# Patient Record
Sex: Female | Born: 1990 | Race: Black or African American | Hispanic: No | Marital: Single | Smoking: Never smoker
Health system: Southern US, Community
[De-identification: ages and names within clinical notes are randomized; demographics above are authoritative.]

## PROBLEM LIST (undated history)

## (undated) ENCOUNTER — Inpatient Hospital Stay (HOSPITAL_COMMUNITY): Payer: Self-pay

## (undated) DIAGNOSIS — B009 Herpesviral infection, unspecified: Secondary | ICD-10-CM

## (undated) DIAGNOSIS — N390 Urinary tract infection, site not specified: Secondary | ICD-10-CM

## (undated) DIAGNOSIS — M3500 Sicca syndrome, unspecified: Secondary | ICD-10-CM

## (undated) DIAGNOSIS — IMO0002 Reserved for concepts with insufficient information to code with codable children: Secondary | ICD-10-CM

## (undated) DIAGNOSIS — R87619 Unspecified abnormal cytological findings in specimens from cervix uteri: Secondary | ICD-10-CM

## (undated) DIAGNOSIS — D649 Anemia, unspecified: Secondary | ICD-10-CM

## (undated) DIAGNOSIS — N760 Acute vaginitis: Secondary | ICD-10-CM

## (undated) DIAGNOSIS — A599 Trichomoniasis, unspecified: Secondary | ICD-10-CM

## (undated) DIAGNOSIS — R7989 Other specified abnormal findings of blood chemistry: Secondary | ICD-10-CM

## (undated) DIAGNOSIS — S83519A Sprain of anterior cruciate ligament of unspecified knee, initial encounter: Secondary | ICD-10-CM

## (undated) DIAGNOSIS — B999 Unspecified infectious disease: Secondary | ICD-10-CM

## (undated) DIAGNOSIS — R87629 Unspecified abnormal cytological findings in specimens from vagina: Secondary | ICD-10-CM

## (undated) DIAGNOSIS — B9689 Other specified bacterial agents as the cause of diseases classified elsewhere: Secondary | ICD-10-CM

## (undated) HISTORY — DX: Other specified abnormal findings of blood chemistry: R79.89

## (undated) HISTORY — PX: COLPOSCOPY: SHX161

## (undated) HISTORY — DX: Reserved for concepts with insufficient information to code with codable children: IMO0002

## (undated) HISTORY — DX: Unspecified infectious disease: B99.9

## (undated) HISTORY — DX: Acute vaginitis: N76.0

## (undated) HISTORY — DX: Other specified bacterial agents as the cause of diseases classified elsewhere: B96.89

## (undated) HISTORY — DX: Urinary tract infection, site not specified: N39.0

## (undated) HISTORY — DX: Trichomoniasis, unspecified: A59.9

## (undated) HISTORY — DX: Unspecified abnormal cytological findings in specimens from cervix uteri: R87.619

## (undated) HISTORY — DX: Sprain of anterior cruciate ligament of unspecified knee, initial encounter: S83.519A

---
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
## 2010-03-03 ENCOUNTER — Emergency Department (HOSPITAL_COMMUNITY): Admission: EM | Admit: 2010-03-03 | Discharge: 2010-03-03 | Payer: Self-pay | Admitting: Family Medicine

## 2010-09-21 LAB — URINE CULTURE

## 2010-09-21 LAB — WET PREP, GENITAL: Yeast Wet Prep HPF POC: NONE SEEN

## 2010-09-21 LAB — POCT URINALYSIS DIPSTICK
Ketones, ur: NEGATIVE mg/dL
Nitrite: POSITIVE — AB
Protein, ur: NEGATIVE mg/dL
Specific Gravity, Urine: 1.015 (ref 1.005–1.030)
pH: 8.5 — ABNORMAL HIGH (ref 5.0–8.0)

## 2010-09-21 LAB — GC/CHLAMYDIA PROBE AMP, GENITAL: GC Probe Amp, Genital: NEGATIVE

## 2010-09-23 LAB — WET PREP, GENITAL
Clue Cells Wet Prep HPF POC: NONE SEEN
Yeast Wet Prep HPF POC: NONE SEEN

## 2010-09-23 LAB — POCT URINALYSIS DIP (DEVICE)
Glucose, UA: NEGATIVE mg/dL
Nitrite: NEGATIVE
Specific Gravity, Urine: 1.015 (ref 1.005–1.030)

## 2010-09-23 LAB — URINE CULTURE: Colony Count: 60000

## 2010-09-23 LAB — GC/CHLAMYDIA PROBE AMP, GENITAL
Chlamydia, DNA Probe: NEGATIVE
GC Probe Amp, Genital: NEGATIVE

## 2010-11-23 NOTE — Op Note (Signed)
Pamela Erickson, Pamela Erickson               ACCOUNT NO.:  [id number]   MEDICAL RECORD NO.:  [id number]          PATIENT TYPE:  AMB   LOCATION:  DSC                          FACILITY:  [facility]   PHYSICIAN:  Robert A. Thurston Hole, M.D. DATE OF BIRTH:  [birth year]   DATE OF PROCEDURE:  10/13/2006  DATE OF DISCHARGE:                               OPERATIVE REPORT   PREOPERATIVE DIAGNOSES:  1. Left knee anterior cruciate ligament tear.  2. Left knee partial lateral meniscal tear.   POSTOPERATIVE DIAGNOSES:  1. Left knee anterior cruciate ligament tear.  2. Left knee partial lateral meniscal tear.   PROCEDURES:  1. Left knee examination under anesthesia, followed by      arthroscopically assisted endoscopic bone-patellar tendon-bone      autograft anterior cruciate ligament reconstruction using 8 x 25-mm      femoral interference BioScrew and 8 x 25-mm tibial interference      BioScrew.  2. Left knee partial lateral meniscectomy.   SURGEON:  Elana Alm. Thurston Hole, M.D.   ASSISTANT:  Julien Girt, P.A.   ANESTHESIA:  General.   OPERATIVE TIME:  1 hour 15 minutes.   COMPLICATIONS:  None.   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  Significant pain with exam and MRI documenting ACL tear.  She is now to  undergo arthroscopy with ACL reconstruction.   DESCRIPTION:  Pamela Erickson was brought to the operating room on 10/13/06, after a  femoral nerve block was placed in the holding area by Anesthesia.  She  was placed on the operating table in supine position.  After being  placed under general anesthesia, her left knee was examined.  She had a  full range of motion, 2 to 3+ Lachman, positive pivot shift, knee stable  to varus, valgus and posterior stress, with normal patellar tracking.  The left knee was sterilely injected with 0.25% Marcaine with  epinephrine.  She received Ancef 1 g IV preoperatively  for prophylaxis.  The left leg was then prepped using sterile DuraPrep and draped using  sterile technique.  Originally, through an anterolateral portal, the  arthroscope with a pump attached was placed, and through an anteromedial  portal, an arthroscopic probe was placed.  On initial inspection of the  medial compartment, the articular cartilage was normal.  The medial  meniscus was normal.  The intercondylar notch was inspected.  The  anterior cruciate ligament was completely torn in its midsubstance with  significant anterior laxity, and this was thoroughly debrided and a  notchplasty was performed.  The posterior cruciate was intact and  stable.  The lateral compartment was inspected.  She had grade 1 and 2  chondromalacia.  The lateral meniscus showed a small posteromedial root  tear; 20% was resected back to a stable rim.  The rest of the lateral  meniscus was intact.  At the patellofemoral joint, the articular  cartilage was normal and the patella tracked normally.  The medial and  lateral gutters were free of pathology.  She  then underwent an ACL  autograft harvesting with a 4 to 5-cm longitudinal incision based over  the patellar tendon.  The underlying subcutaneous tissues were incised  along the skin incision.  The patellar tendon was exposed.  It measured  32 mm in width, and the central 10 mm was harvested with 9 x 25 mm of  patellar bone and tibial tubercle bone, and a 10-mm tendon graft.  After  this was done and while I was preparing the intra-articular portion of  the knee, Pamela Erickson, whose assistance was absolutely medically  necessary, was preparing the ACL graft on the back table to  significantly reduce the operative time and improve efficiency.  A 1.5-  cm anteromedial proximal tibial incision was made, and using a tibial  drill guide, a Steinmann pin was drilled up in the ACL insertion portion  on the tibial plateau with a 9-mm drill.  Through this hole,  the  posterior femoral guide was placed in the posterior femoral notch, and  then a Steinmann pin drilled up in the ACL origin point and then  overdrilled with a 9-mm drill to a depth of 30 mm, leaving a posterior 2-  mm bone bridge.  A double-pin passer was brought up through the tibial  tunnel and joint, and up through the femoral tunnel and through the  femoral cortex and thigh through a stab wound.  This was used to pass  the ACL graft, which had been prepared on the back table by [employer], up through the tibial tunnel, joint, and into the femoral  tunnel joint.  It was locked in position there with an 8 x 25-mm  interference BioScrew.  The knee was then brought through a full range  of motion and there was no impingement of the graft.  The tibial bone  plug was then locked into its tunnel with an 8 x 25-mm interference  BioScrew with the knee in 30 degrees of flexion and the tibia held  reduced on the femur.  After this was done, the knee was tested for  stability.  Lachman and pivot shift were found to be totally eliminated,  and the knee could be brought through a full range of motion with no  impingement of the graft.  At this point, it was felt that all pathology  had been satisfactorily addressed.  The patellar tendon defect was  closed loosely with a 2-0 Vicryl, subcutaneous tissue was closed with 0  and 2-0 Vicryl, subcuticular layer closed with 4-0 Monocryl, and the  arthroscopic portals were closed with 3-0 nylon.  The wound was injected  with 0.25% Marcaine with epinephrine.  Sterile dressings and a long-leg  splint were applied, and the patient was awakened and taken to the  recovery room in stable condition.   FOLLOWUP CARE:  Pamela Erickson will be followed [facility] for pain control, neurovascular monitoring and CPMs.  Discharged tomorrow on Percocet, and Robaxin when at home, CPM.  See me back in the  office in a week for sutures out and  followup.      Robert A. Thurston Hole, M.D.  Electronically Signed     RAW/MEDQ  D:  10/13/2006  T:  10/13/2006  Job:  [id number]

## 2011-04-05 LAB — WET PREP, GENITAL: Trich, Wet Prep: NONE SEEN

## 2011-04-15 LAB — POCT RAPID STREP A: Streptococcus, Group A Screen (Direct): NEGATIVE

## 2012-07-25 ENCOUNTER — Emergency Department (HOSPITAL_COMMUNITY)
Admission: EM | Admit: 2012-07-25 | Discharge: 2012-07-26 | Disposition: A | Discharge status: Home / Self Care | Payer: BC Managed Care – PPO | Attending: Emergency Medicine | Admitting: Emergency Medicine

## 2012-07-25 ENCOUNTER — Encounter (HOSPITAL_COMMUNITY): Payer: Self-pay | Admitting: *Deleted

## 2012-07-25 DIAGNOSIS — F17200 Nicotine dependence, unspecified, uncomplicated: Secondary | ICD-10-CM | POA: Insufficient documentation

## 2012-07-25 DIAGNOSIS — R079 Chest pain, unspecified: Secondary | ICD-10-CM

## 2012-07-25 DIAGNOSIS — R0602 Shortness of breath: Secondary | ICD-10-CM | POA: Insufficient documentation

## 2012-07-25 NOTE — ED Notes (Signed)
New EKG given to Dr Ignacia Palma. No old EKG.

## 2012-07-25 NOTE — ED Notes (Signed)
Patient reports she woke up today with this epigastric discomfort.  States laying down or leaning forward makes it worse.  Lungs clear bilaterally

## 2012-07-25 NOTE — ED Notes (Signed)
Chest tightness - substernal along with burning. Had spicy food - red hot chips.  Have to catch breath when bending over some.

## 2012-07-26 ENCOUNTER — Emergency Department (HOSPITAL_COMMUNITY): Payer: BC Managed Care – PPO

## 2012-07-26 LAB — POCT I-STAT, CHEM 8
Creatinine, Ser: 1.1 mg/dL (ref 0.50–1.10)
Glucose, Bld: 82 mg/dL (ref 70–99)
Hemoglobin: 13.3 g/dL (ref 12.0–15.0)

## 2012-07-26 MED ORDER — NAPROXEN 500 MG PO TABS: 500.0000 mg | ORAL_TABLET | Freq: Two times a day (BID) | ORAL | Status: DC

## 2012-07-26 MED ORDER — NAPROXEN 250 MG PO TABS
500.0000 mg | ORAL_TABLET | Freq: Once | ORAL | Status: AC
  Administered 2012-07-26: 500 mg via ORAL
  Filled 2012-07-26: qty 2

## 2012-07-26 NOTE — ED Provider Notes (Signed)
History     CSN: [id number]  Arrival date & time 07/25/12  2123   First MD Initiated Contact with Patient 07/25/12 2350      Chief Complaint  Patient presents with  . Shortness of Breath  . Chest Pain    (Consider location/radiation/quality/duration/timing/severity/associated sxs/prior treatment) HPI Comments: 22 year old female with no significant past medical history who presents with a complaint of one day of chest pain which is tightness, worse when she leans forward and associated with some shortness of breath.  She does have a recent car trip to [place] which took 5-6 hours and has also injured her left lower extremity on the calf muscle but does not remember how. She denies swelling of the legs, recent surgery, oral contraceptive use but she is a smoker. Her pain is intermittent throughout the day, seems to be worse when she leans forward, better when she lays back and not associated with fevers or coughing. She has no other medical problems, has no history of deep venous thrombosis and no family history of hypercoagulable state  Patient is a 22 y.o. female presenting with shortness of breath and chest pain. The history is provided by the patient.  Shortness of Breath  Associated symptoms include chest pain and shortness of breath.  Chest Pain Primary symptoms include shortness of breath.     History reviewed. No pertinent past medical history.  History reviewed. No pertinent past surgical history.  No family history on file.  History  Substance Use Topics  . Smoking status: Current Every Day Smoker  . Smokeless tobacco: Not on file  . Alcohol Use: Yes    OB History    Grav Para Term Preterm Abortions TAB SAB Ect Mult Living                  Review of Systems  Respiratory: Positive for shortness of breath.   Cardiovascular: Positive for chest pain.  All other systems reviewed and are negative.    Allergies  Review of patient's allergies indicates no known  allergies.  Home Medications   Current Outpatient Rx  Name  Route  Sig  Dispense  Refill  . NAPROXEN 500 MG PO TABS   Oral   Take 1 tablet (500 mg total) by mouth 2 (two) times daily with a meal.   30 tablet   0     BP 125/79  Pulse 101  Temp 98.2 F (36.8 C) (Oral)  Resp 25  SpO2 100%  LMP 07/08/2012  Physical Exam  Nursing note and vitals reviewed. Constitutional: She appears well-developed and well-nourished. No distress.  HENT:  Head: Normocephalic and atraumatic.  Mouth/Throat: Oropharynx is clear and moist. No oropharyngeal exudate.  Eyes: Conjunctivae normal and EOM are normal. Pupils are equal, round, and reactive to light. Right eye exhibits no discharge. Left eye exhibits no discharge. No scleral icterus.  Neck: Normal range of motion. Neck supple. No JVD present. No thyromegaly present.  Cardiovascular: Normal rate, regular rhythm, normal heart sounds and intact distal pulses.  Exam reveals no gallop and no friction rub.   No murmur heard. Pulmonary/Chest: Effort normal and breath sounds normal. No respiratory distress. She has no wheezes. She has no rales.  Abdominal: Soft. Bowel sounds are normal. She exhibits no distension and no mass. There is no tenderness.  Musculoskeletal: Normal range of motion. She exhibits tenderness ( Mild tenderness to the left medial lower There is a single bruise). She exhibits no edema.  No asymmetry, no edema of the lower extremities  Lymphadenopathy:    She has no cervical adenopathy.  Neurological: She is alert. Coordination normal.  Skin: Skin is warm and dry. No rash noted. No erythema.  Psychiatric: She has a normal mood and affect. Her behavior is normal.    ED Course  Procedures (including critical care time)   Labs Reviewed  D-DIMER, QUANTITATIVE  POCT I-STAT, CHEM 8   Dg Chest 2 View  07/26/2012  *RADIOLOGY REPORT*  Clinical Data: Shortness of breath  CHEST - 2 VIEW  Comparison: None.  Findings: Shallow  inspiration with some elevation of the left hemidiaphragm. The heart size and pulmonary vascularity are normal. The lungs appear clear and expanded without focal air space disease or consolidation. No blunting of the costophrenic angles.  No pneumothorax.  Mediastinal contours appear intact.  IMPRESSION: No evidence of active pulmonary disease.   Original Report Authenticated By: Burman Nieves, M.D.      1. Chest pain       MDM  The patient's pulse is approximately 100, she has recent travel, recent injury to the left lower extremity and warns a d-dimer to rule out pulmonary embolism though less likely given the patient's overall well appearance. There is no signs of right heart strain clinically nor on EKG, chest x-ray pending.  ED ECG REPORT  I personally interpreted this EKG   Date: 07/26/2012   Rate: 100  Rhythm: normal sinus rhythm  QRS Axis: normal  Intervals: normal  ST/T Wave abnormalities: nonspecific T wave changes  Conduction Disutrbances:none  Narrative Interpretation:   Old EKG Reviewed: none available  At this time the patient is well-appearing, chest x-ray was negative for any acute findings and a d-dimer was normal.        Vida Roller, MD 07/26/12 [phone]

## 2012-09-21 ENCOUNTER — Ambulatory Visit: Payer: BC Managed Care – PPO | Admitting: Obstetrics and Gynecology

## 2012-09-21 LAB — POCT URINALYSIS DIPSTICK
Blood, UA: NEGATIVE
Ketones, UA: NEGATIVE
Spec Grav, UA: 1.015
Urobilinogen, UA: NEGATIVE
pH, UA: 7

## 2012-09-21 NOTE — Progress Notes (Unsigned)
NOB INTERVIEW. Pt given information regarding 1st trimester screen and to call to schedule with NOB W/U  if desires. NOB W/u with Alvino Chapel 10/02/12

## 2012-09-22 LAB — PRENATAL PANEL VII
Antibody Screen: NEGATIVE
Basophils Absolute: 0 10*3/uL (ref 0.0–0.1)
Basophils Relative: 0 % (ref 0–1)
Eosinophils Relative: 0 % (ref 0–5)
Hemoglobin: 11.8 g/dL — ABNORMAL LOW (ref 12.0–15.0)
Lymphocytes Relative: 19 % (ref 12–46)
Lymphs Abs: 1.1 10*3/uL (ref 0.7–4.0)
MCHC: 33.9 g/dL (ref 30.0–36.0)
Monocytes Absolute: 0.7 10*3/uL (ref 0.1–1.0)
Monocytes Relative: 13 % — ABNORMAL HIGH (ref 3–12)
Neutrophils Relative %: 68 % (ref 43–77)
Platelets: 472 10*3/uL — ABNORMAL HIGH (ref 150–400)
RBC: 4.56 MIL/uL (ref 3.87–5.11)
RDW: 14.8 % (ref 11.5–15.5)
Rubella: 1.69 Index — ABNORMAL HIGH (ref <0.90)

## 2012-09-23 LAB — HEMOGLOBINOPATHY EVALUATION
Hemoglobin Other: 0 %
Hgb S Quant: 34.9 % — ABNORMAL HIGH

## 2012-09-24 LAB — CULTURE, OB URINE

## 2012-10-07 ENCOUNTER — Inpatient Hospital Stay (HOSPITAL_COMMUNITY)
Admission: AD | Admit: 2012-10-07 | Discharge: 2012-10-07 | Disposition: A | Discharge status: Home / Self Care | Payer: BC Managed Care – PPO | Source: Ambulatory Visit | Attending: Obstetrics and Gynecology | Admitting: Obstetrics and Gynecology

## 2012-10-07 ENCOUNTER — Inpatient Hospital Stay (HOSPITAL_COMMUNITY): Payer: BC Managed Care – PPO

## 2012-10-07 ENCOUNTER — Encounter (HOSPITAL_COMMUNITY): Payer: Self-pay | Admitting: *Deleted

## 2012-10-07 DIAGNOSIS — O209 Hemorrhage in early pregnancy, unspecified: Secondary | ICD-10-CM | POA: Diagnosis present

## 2012-10-07 DIAGNOSIS — A6000 Herpesviral infection of urogenital system, unspecified: Secondary | ICD-10-CM | POA: Diagnosis present

## 2012-10-07 DIAGNOSIS — O98519 Other viral diseases complicating pregnancy, unspecified trimester: Secondary | ICD-10-CM | POA: Insufficient documentation

## 2012-10-07 HISTORY — DX: Herpesviral infection, unspecified: B00.9

## 2012-10-07 HISTORY — DX: Herpesviral infection of urogenital system, unspecified: A60.00

## 2012-10-07 LAB — WET PREP, GENITAL
Trich, Wet Prep: NONE SEEN
Yeast Wet Prep HPF POC: NONE SEEN

## 2012-10-07 LAB — URINE MICROSCOPIC-ADD ON

## 2012-10-07 LAB — URINALYSIS, ROUTINE W REFLEX MICROSCOPIC
Glucose, UA: NEGATIVE mg/dL
Nitrite: NEGATIVE
Urobilinogen, UA: 0.2 mg/dL (ref 0.0–1.0)
pH: 6.5 (ref 5.0–8.0)

## 2012-10-07 MED ORDER — VALACYCLOVIR HCL 500 MG PO TABS: 500.0000 mg | ORAL_TABLET | Freq: Two times a day (BID) | ORAL | Status: AC

## 2012-10-07 NOTE — MAU Note (Signed)
Last intercourse was 6 days ago; denies any pain and noted some spotting when she wiped this AM; has not been on meds for Herpes and is having an outbreak now- last outbreak was about a year ago;

## 2012-10-07 NOTE — MAU Note (Signed)
C/o spotting this AM and having a herpes outbreak;

## 2012-10-07 NOTE — MAU Provider Note (Signed)
History   22 yo G1P0 at 49 4/7 weeks presented unannounced c/o BRB this am after voiding--denies pain, no recent IC. Also thinks she has a current HSV outbreak, but having no pain.  Visible lesions noted since Sunday.  Hx of frequent outbreaks after initial dx several years ago, then diminished to occasional.  This is first outbreak with pregnancy.  Had NOB interview at [facility], with GC/chlamydia pending from urine.  Had NOB appt scheduled XXX, but did not keep due to insurance issues.  Those issues have now been resolved--needs to make another appt for NOB w/u.  A+ type from NOB labs.   Chief Complaint  Patient presents with  . Vaginal Bleeding     OB History   Grav Para Term Preterm Abortions TAB SAB Ect Mult Living   1               Past Medical History  Diagnosis Date  . Abnormal Pap smear     last pap 01/2012  . 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 Torn ACL (anterior cruciate ligament) 2008  . Herpes     Past Surgical History  Procedure Laterality Date  . Anterior cruciate ligament repair  2008    Family History  Problem Relation Age of Onset  . Hypertension Mother   . Hypertension Father   . Diabetes Maternal Aunt     History  Substance Use Topics  . Smoking status: Never Smoker   . Smokeless tobacco: Never Used  . Alcohol Use: Yes     Comment: OCC    Allergies: No Known Allergies  Prescriptions prior to admission  Medication Sig Dispense Refill  . Prenatal Vit-Fe Fumarate-FA (PRENATAL PO) Take 1 each by mouth daily. gummie        Physical Exam   Blood pressure 140/65, pulse 92, temperature 98.8 F (37.1 C), temperature source Oral, weight 198 lb 12.8 oz (90.175 kg), last menstrual period 07/11/2012.  Chest clear Heart RRR without murmur Abd gravid, NT, FH 12-13 weeks Pelvic--small ulceration on right labia, NT.  Small amount pink d/c in vault, no active bleeding.  Cervix closed, long, NT. Ext WNL  FHR 160 by doppler  ED Course  IUP at 12  4/7 weeks 1st trimester bleeding HSV outbreak  Plan: Wet prep [place] for dating/evaluation of bleeding Reviewed PN labs with patient. Anticipate d/c after [place], with bleeding precautions.   Nigel Bridgeman CNM, [place] 10/07/2012 9:39 AM   Addendum: Returned from [place]:  12 4/7 weeks, no evidence SCH.  No further bleeding. No pain.  Results for orders placed during the hospital encounter of 10/07/12 (from the past 24 hour(s))  URINALYSIS, ROUTINE W REFLEX MICROSCOPIC     Status: Abnormal   Collection Time    10/07/12  8:36 AM      Result Value Range   Color, Urine YELLOW  YELLOW   APPearance CLEAR  CLEAR   Specific Gravity, Urine 1.015  1.005 - 1.030   pH 6.5  5.0 - 8.0   Glucose, UA NEGATIVE  NEGATIVE mg/dL   Hgb urine dipstick MODERATE (*) NEGATIVE   Bilirubin Urine NEGATIVE  NEGATIVE   Ketones, ur NEGATIVE  NEGATIVE mg/dL   Protein, ur NEGATIVE  NEGATIVE mg/dL   Urobilinogen, UA 0.2  0.0 - 1.0 mg/dL   Nitrite NEGATIVE  NEGATIVE   Leukocytes, UA MODERATE (*) NEGATIVE  URINE MICROSCOPIC-ADD ON     Status: Abnormal   Collection Time  10/07/12  8:36 AM      Result Value Range   Squamous Epithelial / LPF RARE  RARE   WBC, UA 3-6  <3 WBC/hpf   Bacteria, UA FEW (*) RARE  WET PREP, GENITAL     Status: Abnormal   Collection Time    10/07/12  9:42 AM      Result Value Range   Yeast Wet Prep HPF POC NONE SEEN  NONE SEEN   Trich, Wet Prep NONE SEEN  NONE SEEN   Clue Cells Wet Prep HPF POC FEW (*) NONE SEEN   WBC, Wet Prep HPF POC FEW (*) NONE SEEN     D/C's home with bleeding precautions. Needs to schedule appt for NOB w/u--will have office call her to schedule it with me, if possible.  Nigel Bridgeman, CNM 10/07/12 10a

## 2012-10-08 LAB — URINE CULTURE

## 2013-03-05 ENCOUNTER — Encounter (HOSPITAL_COMMUNITY): Payer: Self-pay | Admitting: Emergency Medicine

## 2013-03-05 ENCOUNTER — Emergency Department (INDEPENDENT_AMBULATORY_CARE_PROVIDER_SITE_OTHER)
Admission: EM | Admit: 2013-03-05 | Discharge: 2013-03-05 | Disposition: A | Discharge status: Home / Self Care | Payer: BC Managed Care – PPO | Source: Home / Self Care | Attending: Emergency Medicine | Admitting: Emergency Medicine

## 2013-03-05 DIAGNOSIS — B370 Candidal stomatitis: Secondary | ICD-10-CM

## 2013-03-05 MED ORDER — NYSTATIN 100000 UNIT/ML MT SUSP: 500000.0000 [IU] | Freq: Four times a day (QID) | OROMUCOSAL | Status: AC

## 2013-03-05 NOTE — ED Provider Notes (Addendum)
CSN: [id number]     Arrival date & time 03/05/13  1432 History   First MD Initiated Contact with Patient 03/05/13 1530     Chief Complaint  Patient presents with  . Mouth Lesions   (Consider location/radiation/quality/duration/timing/severity/associated sxs/prior Treatment) HPI Comments: Patient presents urgent care complaining of 2-3 white patches on her tongue some of them have subsided some but she still has a 1 circular shaped and the tip of her tongue with white and raised edges. She denies any pain or tongue trauma or injury she is not a smoker but she is pregnant.  Patient is a 22 y.o. female presenting with mouth sores. The history is provided by the patient.  Mouth Lesions Location:  Tongue Quality:  White Onset quality:  Gradual Severity:  Mild Progression:  Worsening Relieved by:  Nothing Worsened by:  Nothing tried Associated symptoms: no fever and no rash     Past Medical History  Diagnosis Date  . Abnormal Pap smear     last pap 01/2012  . 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 Torn ACL (anterior cruciate ligament) 2008  . Herpes    Past Surgical History  Procedure Laterality Date  . Anterior cruciate ligament repair  2008   Family History  Problem Relation Age of Onset  . Hypertension Mother   . Hypertension Father   . Diabetes Maternal Aunt    History  Substance Use Topics  . Smoking status: Never Smoker   . Smokeless tobacco: Never Used  . Alcohol Use: Yes     Comment: OCC   OB History   Grav Para Term Preterm Abortions TAB SAB Ect Mult Living   1              Review of Systems  Constitutional: Negative for fever, chills, diaphoresis, activity change, appetite change, fatigue and unexpected weight change.  HENT: Positive for mouth sores.   Eyes: Negative for discharge and itching.  Skin: Negative for color change, pallor, rash and wound.    Allergies  Review of patient's allergies indicates no known allergies.  Home Medications    Current Outpatient Rx  Name  Route  Sig  Dispense  Refill  . Prenatal Vitamins (DIS) TABS   Oral   Take by mouth.         . Prenatal Vit-Fe Fumarate-FA (PRENATAL PO)   Oral   Take 1 each by mouth daily. gummie          BP 110/69  Pulse 102  Temp(Src) 98.5 F (36.9 C) (Oral)  Resp 22  SpO2 99%  LMP 07/11/2012 Physical Exam  Nursing note and vitals reviewed. Constitutional: She appears well-developed and well-nourished.  HENT:  Head: Normocephalic and atraumatic.  Mouth/Throat: No oropharyngeal exudate.    Eyes: Conjunctivae are normal.  Neck: No JVD present.  Skin: No rash noted. No erythema.    ED Course  Procedures (including critical care time) Labs Review Labs Reviewed - No data to display Imaging Review No results found.  MDM  No diagnosis found. Oral candidiasis patient will be treated with nystatin suspension.    Jimmie Molly, MD 03/05/13 1607  Jimmie Molly, MD 03/05/13 [phone]

## 2013-03-05 NOTE — ED Notes (Signed)
Patient reports white patches on tongue.  Visualized slightly white circular area to tip of tongue.  Denies any pain or other concerns.  Patient is [redacted] weeks pregnant-dr stringer.

## 2013-03-22 LAB — OB RESULTS CONSOLE GC/CHLAMYDIA: Gonorrhea: NEGATIVE

## 2013-03-22 LAB — OB RESULTS CONSOLE GBS: GBS: NEGATIVE

## 2013-04-20 ENCOUNTER — Telehealth (HOSPITAL_COMMUNITY): Payer: Self-pay | Admitting: *Deleted

## 2013-04-20 ENCOUNTER — Encounter (HOSPITAL_COMMUNITY): Payer: Self-pay | Admitting: *Deleted

## 2013-04-20 NOTE — Telephone Encounter (Signed)
Preadmission screen  

## 2013-04-24 ENCOUNTER — Encounter (HOSPITAL_COMMUNITY): Payer: Self-pay

## 2013-04-24 ENCOUNTER — Inpatient Hospital Stay (HOSPITAL_COMMUNITY)
Admission: RE | Admit: 2013-04-24 | Discharge: 2013-04-28 | DRG: 766 | Disposition: A | Discharge status: Home / Self Care | Payer: BC Managed Care – PPO | Source: Ambulatory Visit | Attending: Obstetrics and Gynecology | Admitting: Obstetrics and Gynecology

## 2013-04-24 VITALS — BP 127/74 | HR 105 | Temp 97.7°F | Resp 18 | Ht 62.0 in | Wt 225.0 lb

## 2013-04-24 DIAGNOSIS — O480 Post-term pregnancy: Principal | ICD-10-CM | POA: Diagnosis present

## 2013-04-24 DIAGNOSIS — Z98891 History of uterine scar from previous surgery: Secondary | ICD-10-CM

## 2013-04-24 HISTORY — DX: Anemia, unspecified: D64.9

## 2013-04-24 LAB — TYPE AND SCREEN: Antibody Screen: NEGATIVE

## 2013-04-24 LAB — CBC
HCT: 33.7 % — ABNORMAL LOW (ref 36.0–46.0)
Hemoglobin: 11.6 g/dL — ABNORMAL LOW (ref 12.0–15.0)
MCV: 75.2 fL — ABNORMAL LOW (ref 78.0–100.0)
RDW: 14.8 % (ref 11.5–15.5)
WBC: 6.1 10*3/uL (ref 4.0–10.5)

## 2013-04-24 MED ORDER — LIDOCAINE HCL (PF) 1 % IJ SOLN
30.0000 mL | INTRAMUSCULAR | Status: DC | PRN
  Filled 2013-04-24: qty 30

## 2013-04-24 MED ORDER — OXYTOCIN BOLUS FROM INFUSION: 500.0000 mL | INTRAVENOUS | Status: DC

## 2013-04-24 MED ORDER — OXYCODONE-ACETAMINOPHEN 5-325 MG PO TABS: 1.0000 | ORAL_TABLET | ORAL | Status: DC | PRN

## 2013-04-24 MED ORDER — ACETAMINOPHEN 325 MG PO TABS: 650.0000 mg | ORAL_TABLET | ORAL | Status: DC | PRN

## 2013-04-24 MED ORDER — ZOLPIDEM TARTRATE 5 MG PO TABS
5.0000 mg | ORAL_TABLET | Freq: Every evening | ORAL | Status: DC | PRN
  Administered 2013-04-24: 5 mg via ORAL
  Filled 2013-04-24: qty 1

## 2013-04-24 MED ORDER — IBUPROFEN 600 MG PO TABS: 600.0000 mg | ORAL_TABLET | Freq: Four times a day (QID) | ORAL | Status: DC | PRN

## 2013-04-24 MED ORDER — MISOPROSTOL 25 MCG QUARTER TABLET
25.0000 ug | ORAL_TABLET | ORAL | Status: DC | PRN
  Administered 2013-04-24 – 2013-04-25 (×2): 25 ug via VAGINAL
  Filled 2013-04-24 (×2): qty 0.25
  Filled 2013-04-24: qty 1

## 2013-04-24 MED ORDER — FLEET ENEMA 7-19 GM/118ML RE ENEM: 1.0000 | ENEMA | RECTAL | Status: DC | PRN

## 2013-04-24 MED ORDER — NALBUPHINE SYRINGE 5 MG/0.5 ML
5.0000 mg | INJECTION | INTRAMUSCULAR | Status: DC | PRN
  Administered 2013-04-25: 5 mg via INTRAVENOUS
  Filled 2013-04-24 (×2): qty 0.5

## 2013-04-24 MED ORDER — TERBUTALINE SULFATE 1 MG/ML IJ SOLN: 0.2500 mg | Freq: Once | INTRAMUSCULAR | Status: AC | PRN

## 2013-04-24 MED ORDER — ONDANSETRON HCL 4 MG/2ML IJ SOLN: 4.0000 mg | Freq: Four times a day (QID) | INTRAMUSCULAR | Status: DC | PRN

## 2013-04-24 MED ORDER — LACTATED RINGERS IV SOLN
500.0000 mL | INTRAVENOUS | Status: DC | PRN
  Administered 2013-04-25: 500 mL via INTRAVENOUS

## 2013-04-24 MED ORDER — LACTATED RINGERS IV SOLN
INTRAVENOUS | Status: DC
  Administered 2013-04-25 (×3): via INTRAVENOUS

## 2013-04-24 MED ORDER — OXYTOCIN 40 UNITS IN LACTATED RINGERS INFUSION - SIMPLE MED: 62.5000 mL/h | INTRAVENOUS | Status: DC

## 2013-04-24 MED ORDER — CITRIC ACID-SODIUM CITRATE 334-500 MG/5ML PO SOLN
30.0000 mL | ORAL | Status: DC | PRN
  Administered 2013-04-25: 30 mL via ORAL
  Filled 2013-04-24: qty 15

## 2013-04-24 MED ORDER — OXYTOCIN 40 UNITS IN LACTATED RINGERS INFUSION - SIMPLE MED
1.0000 m[IU]/min | INTRAVENOUS | Status: DC
  Administered 2013-04-25: 2 m[IU]/min via INTRAVENOUS
  Filled 2013-04-24: qty 1000

## 2013-04-24 NOTE — Progress Notes (Signed)
Pulse oximeter removed, FHR tracing without difficulty

## 2013-04-24 NOTE — H&P (Signed)
Pamela Erickson is a 22 y.o. female presenting for induction of labor at [redacted]w[redacted]d due to post dates. History OB History   Grav Para Term Preterm Abortions TAB SAB Ect Mult Living   1              History of Present Pregnancy:   Pt entered care at 10w 2d.  EDC confirmed by [place].  Pt with neg quad screen and anatomy ultrasound without abnormalities identified.  Pt with nml 1hr GTT.  Ultrasound obtained at 34wks due to size greater than dates with EFW at 63%ile and normal amniotic fluid volume.  On Valtrex proplylaxis starting at 36wks and denies any recent outbreaks.    Past Medical History  Diagnosis Date  . Abnormal Pap smear     last pap 01/2012  . 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 Torn ACL (anterior cruciate ligament) 2008  . Herpes    Past Surgical History  Procedure Laterality Date  . Anterior cruciate ligament repair  2008  . No past surgeries     Family History: family history includes Diabetes in her maternal aunt; Hypertension in her father and mother. Social History:  reports that she has never smoked. She has never used smokeless tobacco. She reports that she drinks alcohol. She reports that she uses illicit drugs.   Prenatal Transfer Tool  Maternal Diabetes: No Genetic Screening: Normal Maternal Ultrasounds/Referrals: Normal Fetal Ultrasounds or other Referrals:  None Maternal Substance Abuse:  No Significant Maternal Medications:  None Significant Maternal Lab Results:  None Other Comments:  None  Review of Systems  Constitutional: Negative.   HENT: Negative.   Eyes: Negative.   Respiratory: Negative.   Cardiovascular: Negative.   Gastrointestinal: Negative.   Genitourinary: Negative.   Musculoskeletal: Negative.   Skin: Negative.   Neurological: Negative.   Endo/Heme/Allergies: Negative.   Psychiatric/Behavioral: Negative.     Dilation: 1.5 Effacement (%): 40 Station: -3 Exam by:: Conni Elliot, CNM Blood pressure 130/76, pulse 101, temperature 97.8  F (36.6 C), temperature source Oral, resp. rate 20, height 5\' 2"  (1.575 m), weight 102.059 kg (225 lb), last menstrual period 07/11/2012. Maternal Exam:  Uterine Assessment: Contraction strength is mild.  Contraction frequency is irregular.   Abdomen: Patient reports no abdominal tenderness. Fundal height is 40.   Estimated fetal weight is 7.5.   Fetal presentation: vertex  Introitus: Normal vulva. Vulva is negative for lesion.  Normal vagina.  Ferning test: not done.  Nitrazine test: not done. Amniotic fluid character: not assessed.  Pelvis: adequate for delivery.   Cervix: Cervix evaluated by sterile speculum exam and digital exam.     Fetal Exam Fetal Monitor Review: Mode: ultrasound.   Baseline rate: 155.  Variability: moderate (6-25 bpm).   Pattern: accelerations present and no decelerations.    Fetal State Assessment: Category I - tracings are normal.     Physical Exam  Constitutional: She is oriented to person, place, and time. She appears well-developed and well-nourished.  HENT:  Head: Normocephalic and atraumatic.  Right Ear: External ear normal.  Left Ear: External ear normal.  Nose: Nose normal.  Eyes: Conjunctivae are normal. Pupils are equal, round, and reactive to light.  Neck: Normal range of motion. Neck supple.  Cardiovascular: Normal rate, regular rhythm and intact distal pulses.   Respiratory: Effort normal and breath sounds normal.  GI: Soft. Bowel sounds are normal.  Gravid  Genitourinary: Vagina normal and uterus normal. Vulva exhibits no lesion.  Speculum exam with no evid of HSV lesions noted of ext genitalia or vagina.  Musculoskeletal: Normal range of motion.  Neurological: She is alert and oriented to person, place, and time. She has normal reflexes.  Skin: Skin is warm and dry.  Psychiatric: She has a normal mood and affect. Her behavior is normal.    Prenatal labs: ABO, Rh: A/POS/-- (03/17 1406) Antibody: NEG (03/17 1406) Rubella:  1.69 (03/17 1406) RPR: NON REAC (03/17 1406)  HBsAg: NEGATIVE (03/17 1406)  HIV: NON REACTIVE (03/17 1406)  GBS: Negative (09/15 0000)   Assessment/Plan: IUP at [redacted]w[redacted]d  Admit to [facility] per consult with Dr. Normand Sloop. RBA cytotec followed by pitocin d/w pt.  Pt agrees to proceed with cytotec tonight.   Will begin Pitocin in AM.     Kennady Zimmerle O. 04/24/2013, 8:46 PM

## 2013-04-24 NOTE — Progress Notes (Signed)
Maternal pulse tracing from 2241-2246.  Pulse oximeter applied to differentiate maternal vs fetal pulse.  [place] readjusted

## 2013-04-24 NOTE — Progress Notes (Signed)
Conni Elliot , CNM into perform speculum exam prior to initiating IOL secondary pt hx HSV

## 2013-04-25 ENCOUNTER — Encounter (HOSPITAL_COMMUNITY): Payer: Self-pay

## 2013-04-25 ENCOUNTER — Inpatient Hospital Stay (HOSPITAL_COMMUNITY): Payer: BC Managed Care – PPO | Admitting: Anesthesiology

## 2013-04-25 ENCOUNTER — Encounter (HOSPITAL_COMMUNITY): Payer: BC Managed Care – PPO | Admitting: Anesthesiology

## 2013-04-25 ENCOUNTER — Encounter (HOSPITAL_COMMUNITY)
Admission: RE | Disposition: A | Discharge status: Home / Self Care | Payer: Self-pay | Source: Ambulatory Visit | Attending: Obstetrics and Gynecology

## 2013-04-25 SURGERY — Surgical Case
Anesthesia: Epidural | Site: Abdomen | Wound class: Clean Contaminated

## 2013-04-25 MED ORDER — EPHEDRINE 5 MG/ML INJ
10.0000 mg | INTRAVENOUS | Status: DC | PRN
  Filled 2013-04-25: qty 2

## 2013-04-25 MED ORDER — MORPHINE SULFATE 0.5 MG/ML IJ SOLN
INTRAMUSCULAR | Status: AC
  Filled 2013-04-25: qty 10

## 2013-04-25 MED ORDER — LACTATED RINGERS IV SOLN
INTRAVENOUS | Status: DC | PRN
  Administered 2013-04-25: 20:00:00 via INTRAVENOUS

## 2013-04-25 MED ORDER — ONDANSETRON HCL 4 MG/2ML IJ SOLN
INTRAMUSCULAR | Status: DC | PRN
  Administered 2013-04-25: 4 mg via INTRAMUSCULAR

## 2013-04-25 MED ORDER — ONDANSETRON HCL 4 MG/2ML IJ SOLN
INTRAMUSCULAR | Status: AC
  Filled 2013-04-25: qty 2

## 2013-04-25 MED ORDER — LIDOCAINE HCL (PF) 1 % IJ SOLN
INTRAMUSCULAR | Status: DC | PRN
  Administered 2013-04-25 (×2): 5 mL

## 2013-04-25 MED ORDER — ACETAMINOPHEN 325 MG PO TABS
650.0000 mg | ORAL_TABLET | Freq: Once | ORAL | Status: AC
  Administered 2013-04-25: 650 mg via ORAL

## 2013-04-25 MED ORDER — LACTATED RINGERS IV SOLN
INTRAVENOUS | Status: DC
  Administered 2013-04-25: 18:00:00 via INTRAUTERINE

## 2013-04-25 MED ORDER — SCOPOLAMINE 1 MG/3DAYS TD PT72
MEDICATED_PATCH | TRANSDERMAL | Status: AC
  Filled 2013-04-25: qty 1

## 2013-04-25 MED ORDER — LIDOCAINE-EPINEPHRINE (PF) 2 %-1:200000 IJ SOLN
INTRAMUSCULAR | Status: AC
  Filled 2013-04-25: qty 20

## 2013-04-25 MED ORDER — METOCLOPRAMIDE HCL 5 MG/ML IJ SOLN: 10.0000 mg | Freq: Once | INTRAMUSCULAR | Status: AC | PRN

## 2013-04-25 MED ORDER — DIPHENHYDRAMINE HCL 50 MG/ML IJ SOLN: 12.5000 mg | INTRAMUSCULAR | Status: DC | PRN

## 2013-04-25 MED ORDER — MEPERIDINE HCL 25 MG/ML IJ SOLN: 6.2500 mg | INTRAMUSCULAR | Status: DC | PRN

## 2013-04-25 MED ORDER — OXYTOCIN 10 UNIT/ML IJ SOLN
INTRAMUSCULAR | Status: AC
  Filled 2013-04-25: qty 4

## 2013-04-25 MED ORDER — MEPERIDINE HCL 25 MG/ML IJ SOLN
INTRAMUSCULAR | Status: DC | PRN
  Administered 2013-04-25 (×2): 12.5 mg via INTRAVENOUS

## 2013-04-25 MED ORDER — LACTATED RINGERS IV SOLN
INTRAVENOUS | Status: DC | PRN
  Administered 2013-04-25 (×3): via INTRAVENOUS

## 2013-04-25 MED ORDER — MEPERIDINE HCL 25 MG/ML IJ SOLN
INTRAMUSCULAR | Status: AC
  Filled 2013-04-25: qty 1

## 2013-04-25 MED ORDER — KETOROLAC TROMETHAMINE 30 MG/ML IJ SOLN: 30.0000 mg | Freq: Four times a day (QID) | INTRAMUSCULAR | Status: AC | PRN

## 2013-04-25 MED ORDER — MORPHINE SULFATE (PF) 0.5 MG/ML IJ SOLN
INTRAMUSCULAR | Status: DC | PRN
  Administered 2013-04-25: 4 mg via EPIDURAL

## 2013-04-25 MED ORDER — FENTANYL 2.5 MCG/ML BUPIVACAINE 1/10 % EPIDURAL INFUSION (WH - ANES)
INTRAMUSCULAR | Status: AC
  Filled 2013-04-25: qty 125

## 2013-04-25 MED ORDER — SODIUM BICARBONATE 8.4 % IV SOLN
INTRAVENOUS | Status: DC | PRN
  Administered 2013-04-25: 5 mL via EPIDURAL
  Administered 2013-04-25: 2 mL via EPIDURAL
  Administered 2013-04-25: 5 mL via EPIDURAL
  Administered 2013-04-25: 3 mL via EPIDURAL
  Administered 2013-04-25: 5 mL via EPIDURAL

## 2013-04-25 MED ORDER — KETOROLAC TROMETHAMINE 30 MG/ML IJ SOLN
30.0000 mg | Freq: Four times a day (QID) | INTRAMUSCULAR | Status: AC | PRN
  Administered 2013-04-25: 30 mg via INTRAVENOUS
  Filled 2013-04-25: qty 1

## 2013-04-25 MED ORDER — LACTATED RINGERS IV SOLN: 500.0000 mL | Freq: Once | INTRAVENOUS | Status: DC

## 2013-04-25 MED ORDER — 0.9 % SODIUM CHLORIDE (POUR BTL) OPTIME
TOPICAL | Status: DC | PRN
  Administered 2013-04-25: 1000 mL

## 2013-04-25 MED ORDER — MORPHINE SULFATE (PF) 0.5 MG/ML IJ SOLN
INTRAMUSCULAR | Status: DC | PRN
  Administered 2013-04-25: 1 mg via INTRAVENOUS

## 2013-04-25 MED ORDER — SCOPOLAMINE 1 MG/3DAYS TD PT72
1.0000 | MEDICATED_PATCH | Freq: Once | TRANSDERMAL | Status: DC
  Administered 2013-04-25: 1.5 mg via TRANSDERMAL
  Filled 2013-04-25: qty 1

## 2013-04-25 MED ORDER — KETOROLAC TROMETHAMINE 30 MG/ML IJ SOLN
INTRAMUSCULAR | Status: AC
  Administered 2013-04-26: 30 mg
  Filled 2013-04-25: qty 1

## 2013-04-25 MED ORDER — PHENYLEPHRINE 40 MCG/ML (10ML) SYRINGE FOR IV PUSH (FOR BLOOD PRESSURE SUPPORT)
80.0000 ug | PREFILLED_SYRINGE | INTRAVENOUS | Status: DC | PRN
  Filled 2013-04-25: qty 2

## 2013-04-25 MED ORDER — FENTANYL CITRATE 0.05 MG/ML IJ SOLN: 25.0000 ug | INTRAMUSCULAR | Status: DC | PRN

## 2013-04-25 MED ORDER — SODIUM CHLORIDE 0.9 % IV SOLN
3.0000 g | Freq: Four times a day (QID) | INTRAVENOUS | Status: DC
  Administered 2013-04-25 – 2013-04-27 (×6): 3 g via INTRAVENOUS
  Filled 2013-04-25 (×7): qty 3

## 2013-04-25 MED ORDER — LACTATED RINGERS IV SOLN
INTRAVENOUS | Status: DC
  Administered 2013-04-25: 125 mL/h via INTRAVENOUS

## 2013-04-25 MED ORDER — OXYTOCIN 10 UNIT/ML IJ SOLN
40.0000 [IU] | INTRAVENOUS | Status: DC | PRN
  Administered 2013-04-25: 40 [IU] via INTRAVENOUS

## 2013-04-25 MED ORDER — FENTANYL 2.5 MCG/ML BUPIVACAINE 1/10 % EPIDURAL INFUSION (WH - ANES)
14.0000 mL/h | INTRAMUSCULAR | Status: DC | PRN
  Administered 2013-04-25 (×2): 14 mL/h via EPIDURAL
  Filled 2013-04-25: qty 125

## 2013-04-25 MED ORDER — CEFAZOLIN SODIUM-DEXTROSE 2-3 GM-% IV SOLR
INTRAVENOUS | Status: DC | PRN
  Administered 2013-04-25: 2 g via INTRAVENOUS

## 2013-04-25 MED ORDER — EPHEDRINE 5 MG/ML INJ
INTRAVENOUS | Status: AC
  Filled 2013-04-25: qty 4

## 2013-04-25 MED ORDER — CEFAZOLIN SODIUM-DEXTROSE 1-4 GM-% IV SOLR
2.0000 g | Freq: Once | INTRAVENOUS | Status: DC
  Filled 2013-04-25: qty 100

## 2013-04-25 MED ORDER — PHENYLEPHRINE 40 MCG/ML (10ML) SYRINGE FOR IV PUSH (FOR BLOOD PRESSURE SUPPORT)
PREFILLED_SYRINGE | INTRAVENOUS | Status: AC
  Filled 2013-04-25: qty 5

## 2013-04-25 MED ORDER — ACETAMINOPHEN 325 MG PO TABS
ORAL_TABLET | ORAL | Status: AC
  Administered 2013-04-25: 650 mg via ORAL
  Filled 2013-04-25: qty 2

## 2013-04-25 MED ORDER — SODIUM BICARBONATE 8.4 % IV SOLN
INTRAVENOUS | Status: AC
  Filled 2013-04-25: qty 50

## 2013-04-25 SURGICAL SUPPLY — 38 items
APL SKNCLS STERI-STRIP NONHPOA (GAUZE/BANDAGES/DRESSINGS) ×1
BENZOIN TINCTURE PRP APPL 2/3 (GAUZE/BANDAGES/DRESSINGS) ×2 IMPLANT
CLAMP CORD UMBIL (MISCELLANEOUS) ×1 IMPLANT
CLOTH BEACON ORANGE TIMEOUT ST (SAFETY) ×2 IMPLANT
CONTAINER PREFILL 10% NBF 15ML (MISCELLANEOUS) IMPLANT
DRAIN JACKSON PRT FLT 10 (DRAIN) ×1 IMPLANT
DRAPE LG THREE QUARTER DISP (DRAPES) ×4 IMPLANT
DRSG OPSITE POSTOP 4X10 (GAUZE/BANDAGES/DRESSINGS) ×2 IMPLANT
DURAPREP 26ML APPLICATOR (WOUND CARE) ×2 IMPLANT
ELECT REM PT RETURN 9FT ADLT (ELECTROSURGICAL) ×2
ELECTRODE REM PT RTRN 9FT ADLT (ELECTROSURGICAL) ×1 IMPLANT
EVACUATOR SILICONE 100CC (DRAIN) ×1 IMPLANT
EXTRACTOR VACUUM M CUP 4 TUBE (SUCTIONS) IMPLANT
GLOVE BIO SURGEON STRL SZ 6.5 (GLOVE) ×2 IMPLANT
GLOVE BIOGEL PI IND STRL 7.0 (GLOVE) ×1 IMPLANT
GLOVE BIOGEL PI INDICATOR 7.0 (GLOVE) ×2
GOWN PREVENTION PLUS XLARGE (GOWN DISPOSABLE) ×2 IMPLANT
GOWN STRL REIN XL XLG (GOWN DISPOSABLE) ×2 IMPLANT
KIT ABG SYR 3ML LUER SLIP (SYRINGE) ×2 IMPLANT
NDL HYPO 25X5/8 SAFETYGLIDE (NEEDLE) IMPLANT
NEEDLE HYPO 25X5/8 SAFETYGLIDE (NEEDLE) ×4 IMPLANT
NS IRRIG 1000ML POUR BTL (IV SOLUTION) ×2 IMPLANT
PACK C SECTION WH (CUSTOM PROCEDURE TRAY) ×2 IMPLANT
PAD OB MATERNITY 4.3X12.25 (PERSONAL CARE ITEMS) ×2 IMPLANT
RTRCTR C-SECT PINK 25CM LRG (MISCELLANEOUS) ×1 IMPLANT
STAPLER VISISTAT 35W (STAPLE) IMPLANT
STRIP CLOSURE SKIN 1/2X4 (GAUZE/BANDAGES/DRESSINGS) ×2 IMPLANT
SUT CHROMIC 0 CT 1 (SUTURE) ×2 IMPLANT
SUT MNCRL AB 3-0 PS2 27 (SUTURE) ×1 IMPLANT
SUT PLAIN 2 0 (SUTURE)
SUT PLAIN 2 0 XLH (SUTURE) ×2 IMPLANT
SUT PLAIN ABS 2-0 CT1 27XMFL (SUTURE) ×2 IMPLANT
SUT SILK 2 0 SH (SUTURE) ×1 IMPLANT
SUT VIC AB 0 CTX 36 (SUTURE) ×8
SUT VIC AB 0 CTX36XBRD ANBCTRL (SUTURE) ×4 IMPLANT
TOWEL OR 17X24 6PK STRL BLUE (TOWEL DISPOSABLE) ×2 IMPLANT
TRAY FOLEY CATH 14FR (SET/KITS/TRAYS/PACK) ×1 IMPLANT
WATER STERILE IRR 1000ML POUR (IV SOLUTION) ×1 IMPLANT

## 2013-04-25 NOTE — Progress Notes (Signed)
Pt observed awake resting quietly, offers no c/o @ present.

## 2013-04-25 NOTE — Anesthesia Postprocedure Evaluation (Signed)
  Anesthesia Post-op Note  Patient: Pamela Erickson  Procedure(s) Performed: Procedure(s): CESAREAN SECTION (N/A)  Patient Location: PACU  Anesthesia Type:Epidural  Level of Consciousness: awake, alert  and oriented  Airway and Oxygen Therapy: Patient Spontanous Breathing  Post-op Pain: none  Post-op Assessment: Post-op Vital signs reviewed, Patient's Cardiovascular Status Stable, Respiratory Function Stable, Patent Airway, No signs of Nausea or vomiting, Pain level controlled, No headache and No backache  Post-op Vital Signs: Reviewed and stable  Complications: No apparent anesthesia complications

## 2013-04-25 NOTE — Anesthesia Preprocedure Evaluation (Addendum)
Anesthesia Evaluation  Patient identified by MRN, date of birth, ID band Patient awake    Reviewed: Allergy & Precautions, H&P , Patient's Chart, lab work & pertinent test results  Airway Mallampati: III TM Distance: >3 FB Neck ROM: full    Dental no notable dental hx.    Pulmonary neg pulmonary ROS,  breath sounds clear to auscultation  Pulmonary exam normal       Cardiovascular negative cardio ROS  Rhythm:regular Rate:Normal     Neuro/Psych negative neurological ROS  negative psych ROS   GI/Hepatic negative GI ROS, Neg liver ROS,   Endo/Other  negative endocrine ROSMorbid obesity  Renal/GU negative Renal ROS     Musculoskeletal   Abdominal   Peds  Hematology negative hematology ROS (+) anemia ,   Anesthesia Other Findings   Reproductive/Obstetrics (+) Pregnancy                           Anesthesia Physical Anesthesia Plan  ASA: III and emergent  Anesthesia Plan: Epidural   Post-op Pain Management:    Induction:   Airway Management Planned: Natural Airway  Additional Equipment:   Intra-op Plan:   Post-operative Plan:   Informed Consent: I have reviewed the patients History and Physical, chart, labs and discussed the procedure including the risks, benefits and alternatives for the proposed anesthesia with the patient or authorized representative who has indicated his/her understanding and acceptance.   Dental advisory given  Plan Discussed with: CRNA, Anesthesiologist and Surgeon  Anesthesia Plan Comments: (Patient for urgent C/Section for failure to progress and fetal intolerance to labor. Will use epidural for C/Section. M. Malen Gauze, MD)       Anesthesia Quick Evaluation

## 2013-04-25 NOTE — Progress Notes (Signed)
Dr Normand Sloop at pt bedside to discuss risks and benefits of c section for failure to progress and fetal intolerance of labor.  Pt verbalized understanding and signed consent.

## 2013-04-25 NOTE — Progress Notes (Signed)
Patient ID: Pamela Erickson, female   DOB: 1991/05/11, 22 y.o.   MRN: [id number] Pamela Erickson is a 22 y.o. G1P0 at [redacted]w[redacted]d admitted for IOL, PD  Subjective: Comfortable w epidural   Objective: BP 98/51  Pulse 118  Temp(Src) 98.2 F (36.8 C) (Axillary)  Resp 18  Ht 5\' 2"  (1.575 m)  Wt 225 lb (102.059 kg)  BMI 41.14 kg/m2  SpO2 100%  LMP 07/11/2012     FHT:  FHR: 170 bpm, variability: moderate,  accelerations:  Abscent,  decelerations:  Present variables,  UC:   regular, every 2-4 minutes SVE:   Dilation: 8 Effacement (%): 100 Station: -1 Exam by:: S.Salah Burlison  vtx not descending FSE was placed but was not tracing, 2nd FSE placed  Pt repositioned, IVF bolus given Very little return of fluid from amnioinfusion,  Lots of bloody show    Assessment / Plan: IOL, PD  Labor: protracted active phase,  Preeclampsia:  no s/s Fetal Wellbeing:  Category II Pain Control:  Epidural Anticipated MOD:  undetermined, prepared for possible cesarean section    Dr Dion Body updated, will call Dr Normand Sloop to be en route   Pamela Erickson M 04/25/2013, 6:52 PM

## 2013-04-25 NOTE — Anesthesia Procedure Notes (Signed)
Epidural Patient location during procedure: OB Start time: 04/25/2013 12:52 PM  Staffing Anesthesiologist: Angus Seller., Harrell Gave. Performed by: anesthesiologist   Preanesthetic Checklist Completed: patient identified, site marked, surgical consent, pre-op evaluation, timeout performed, IV checked, risks and benefits discussed and monitors and equipment checked  Epidural Patient position: sitting Prep: site prepped and draped and DuraPrep Patient monitoring: continuous pulse ox and blood pressure Approach: midline Injection technique: LOR air  Needle:  Needle type: Tuohy  Needle gauge: 17 G Needle length: 9 cm and 9 Needle insertion depth: 5 cm cm Catheter type: closed end flexible Catheter size: 19 Gauge Catheter at skin depth: 10 cm Test dose: negative  Assessment Events: blood not aspirated, injection not painful, no injection resistance, negative IV test and no paresthesia  Additional Notes Patient identified.  Risk benefits discussed including failed block, incomplete pain control, headache, nerve damage, paralysis, blood pressure changes, nausea, vomiting, reactions to medication both toxic or allergic, and postpartum back pain.  Patient expressed understanding and wished to proceed.  All questions were answered.  Sterile technique used throughout procedure and epidural site dressed with sterile barrier dressing. No paresthesia or other complications noted.The patient did not experience any signs of intravascular injection such as tinnitus or metallic taste in mouth nor signs of intrathecal spread such as rapid motor block. Please see nursing notes for vital signs.

## 2013-04-25 NOTE — Progress Notes (Signed)
Patient ID: Pamela Erickson, female   DOB: 09/25/1990, 22 y.o.   MRN: [id number] Pamela Erickson is a 22 y.o. G1P0 at [redacted]w[redacted]d admitted for IOL, PD  Subjective: Requests epidural, now sitting up, anesthesia at [facility]  Objective: BP 125/56  Pulse 108  Temp(Src) 98.2 F (36.8 C) (Axillary)  Resp 20  Ht 5\' 2"  (1.575 m)  Wt 225 lb (102.059 kg)  BMI 41.14 kg/m2  SpO2 100%  LMP 07/11/2012     FHT:  FHR: 140 bpm, variability: moderate,  accelerations:  Present,  decelerations:  Absent UC:   regular, every 2-3 minutes SVE:   3cm per RN   Assessment / Plan: Induction of labor due to postterm,  progressing well on pitocin  Labor: Progressing normally Preeclampsia:  no s/s Fetal Wellbeing:  Category I Pain Control:  Epidural Anticipated MOD:  NSVD  Recheck after comfortable   Update physician PRN   Keyvon Herter M 04/25/2013, 5:57 PM (late entry, pt seen at 1245)

## 2013-04-25 NOTE — Progress Notes (Signed)
Pamela Erickson, CNM informed of FHR baseline change.  EFM tracing reassuring despite change

## 2013-04-25 NOTE — Progress Notes (Signed)
Patient ID: Pamela Erickson, female   DOB: [birth year], 22 y.o.   MRN: [id number] Pt copmfotable with epidural NST category 2 with deep varaibles and good recovry Ctx q 3 minutes and inadequate.  Once the pitocin is restarted the variable continue FTP Fetal intolerance to labor Will proceed with CS R&B reviewed with the pt

## 2013-04-25 NOTE — Progress Notes (Signed)
Patient ID: Pamela Erickson, female   DOB: 1990-07-20, 22 y.o.   MRN: [id number] Pamela Erickson is a 22 y.o. G1P0 at [redacted]w[redacted]d admitted for IOL, PD  Subjective: Comfortable w epidural   Objective: BP 98/51  Pulse 118  Temp(Src) 98.2 F (36.8 C) (Axillary)  Resp 18  Ht 5\' 2"  (1.575 m)  Wt 225 lb (102.059 kg)  BMI 41.14 kg/m2  SpO2 100%  LMP 07/11/2012     FHT:  FHR: 130 bpm, variability: moderate,  accelerations:  Present,  decelerations:  Absent UC:   Not tracing well, adjusting toco, palpate mild/mod,  SVE:   6/100/-2,-3  vtx asynclitic   Assessment / Plan: Induction of labor due to postterm,  progressing well on pitocin  Labor: Progressing normally Preeclampsia:  no s/s Fetal Wellbeing:  Category I Pain Control:  Epidural Anticipated MOD:  NSVD  Continue pitocin titration AROM 1-2hrs to allow for more fetal descent  Frequent position changes  Update physician PRN   Malissa Hippo 04/25/2013, 6:59 PM (late entry, pt seen at 3pm)

## 2013-04-25 NOTE — Transfer of Care (Signed)
Immediate Anesthesia Transfer of Care Note  Patient: Pamela Erickson  Procedure(s) Performed: Procedure(s): CESAREAN SECTION (N/A)  Patient Location: PACU  Anesthesia Type:Epidural  Level of Consciousness: awake  Airway & Oxygen Therapy: Patient Spontanous Breathing  Post-op Assessment: Report given to PACU RN and Post -op Vital signs reviewed and stable  Post vital signs: stable  Complications: No apparent anesthesia complications

## 2013-04-25 NOTE — Progress Notes (Signed)
Pt sitting up eating--US monitoring maternal HR at times

## 2013-04-25 NOTE — Progress Notes (Signed)
Patient ID: Pamela Erickson, female   DOB: 04/02/1991, 22 y.o.   MRN: [id number] Pamela Erickson is a 22 y.o. G1P0 at [redacted]w[redacted]d admitted for IOL for PD Pt seen at about 10am  Subjective: Doing well, feeling some ctx, but denies need for pain meds, planning epidural   Objective: BP 117/69  Pulse 101  Temp(Src) 98.2 F (36.8 C) (Oral)  Resp 18  Ht 5\' 2"  (1.575 m)  Wt 225 lb (102.059 kg)  BMI 41.14 kg/m2  LMP 07/11/2012     FHT:  FHR: 140 bpm, variability: moderate,  accelerations:  Present,  decelerations:  Absent UC:   irregular,  SVE:   Dilation: 1.5 Effacement (%): 50 Station: -3 Exam by:: J.Thornton, RN  Pitocin started  Assessment / Plan: IOL, rcv'd cytotec overnight, now on pitocin  Labor: latent Preeclampsia:  no s/s Fetal Wellbeing:  Category I Pain Control:  n/a, plans epidural Anticipated MOD:  NSVD  Titrate pitocin per protocol, AROM when appropriate Recheck when requests epidural or prn  Update physician PRN   Jerre Vandrunen M 04/25/2013, 12:38 PM

## 2013-04-25 NOTE — Progress Notes (Signed)
Pamela Erickson is a 22 y.Erickson. G1P0 at 104w1d admitted for induction of labor due to Post dates. Due date 04/17/13.  Subjective: Pt sleeping at intervals.  2nd dose of cytotec placed at 0221.    Objective: BP 121/57  Pulse 97  Temp(Src) 98.1 F (36.7 C) (Oral)  Resp 20  Ht 5\' 2"  (1.575 m)  Wt 102.059 kg (225 lb)  BMI 41.14 kg/m2  LMP 07/11/2012  FHT:  FHR: 135 bpm, variability: moderate,  accelerations:  Present,  decelerations:  Absent UC:   irregular, every 7.5-8 minutes SVE:   Dilation: 1.5 Effacement (%): 50 Station: -3 Exam by:: Pamela Erickson, RNC  Labs: Lab Results  Component Value Date   WBC 6.1 04/24/2013   HGB 11.6* 04/24/2013   HCT 33.7* 04/24/2013   MCV 75.2* 04/24/2013   PLT 207 04/24/2013    Assessment / Plan: Induction of labor due to postterm at [redacted]w[redacted]d  Labor: No labor at present. Preeclampsia:  no signs or symptoms of toxicity Fetal Wellbeing:  Category I Pain Control:  Plans epidural I/D:  GBS neg/Afebrile Anticipated MOD:  NSVD  Continue current care and begin Pitocin in AM after light laboring breakfast and shower if pt desires.  Pamela Erickson. 04/25/2013, 3:13 AM

## 2013-04-25 NOTE — Op Note (Signed)
Cesarean Section Procedure Note   Pamela Erickson  04/24/2013 - 04/25/2013  Indications: Dystocia and fetal intolerance to labor   Pre-operative Diagnosis: Fetal intolerance to labor, arrest of dilatation.   Post-operative Diagnosis: Same   Surgeon: Surgeon(s) and Role:    * Michael Litter, MD - Primary   Assistants: Gevena Barre  Anesthesia: epidural   Procedure Primary cesarean with two layer closure  Procedure Details:  The patient was seen in the Holding Room. The risks, benefits, complications, treatment options, and expected outcomes were discussed with the patient. The patient concurred with the proposed plan, giving informed consent. identified as [name] and the procedure verified as C-Section Delivery. A Time Out was held and the above information confirmed.  After induction of anesthesia, the patient was draped and prepped in the usual sterile manner. A transverse incision was made and carried down through the subcutaneous tissue to the fascia. Fascial incision was made in the midline and extended transversely. The fascia was separated from the underlying rectus muscle superiorly and inferiorly. The peritoneum was identified and entered. Peritoneal incision was extended longitudinally with good visualization of bowel and bladder. The utero-vesical peritoneal reflection was incised transversely and the bladder flap was bluntly freed from the lower uterine segment.  An alexsis retractor was placed in the abdomen.   A low transverse uterine incision was made. Delivered from cephalic presentation was a  infant, with Apgar scores of 9 at one minute and 9 at five minutes. Cord ph was sent the umbilical cord was clamped and cut cord blood was obtained for evaluation. The placenta was removed Intact and appeared normal. The uterine outline, tubes and ovaries appeared normal}. The uterine incision was closed with running locked sutures of 0Vicryl. A second layer 0 vicrlyl was used to  imbricate the uterine incision    Hemostasis was observed. Lavage was carried out until clear. The alexsis was removed.  The peritoneum was closed with 0 chromic.  The muscles were examined and any bleeders were made hemostatic using bovie cautery device.   The fascia was then reapproximated with running sutures of 0 vicryl.  Size 10 JP drain placed in the SQ tissue.   The subcutaneous tissue was reapproximated  With interrupted stitches using 2-0 plain gut. The subcuticular closure was performed using 3-66monocryl     Instrument, sponge, and needle counts were correct prior the abdominal closure and were correct at the conclusion of the case.    Findings: infant was delivered from vtx presentation. The fluid was sig for thick meconium.  The uterus tubes and ovaries appeared normal.     Estimated Blood Loss:  Total IV Fluids:   Urine Output: 250CC OF pink colored urine then cleared near the end of the case  Specimens:placenta to pathology  Complications: no complications  Disposition: PACU - hemodynamically stable.   Maternal Condition: stable   Baby condition / location:  nursery-stable  Attending Attestation: I was present and scrubbed for the entire procedure.   Signed: Surgeon(s): Michael Litter, MD

## 2013-04-26 ENCOUNTER — Encounter (HOSPITAL_COMMUNITY): Payer: Self-pay | Admitting: Obstetrics and Gynecology

## 2013-04-26 LAB — CBC
HCT: 21.9 % — ABNORMAL LOW (ref 36.0–46.0)
Hemoglobin: 7.7 g/dL — ABNORMAL LOW (ref 12.0–15.0)
MCH: 26.6 pg (ref 26.0–34.0)
MCHC: 35.2 g/dL (ref 30.0–36.0)
MCV: 75.5 fL — ABNORMAL LOW (ref 78.0–100.0)
RBC: 2.9 MIL/uL — ABNORMAL LOW (ref 3.87–5.11)
RDW: 14.6 % (ref 11.5–15.5)
WBC: 15 10*3/uL — ABNORMAL HIGH (ref 4.0–10.5)

## 2013-04-26 MED ORDER — BISACODYL 10 MG RE SUPP: 10.0000 mg | Freq: Every day | RECTAL | Status: DC | PRN

## 2013-04-26 MED ORDER — NALBUPHINE SYRINGE 5 MG/0.5 ML
5.0000 mg | INJECTION | INTRAMUSCULAR | Status: DC | PRN
  Administered 2013-04-26: 10 mg via SUBCUTANEOUS
  Filled 2013-04-26: qty 1

## 2013-04-26 MED ORDER — DIPHENHYDRAMINE HCL 25 MG PO CAPS
25.0000 mg | ORAL_CAPSULE | ORAL | Status: DC | PRN
  Administered 2013-04-26: 25 mg via ORAL
  Filled 2013-04-26: qty 1

## 2013-04-26 MED ORDER — DIBUCAINE 1 % RE OINT: 1.0000 "application " | TOPICAL_OINTMENT | RECTAL | Status: DC | PRN

## 2013-04-26 MED ORDER — MEASLES, MUMPS & RUBELLA VAC ~~LOC~~ INJ
0.5000 mL | INJECTION | Freq: Once | SUBCUTANEOUS | Status: DC
  Filled 2013-04-26: qty 0.5

## 2013-04-26 MED ORDER — ONDANSETRON HCL 4 MG PO TABS: 4.0000 mg | ORAL_TABLET | ORAL | Status: DC | PRN

## 2013-04-26 MED ORDER — SIMETHICONE 80 MG PO CHEW
80.0000 mg | CHEWABLE_TABLET | Freq: Three times a day (TID) | ORAL | Status: DC
  Administered 2013-04-26 – 2013-04-28 (×9): 80 mg via ORAL
  Filled 2013-04-26 (×7): qty 1

## 2013-04-26 MED ORDER — VALACYCLOVIR HCL 500 MG PO TABS
500.0000 mg | ORAL_TABLET | Freq: Every day | ORAL | Status: DC
  Administered 2013-04-26 – 2013-04-28 (×3): 500 mg via ORAL
  Filled 2013-04-26 (×4): qty 1

## 2013-04-26 MED ORDER — NALBUPHINE SYRINGE 5 MG/0.5 ML
5.0000 mg | INJECTION | INTRAMUSCULAR | Status: DC | PRN
  Filled 2013-04-26 (×2): qty 1

## 2013-04-26 MED ORDER — METOCLOPRAMIDE HCL 5 MG/ML IJ SOLN: 10.0000 mg | Freq: Three times a day (TID) | INTRAMUSCULAR | Status: DC | PRN

## 2013-04-26 MED ORDER — DEXTROSE 5 % IV SOLN
1.0000 ug/kg/h | INTRAVENOUS | Status: DC | PRN
  Filled 2013-04-26: qty 2

## 2013-04-26 MED ORDER — SIMETHICONE 80 MG PO CHEW: 80.0000 mg | CHEWABLE_TABLET | ORAL | Status: DC

## 2013-04-26 MED ORDER — ONDANSETRON HCL 4 MG/2ML IJ SOLN: 4.0000 mg | Freq: Three times a day (TID) | INTRAMUSCULAR | Status: DC | PRN

## 2013-04-26 MED ORDER — LANOLIN HYDROUS EX OINT: 1.0000 "application " | TOPICAL_OINTMENT | CUTANEOUS | Status: DC | PRN

## 2013-04-26 MED ORDER — OXYCODONE-ACETAMINOPHEN 5-325 MG PO TABS
1.0000 | ORAL_TABLET | ORAL | Status: DC | PRN
  Administered 2013-04-26 – 2013-04-27 (×3): 1 via ORAL
  Administered 2013-04-27: 2 via ORAL
  Administered 2013-04-27 – 2013-04-28 (×5): 1 via ORAL
  Administered 2013-04-28: 2 via ORAL
  Filled 2013-04-26 (×3): qty 1
  Filled 2013-04-26: qty 2
  Filled 2013-04-26 (×5): qty 1
  Filled 2013-04-26: qty 2
  Filled 2013-04-26: qty 1

## 2013-04-26 MED ORDER — DIPHENHYDRAMINE HCL 25 MG PO CAPS: 25.0000 mg | ORAL_CAPSULE | Freq: Four times a day (QID) | ORAL | Status: DC | PRN

## 2013-04-26 MED ORDER — NALOXONE HCL 0.4 MG/ML IJ SOLN: 0.4000 mg | INTRAMUSCULAR | Status: DC | PRN

## 2013-04-26 MED ORDER — DIPHENHYDRAMINE HCL 50 MG/ML IJ SOLN
12.5000 mg | INTRAMUSCULAR | Status: DC | PRN
Start: 2013-04-26 — End: 2013-04-28

## 2013-04-26 MED ORDER — SIMETHICONE 80 MG PO CHEW
80.0000 mg | CHEWABLE_TABLET | ORAL | Status: DC
  Administered 2013-04-28: 80 mg via ORAL
  Filled 2013-04-26 (×2): qty 1

## 2013-04-26 MED ORDER — SODIUM CHLORIDE 0.9 % IJ SOLN: 3.0000 mL | INTRAMUSCULAR | Status: DC | PRN

## 2013-04-26 MED ORDER — DIPHENHYDRAMINE HCL 50 MG/ML IJ SOLN
25.0000 mg | INTRAMUSCULAR | Status: DC | PRN
Start: 2013-04-26 — End: 2013-04-28

## 2013-04-26 MED ORDER — SENNOSIDES-DOCUSATE SODIUM 8.6-50 MG PO TABS: 2.0000 | ORAL_TABLET | ORAL | Status: DC

## 2013-04-26 MED ORDER — WITCH HAZEL-GLYCERIN EX PADS: 1.0000 "application " | MEDICATED_PAD | CUTANEOUS | Status: DC | PRN

## 2013-04-26 MED ORDER — PRENATAL MULTIVITAMIN CH
1.0000 | ORAL_TABLET | Freq: Every day | ORAL | Status: DC
  Administered 2013-04-26 – 2013-04-28 (×3): 1 via ORAL
  Filled 2013-04-26 (×3): qty 1

## 2013-04-26 MED ORDER — MENTHOL 3 MG MT LOZG: 1.0000 | LOZENGE | OROMUCOSAL | Status: DC | PRN

## 2013-04-26 MED ORDER — ONDANSETRON HCL 4 MG/2ML IJ SOLN: 4.0000 mg | INTRAMUSCULAR | Status: DC | PRN

## 2013-04-26 MED ORDER — IBUPROFEN 600 MG PO TABS
600.0000 mg | ORAL_TABLET | Freq: Four times a day (QID) | ORAL | Status: DC
  Administered 2013-04-26 – 2013-04-28 (×9): 600 mg via ORAL
  Filled 2013-04-26 (×9): qty 1

## 2013-04-26 MED ORDER — SIMETHICONE 80 MG PO CHEW: 80.0000 mg | CHEWABLE_TABLET | ORAL | Status: DC | PRN

## 2013-04-26 MED ORDER — SENNOSIDES-DOCUSATE SODIUM 8.6-50 MG PO TABS
2.0000 | ORAL_TABLET | ORAL | Status: DC
  Administered 2013-04-26 – 2013-04-28 (×2): 2 via ORAL
  Filled 2013-04-26 (×2): qty 2

## 2013-04-26 MED ORDER — TETANUS-DIPHTH-ACELL PERTUSSIS 5-2.5-18.5 LF-MCG/0.5 IM SUSP
0.5000 mL | Freq: Once | INTRAMUSCULAR | Status: AC
  Administered 2013-04-26: 0.5 mL via INTRAMUSCULAR
  Filled 2013-04-26: qty 0.5

## 2013-04-26 MED ORDER — FERROUS SULFATE 325 (65 FE) MG PO TABS
325.0000 mg | ORAL_TABLET | Freq: Two times a day (BID) | ORAL | Status: DC
  Administered 2013-04-26 – 2013-04-28 (×4): 325 mg via ORAL
  Filled 2013-04-26 (×4): qty 1

## 2013-04-26 NOTE — Anesthesia Postprocedure Evaluation (Signed)
Anesthesia Post Note  Patient: Pamela Erickson  Procedure(s) Performed: Procedure(s) (LRB): CESAREAN SECTION (N/A)  Anesthesia type: Epidural  Patient location: Mother/Baby  Post pain: Pain level controlled  Post assessment: Post-op Vital signs reviewed  Last Vitals:  Filed Vitals:   04/26/13 0734  BP: 98/58  Pulse: 89  Temp: 37.1 C  Resp: 20    Post vital signs: Reviewed  Level of consciousness: awake  Complications: No apparent anesthesia complications

## 2013-04-26 NOTE — Progress Notes (Addendum)
Subjective: Postpartum Day 1: Cesarean Delivery Patient reports incisional pain, tolerating PO and no problems voiding.  She is breast feeding.  Denies light headedness or dizziness with ambulation.  Objective: Vital signs in last 24 hours: Temp:  [97.8 F (36.6 C)-102.9 F (39.4 C)] 98.5 F (36.9 C) (10/20 1147) Pulse Rate:  [66-166] 68 (10/20 1148) Resp:  [18-22] 19 (10/20 1147) BP: (73-144)/(23-90) 108/52 mmHg (10/20 1148) SpO2:  [95 %-100 %] 99 % (10/20 1147) JP drain 10cc/11hrs  Physical Exam:  General: alert Lochia: appropriate Uterine Fundus: firm Incision: healing well, small amount of staining on right side of dressing which is intact DVT Evaluation: No evidence of DVT seen on physical exam.   Recent Labs  04/24/13 2030 04/26/13 0605  HGB 11.6* 7.7*  HCT 33.7* 21.9*    Assessment/Plan: Status post Cesarean section. Doing well postoperatively.  Continue current care Check orthostatics  Purcell Nails 04/26/2013, 11:51 AM

## 2013-04-27 MED ORDER — AMOXICILLIN-POT CLAVULANATE 875-125 MG PO TABS
1.0000 | ORAL_TABLET | Freq: Two times a day (BID) | ORAL | Status: DC
  Administered 2013-04-27 – 2013-04-28 (×3): 1 via ORAL
  Filled 2013-04-27 (×5): qty 1

## 2013-04-27 NOTE — Progress Notes (Signed)
Post Partum Day 2  Subjective: no complaints, up ad lib, voiding, tolerating PO and + flatus. The patient is able to ambulate without difficulty and dizziness.  Objective: Blood pressure 115/68, pulse 102, temperature 98.2 F (36.8 C), temperature source Oral, resp. rate 20, height 5\' 2"  (1.575 m), weight 225 lb (102.059 kg), last menstrual period 07/11/2012, SpO2 99.00%, unknown if currently breastfeeding.  Physical Exam:  General: alert Lochia: appropriate Uterine Fundus: firm Incision: Clean and dry DVT Evaluation: No evidence of DVT seen on physical exam.   Recent Labs  04/24/13 2030 04/26/13 0605  HGB 11.6* 7.7*  HCT 33.7* 21.9*    Assessment/Plan:  Plan for discharge tomorrow and Breastfeeding Discontinue IV Unasyn and begin Augmentin. Transfusion discussed. Risk and benefits reviewed. Declined for now. Contraceptive options reviewed. The patient will decide before her 6 weeks visit.   LOS: 3 days   Anoushka Divito V 04/27/2013, 10:01 AM

## 2013-04-27 NOTE — Lactation Note (Signed)
This note was copied from the chart of Pamela Erickson. Lactation Consultation Note  Arrived to room with mom at bedside pumping right breast, she pumped about 5ml previously on left breast.  Baby cueing and diaper changed.  Mom has erect nipple and colostrum visible.  Minimal assistance needed to latch baby skin to skin in football hold on right breast.  Baby with wide open mouth rhythmic sucking and swallows heard. Mom encouraged to keep nose against breast during feedings and to massage breast to keep baby awake if needed. Baby continues to nurse well after 15 minutes.  Mom will plan to use DEBP to pre pump for breast feedings and use pumped milk as appetizer or desert as needed.  Mom encouraged not to use nipple shield if baby latches well with out.  Mom denies any pain currently. Encouraged to feed with cues and allow cluster feedings.  Mom is happy baby is latching well at this time and is eager to continue breast feeding.  Patient Name: Pamela Erickson [id number] Date: 04/27/2013 Reason for consult: Follow-up assessment;Difficult latch   Maternal Data Has patient been taught Hand Expression?: Yes Does the patient have breastfeeding experience prior to this delivery?: No  Feeding Feeding Type: Breast Fed Length of feed:  (15 minutes plus)  LATCH Score/Interventions Latch: Grasps breast easily, tongue down, lips flanged, rhythmical sucking. Intervention(s): Assist with latch;Breast massage;Breast compression  Audible Swallowing: A few with stimulation  Type of Nipple: Flat Intervention(s): Double electric pump  Comfort (Breast/Nipple): Soft / non-tender     Hold (Positioning): No assistance needed to correctly position infant at breast. Intervention(s): Skin to skin;Support Pillows;Breastfeeding basics reviewed  LATCH Score: 8  Lactation Tools Discussed/Used Breast pump type: Double-Electric Breast Pump   Consult Status Consult Status: Follow-up Date:  04/28/13 Follow-up type: In-patient    Jannifer Rodney 04/27/2013, 5:34 PM

## 2013-04-28 LAB — CBC
Hemoglobin: 7.3 g/dL — ABNORMAL LOW (ref 12.0–15.0)
MCHC: 34.6 g/dL (ref 30.0–36.0)
RBC: 2.82 MIL/uL — ABNORMAL LOW (ref 3.87–5.11)
RDW: 14.7 % (ref 11.5–15.5)

## 2013-04-28 MED ORDER — MENTHOL 3 MG MT LOZG: 1.0000 | LOZENGE | OROMUCOSAL | Status: DC | PRN

## 2013-04-28 MED ORDER — OXYTOCIN 40 UNITS IN LACTATED RINGERS INFUSION - SIMPLE MED: 62.5000 mL/h | INTRAVENOUS | Status: DC

## 2013-04-28 MED ORDER — AMOXICILLIN-POT CLAVULANATE 875-125 MG PO TABS: 1.0000 | ORAL_TABLET | Freq: Two times a day (BID) | ORAL | Status: AC

## 2013-04-28 MED ORDER — SIMETHICONE 80 MG PO CHEW: 80.0000 mg | CHEWABLE_TABLET | ORAL | Status: DC | PRN

## 2013-04-28 MED ORDER — ONDANSETRON HCL 4 MG PO TABS: 4.0000 mg | ORAL_TABLET | ORAL | Status: DC | PRN

## 2013-04-28 MED ORDER — SIMETHICONE 80 MG PO CHEW: 80.0000 mg | CHEWABLE_TABLET | Freq: Three times a day (TID) | ORAL | Status: DC

## 2013-04-28 MED ORDER — ONDANSETRON HCL 4 MG/2ML IJ SOLN: 4.0000 mg | INTRAMUSCULAR | Status: DC | PRN

## 2013-04-28 MED ORDER — PRENATAL MULTIVITAMIN CH: 1.0000 | ORAL_TABLET | Freq: Every day | ORAL | Status: DC

## 2013-04-28 MED ORDER — LACTATED RINGERS IV SOLN: INTRAVENOUS | Status: DC

## 2013-04-28 MED ORDER — LANOLIN HYDROUS EX OINT: 1.0000 "application " | TOPICAL_OINTMENT | CUTANEOUS | Status: DC | PRN

## 2013-04-28 MED ORDER — TETANUS-DIPHTH-ACELL PERTUSSIS 5-2.5-18.5 LF-MCG/0.5 IM SUSP: 0.5000 mL | Freq: Once | INTRAMUSCULAR | Status: DC

## 2013-04-28 MED ORDER — SENNOSIDES-DOCUSATE SODIUM 8.6-50 MG PO TABS: 2.0000 | ORAL_TABLET | ORAL | Status: DC

## 2013-04-28 MED ORDER — DIBUCAINE 1 % RE OINT: 1.0000 "application " | TOPICAL_OINTMENT | RECTAL | Status: DC | PRN

## 2013-04-28 MED ORDER — IBUPROFEN 600 MG PO TABS: 600.0000 mg | ORAL_TABLET | Freq: Four times a day (QID) | ORAL | Status: DC

## 2013-04-28 MED ORDER — OXYCODONE-ACETAMINOPHEN 5-325 MG PO TABS: 1.0000 | ORAL_TABLET | ORAL | Status: DC | PRN

## 2013-04-28 MED ORDER — SIMETHICONE 80 MG PO CHEW: 80.0000 mg | CHEWABLE_TABLET | ORAL | Status: DC

## 2013-04-28 MED ORDER — WITCH HAZEL-GLYCERIN EX PADS: 1.0000 "application " | MEDICATED_PAD | CUTANEOUS | Status: DC | PRN

## 2013-04-28 MED ORDER — ZOLPIDEM TARTRATE 5 MG PO TABS: 5.0000 mg | ORAL_TABLET | Freq: Every evening | ORAL | Status: DC | PRN

## 2013-04-28 MED ORDER — DIPHENHYDRAMINE HCL 25 MG PO CAPS: 25.0000 mg | ORAL_CAPSULE | Freq: Four times a day (QID) | ORAL | Status: DC | PRN

## 2013-04-28 NOTE — Discharge Summary (Signed)
Obstetric Discharge Summary Reason for Admission: induction of labor Prenatal Procedures: NST and ultrasound Intrapartum Procedures: cesarean: low cervical, transverse Postpartum Procedures: antibiotics Complications-Operative and Postpartum: none Hemoglobin  Date Value Range Status  04/28/2013 7.3* 12.0 - 15.0 g/dL Final     HCT  Date Value Range Status  04/28/2013 21.1* 36.0 - 46.0 % Final    Physical Exam:  General: alert and cooperative Lochia: appropriate Uterine Fundus: firm Incision: healing well, no significant drainage, JP drain removed DVT Evaluation: Negative Homan's sign. Physical Examination: Chest - clear to auscultation, no wheezes, rales or rhonchi, symmetric air entry Heart - normal rate and regular rhythm Abdomen - soft, nontender, nondistended, no masses or organomegaly   Hospital Course:   The patient came in labor and had a CS for 16 WKS, INDUCTION by Dr Normand Sloop MD for chorio, NRFHTS and FTP.  Post operatively she did well.  She tolerated a regular diet and her exam is WNL and documented in the chart. .  She has recovered well and is ready for discharge.  She is beast feeding and will use nothing for [facility] currently   Discharge Diagnoses: Term Pregnancy-delivered and Amnionitis  Discharge Information: Date: 04/28/2013 Activity: pelvic rest Diet: routine Medications: PNV, Ibuprofen, Percocet and Augmentin Condition: stable Instructions: refer to practice specific booklet Discharge to: home Follow-up Information   Follow up with [facility]. Schedule an appointment as soon as possible for a visit in 6 weeks.   Specialty:  Obstetrics and Gynecology   Contact information:   [address] [place] [address] [phone]      Newborn Data: Live born female  Birth Weight: 7 lb 9.2 oz (3436 g) APGAR: 9, 9  Home with mother.  Pamela Erickson A 04/28/2013, 11:19 AM

## 2013-06-08 DIAGNOSIS — D649 Anemia, unspecified: Secondary | ICD-10-CM

## 2013-06-08 HISTORY — DX: Anemia, unspecified: D64.9

## 2014-05-09 ENCOUNTER — Encounter (HOSPITAL_COMMUNITY): Payer: Self-pay | Admitting: Obstetrics and Gynecology

## 2015-08-30 ENCOUNTER — Emergency Department (INDEPENDENT_AMBULATORY_CARE_PROVIDER_SITE_OTHER)
Admission: EM | Admit: 2015-08-30 | Discharge: 2015-08-30 | Disposition: A | Discharge status: Home / Self Care | Payer: BC Managed Care – PPO | Source: Home / Self Care | Attending: Family Medicine | Admitting: Family Medicine

## 2015-08-30 ENCOUNTER — Encounter (HOSPITAL_COMMUNITY): Payer: Self-pay | Admitting: *Deleted

## 2015-08-30 DIAGNOSIS — J069 Acute upper respiratory infection, unspecified: Secondary | ICD-10-CM

## 2015-08-30 MED ORDER — GUAIFENESIN-CODEINE 100-10 MG/5ML PO SYRP: 10.0000 mL | ORAL_SOLUTION | Freq: Four times a day (QID) | ORAL | Status: DC | PRN

## 2015-08-30 MED ORDER — IPRATROPIUM BROMIDE 0.06 % NA SOLN: 2.0000 | Freq: Four times a day (QID) | NASAL | Status: DC

## 2015-08-30 NOTE — Discharge Instructions (Signed)
Drink plenty of fluids as discussed, use medicine as prescribed, and mucinex or delsym for cough. Return or see your doctor if further problems °

## 2015-08-30 NOTE — ED Notes (Signed)
Pt  Reports  Symptoms  Of  Cough  /  Congestion   With  sorethroat  X  2  Days         Pt daughter is  Ill  With  Similar  Symptoms  -  Pt  Is  Sitting  Upright  On  Exam table  In no  Acute  Distress

## 2015-08-30 NOTE — ED Provider Notes (Signed)
CSN: [id number]     Arrival date & time 08/30/15  1909 History   First MD Initiated Contact with Patient 08/30/15 2033     Chief Complaint  Patient presents with  . URI   (Consider location/radiation/quality/duration/timing/severity/associated sxs/prior Treatment) Patient is a 25 y.o. female presenting with URI. The history is provided by the patient.  URI Presenting symptoms: congestion, cough, fever and rhinorrhea   Severity:  Mild Onset quality:  Gradual Duration:  4 days Progression:  Unchanged Chronicity:  New Relieved by:  None tried Worsened by:  Nothing tried Associated symptoms: no myalgias, no sinus pain and no wheezing   Risk factors: sick contacts     Past Medical History  Diagnosis Date  . Abnormal Pap smear     last pap 01/2012  . 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 Torn ACL (anterior cruciate ligament) 2008  . Herpes   . Anemia     During pregnancy   Past Surgical History  Procedure Laterality Date  . Anterior cruciate ligament repair  2008  . Cesarean section N/A 04/25/2013    Procedure: CESAREAN SECTION;  Surgeon: Michael Litter, MD;  Location: [facility];  Service: Obstetrics;  Laterality: N/A;   Family History  Problem Relation Age of Onset  . Hypertension Mother   . Hypertension Father   . Diabetes Maternal Aunt    Social History  Substance Use Topics  . Smoking status: Never Smoker   . Smokeless tobacco: Never Used  . Alcohol Use: Yes     Comment: OCC   OB History    Gravida Para Term Preterm AB TAB SAB Ectopic Multiple Living   Review of Systems  Constitutional: Positive for fever. Negative for activity change and appetite change.  HENT: Positive for congestion, postnasal drip and rhinorrhea.   Respiratory: Positive for cough. Negative for shortness of breath and wheezing.   Cardiovascular: Negative.   Genitourinary: Negative.   Musculoskeletal: Negative for myalgias.  All other systems reviewed and are  negative.   Allergies  Review of patient's allergies indicates no known allergies.  Home Medications   Prior to Admission medications   Medication Sig Start Date End Date Taking? Authorizing Provider  ferrous sulfate 325 (65 FE) MG tablet Take 325 mg by mouth daily with breakfast.    Historical Provider, MD  ibuprofen (ADVIL,MOTRIN) 600 MG tablet Take 1 tablet (600 mg total) by mouth every 6 (six) hours. 04/28/13   Jaymes Graff, MD  oxyCODONE-acetaminophen (PERCOCET/ROXICET) 5-325 MG per tablet Take 1-2 tablets by mouth every 4 (four) hours as needed. 04/28/13   Jaymes Graff, MD  Prenatal Vit-Fe Fumarate-FA (PRENATAL MULTIVITAMIN) TABS tablet Take 1 tablet by mouth every morning.    Historical Provider, MD  valACYclovir (VALTREX) 500 MG tablet Take 500 mg by mouth daily.    Historical Provider, MD   Meds Ordered and Administered this Visit  Medications - No data to display  BP 124/80 mmHg  Pulse 93  Temp(Src) 99.7 F (37.6 C) (Tympanic)  Resp 16  SpO2 100%  LMP 08/23/2015 No data found.   Physical Exam  Constitutional: She is oriented to person, place, and time. She appears well-developed and well-nourished. No distress.  HENT:  Right Ear: External ear normal.  Left Ear: External ear normal.  Mouth/Throat: Oropharynx is clear and moist.  Neck: Normal range of motion. Neck supple.  Cardiovascular: Normal rate,  regular rhythm, normal heart sounds and intact distal pulses.   Pulmonary/Chest: Effort normal and breath sounds normal. She has no wheezes. She has no rales.  Lymphadenopathy:    She has no cervical adenopathy.  Neurological: She is alert and oriented to person, place, and time.  Skin: Skin is warm and dry.  Nursing note and vitals reviewed.   ED Course  Procedures (including critical care time)  Labs Review Labs Reviewed - No data to display  Imaging Review No results found.   Visual Acuity Review  Right Eye Distance:   Left Eye Distance:    Bilateral Distance:    Right Eye Near:   Left Eye Near:    Bilateral Near:         MDM  No diagnosis found. Meds ordered this encounter  Medications  . ipratropium (ATROVENT) 0.06 % nasal spray    Sig: Place 2 sprays into both nostrils 4 (four) times daily.    Dispense:  15 mL    Refill:  1  . guaiFENesin-codeine (ROBITUSSIN AC) 100-10 MG/5ML syrup    Sig: Take 10 mLs by mouth 4 (four) times daily as needed for cough.    Dispense:  180 mL    Refill:  0        Linna Hoff, MD 08/30/15 2047

## 2016-07-08 HISTORY — PX: OTHER SURGICAL HISTORY: SHX169

## 2016-11-26 ENCOUNTER — Other Ambulatory Visit: Payer: Self-pay | Admitting: Obstetrics and Gynecology

## 2017-04-28 ENCOUNTER — Ambulatory Visit (HOSPITAL_COMMUNITY)
Admission: EM | Admit: 2017-04-28 | Discharge: 2017-04-28 | Disposition: A | Discharge status: Home / Self Care | Payer: BC Managed Care – PPO | Attending: Internal Medicine | Admitting: Internal Medicine

## 2017-04-28 ENCOUNTER — Encounter (HOSPITAL_COMMUNITY): Payer: Self-pay | Admitting: Emergency Medicine

## 2017-04-28 DIAGNOSIS — Z3201 Encounter for pregnancy test, result positive: Secondary | ICD-10-CM | POA: Diagnosis not present

## 2017-04-28 DIAGNOSIS — Z3A08 8 weeks gestation of pregnancy: Secondary | ICD-10-CM

## 2017-04-28 DIAGNOSIS — R21 Rash and other nonspecific skin eruption: Secondary | ICD-10-CM

## 2017-04-28 LAB — POCT PREGNANCY, URINE: Preg Test, Ur: POSITIVE — AB

## 2017-04-28 MED ORDER — TRIAMCINOLONE ACETONIDE 0.1 % EX CREA: 1.0000 "application " | TOPICAL_CREAM | Freq: Two times a day (BID) | CUTANEOUS | 0 refills | Status: DC

## 2017-04-28 NOTE — ED Triage Notes (Signed)
Pt c/o rash on her chest and back x1 month, pt also requesting pregnancy test done for medicaid.

## 2017-04-28 NOTE — ED Provider Notes (Signed)
[facility]    CSN: [id number] Arrival date & time: 04/28/17  1804     History   Chief Complaint Chief Complaint  Patient presents with  . Rash    HPI Pamela Erickson is a 25 y.o. female.   Pamela Erickson presents with complaints of rash to her chest and back which has been present since September and has seemed to increase. Without previous similar rash. It is mildly itching. It isn't painful. No new products or exposures to allergens. Has tried applying thick lotion to the area which has not helped. She also states she would like a confirmatory pregnancy test. LMP 03/02/17. She is taking a prenatal vitamin. Doesn't smoke. She is awaiting medicaid pregnancy insurance to see Obgyn. Without abdominal pain or vaginal bleeding.       Past Medical History:  Diagnosis Date  . Abnormal Pap smear    last pap 01/2012  . Anemia    During pregnancy  . Herpes   . 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 Torn ACL (anterior cruciate ligament) 2008    Patient Active Problem List   Diagnosis Date Noted  . 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 Vaginal bleeding before [redacted] weeks gestation 10/07/2012    Past Surgical History:  Procedure Laterality Date  . ANTERIOR CRUCIATE LIGAMENT REPAIR  2008  . CESAREAN SECTION N/A 04/25/2013   Procedure: CESAREAN SECTION;  Surgeon: Michael Litter, MD;  Location: [facility];  Service: Obstetrics;  Laterality: N/A;    OB History    Gravida Para Term Preterm AB Living   1 1 1     1    SAB TAB Ectopic Multiple Live Births           1       Home Medications    Prior to Admission medications   Medication Sig Start Date End Date Taking? Authorizing Provider  ferrous sulfate 325 (65 FE) MG tablet Take 325 mg by mouth daily with breakfast.    [provider]  guaiFENesin-codeine (ROBITUSSIN AC) 100-10 MG/5ML syrup Take 10 mLs by mouth 4 (four) times daily as needed for cough. 08/30/15   Linna Hoff, MD  ibuprofen (ADVIL,MOTRIN) 600 MG tablet Take  1 tablet (600 mg total) by mouth every 6 (six) hours. 04/28/13   Dillard, Samule Ohm, MD  ipratropium (ATROVENT) 0.06 % nasal spray Place 2 sprays into both nostrils 4 (four) times daily. 08/30/15   Linna Hoff, MD  oxyCODONE-acetaminophen (PERCOCET/ROXICET) 5-325 MG per tablet Take 1-2 tablets by mouth every 4 (four) hours as needed. 04/28/13   Jaymes Graff, MD  Prenatal Vit-Fe Fumarate-FA (PRENATAL MULTIVITAMIN) TABS tablet Take 1 tablet by mouth every morning.    [provider]  triamcinolone cream (KENALOG) 0.1 % Apply 1 application topically 2 (two) times daily. 04/28/17   Georgetta Haber, NP  valACYclovir (VALTREX) 500 MG tablet Take 500 mg by mouth daily.    [provider]    Family History Family History  Problem Relation Age of Onset  . Hypertension Mother   . Hypertension Father   . Diabetes Maternal Aunt     Social History Social History  Substance Use Topics  . Smoking status: Never Smoker  . Smokeless tobacco: Never Used  . Alcohol use Yes     Comment: OCC     Allergies   Ibuprofen   Review of Systems Review of Systems   Physical Exam Triage Vital Signs ED Triage Vitals [04/28/17  1841]  Enc Vitals Group     BP 135/82     Pulse Rate 80     Resp 18     Temp 98.2 F (36.8 C)     Temp Source Oral     SpO2 100 %     Weight      Height      Head Circumference      Peak Flow      Pain Score      Pain Loc      Pain Edu?      Excl. in GC?    No data found.   Updated Vital Signs BP 135/82   Pulse 80   Temp 98.2 F (36.8 C) (Oral)   Resp 18   LMP 03/02/2017   SpO2 100%   Visual Acuity Right Eye Distance:   Left Eye Distance:   Bilateral Distance:    Right Eye Near:   Left Eye Near:    Bilateral Near:     Physical Exam  Constitutional: She is oriented to person, place, and time. She appears well-developed and well-nourished. No distress.  Cardiovascular: Normal rate and regular rhythm.   Pulmonary/Chest: Effort normal  and breath sounds normal.  Abdominal: Soft. There is no tenderness.  Neurological: She is alert and oriented to person, place, and time.  Skin: Skin is warm and dry. Rash noted.  Dry scaling scattered patches, skin toned to chest, breasts, and low back     UC Treatments / Results  Labs (all labs ordered are listed, but only abnormal results are displayed) Labs Reviewed  POCT PREGNANCY, URINE - Abnormal; Notable for the following:       Result Value   Preg Test, Ur POSITIVE (*)    All other components within normal limits    EKG  EKG Interpretation None       Radiology No results found.  Procedures Procedures (including critical care time)  Medications Ordered in UC Medications - No data to display   Initial Impression / Assessment and Plan / UC Course  I have reviewed the triage vital signs and the nursing notes.  Pertinent labs & imaging results that were available during my care of the patient were reviewed by me and considered in my medical decision making (see chart for details).     Will try triamcinolone application to rash at this time. Continue to follow with PCP if rash does not improve with application. Notified patient of positive pregnancy test. Discussed continued daily prenatal vitamin, regular exercise. Follow up with Obgyn for first prenatal appointment. Patient verbalized understanding and agreeable to plan.    Final Clinical Impressions(s) / UC Diagnoses   Final diagnoses:  Rash  [redacted] weeks gestation of pregnancy    New Prescriptions Discharge Medication List as of 04/28/2017  7:17 PM    START taking these medications   Details  triamcinolone cream (KENALOG) 0.1 % Apply 1 application topically 2 (two) times daily., Starting Mon 04/28/2017, Normal         Controlled Substance Prescriptions [place] Controlled Substance Registry consulted? Not Applicable   Georgetta HaberBurky, Natalie B, NP 04/28/17 2025

## 2017-05-26 ENCOUNTER — Other Ambulatory Visit: Payer: Self-pay

## 2017-05-26 ENCOUNTER — Inpatient Hospital Stay (HOSPITAL_COMMUNITY)
Admission: AD | Admit: 2017-05-26 | Discharge: 2017-05-26 | Disposition: A | Discharge status: Home / Self Care | Payer: Medicaid Other | Source: Ambulatory Visit | Attending: Obstetrics & Gynecology | Admitting: Obstetrics & Gynecology

## 2017-05-26 ENCOUNTER — Encounter (HOSPITAL_COMMUNITY): Payer: Self-pay

## 2017-05-26 ENCOUNTER — Inpatient Hospital Stay (HOSPITAL_COMMUNITY): Payer: Medicaid Other

## 2017-05-26 DIAGNOSIS — Z79899 Other long term (current) drug therapy: Secondary | ICD-10-CM | POA: Diagnosis not present

## 2017-05-26 DIAGNOSIS — Z3A12 12 weeks gestation of pregnancy: Secondary | ICD-10-CM | POA: Diagnosis not present

## 2017-05-26 DIAGNOSIS — O26891 Other specified pregnancy related conditions, first trimester: Secondary | ICD-10-CM | POA: Insufficient documentation

## 2017-05-26 DIAGNOSIS — O209 Hemorrhage in early pregnancy, unspecified: Secondary | ICD-10-CM | POA: Insufficient documentation

## 2017-05-26 DIAGNOSIS — O4690 Antepartum hemorrhage, unspecified, unspecified trimester: Secondary | ICD-10-CM

## 2017-05-26 DIAGNOSIS — B9689 Other specified bacterial agents as the cause of diseases classified elsewhere: Secondary | ICD-10-CM

## 2017-05-26 DIAGNOSIS — N760 Acute vaginitis: Secondary | ICD-10-CM | POA: Diagnosis not present

## 2017-05-26 LAB — URINALYSIS, ROUTINE W REFLEX MICROSCOPIC
BACTERIA UA: NONE SEEN
Bilirubin Urine: NEGATIVE
Glucose, UA: NEGATIVE mg/dL
Hgb urine dipstick: NEGATIVE
KETONES UR: NEGATIVE mg/dL
Nitrite: NEGATIVE
PROTEIN: NEGATIVE mg/dL
Specific Gravity, Urine: 1.015 (ref 1.005–1.030)
pH: 6 (ref 5.0–8.0)

## 2017-05-26 LAB — POCT PREGNANCY, URINE: PREG TEST UR: POSITIVE — AB

## 2017-05-26 MED ORDER — METRONIDAZOLE 500 MG PO TABS: 500.0000 mg | ORAL_TABLET | Freq: Two times a day (BID) | ORAL | 0 refills | Status: AC

## 2017-05-26 NOTE — Discharge Instructions (Signed)

## 2017-05-26 NOTE — MAU Note (Signed)
Pt presents to MAU with c/o of light pink spotting intermittently and lower abdominal cramping for 1 week.

## 2017-05-26 NOTE — MAU Note (Signed)
Chief Complaint: Vaginal Bleeding   None    SUBJECTIVE HPI: Pamela Erickson is a 26 y.o. G3P1001 at 6046w4d who presents to [facility] reporting vaginal spotting. Had intercourse 4 days ago.  Has appt schedule for new ob in a week.    Location: vaginal spotting Quality: small Severity: 2/10 on pain scale Duration: a few days Associated signs and symptoms: has discharge with odor  Past Medical History:  Diagnosis Date  . Abnormal Pap smear    last pap 01/2012  . Anemia    During pregnancy  . Herpes   . 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 Torn ACL (anterior cruciate ligament) 2008   OB History  Gravida Para Term Preterm AB Living  3 1 1     1   SAB TAB Ectopic Multiple Live Births          1    # Outcome Date GA Lbr Len/2nd Weight Sex Delivery Anes PTL Lv  3 Current           2 Term 04/25/13 593w1d   F CS-LTranv EPI  LIV  1 Gravida              Past Surgical History:  Procedure Laterality Date  . ANTERIOR CRUCIATE LIGAMENT REPAIR  2008  . CESAREAN SECTION N/A 04/25/2013   [facility]illard[facility] Naim[facility]   Social History   Socioeconomic History  . Marital status: Single    Spouse name: Not on file  . Number of children: Not on file  . Years of education: 15+  . Highest education level: Not on file  Social Needs  . Financial resource strain: Not on file  . Food insecurity - worry: Not on file  . Food insecurity - inability: Not on file  . Transportation needs - medical: Not on file  . Transportation needs - non-medical: Not on file  Occupational History  . Occupation: FirefighterLIFEGUARD    Employer: [employer]  . Occupation: STUDENT  Tobacco Use  . Smoking status: Never Smoker  . Smokeless tobacco: Never Used  Substance and Sexual Activity  . Alcohol use: Yes    Comment: OCC  . Drug use: Yes    Comment: OCC LAST USE 06/2012  . Sexual activity: Yes    Partners: Male    Birth control/protection: Condom  Other Topics Concern  . Not on file   Social History Narrative  . Not on file   Family History  Problem Relation Age of Onset  . Hypertension Mother   . Hypertension Father   . Diabetes Maternal Aunt    No current facility-administered medications on file prior to encounter.    Current Outpatient Medications on File Prior to Encounter  Medication Sig Dispense Refill  . ferrous sulfate 325 (65 FE) MG tablet Take 325 mg by mouth daily with breakfast.    . guaiFENesin-codeine (ROBITUSSIN AC) 100-10 MG/5ML syrup Take 10 mLs by mouth 4 (four) times daily as needed for cough. 180 mL 0  . ibuprofen (ADVIL,MOTRIN) 600 MG tablet Take 1 tablet (600 mg total) by mouth every 6 (six) hours. 30 tablet 0  . ipratropium (ATROVENT) 0.06 % nasal spray Place 2 sprays into both nostrils 4 (four) times daily. 15 mL 1  . oxyCODONE-acetaminophen (PERCOCET/ROXICET) 5-325 MG per tablet Take 1-2 tablets by mouth every 4 (four) hours as needed. 30 tablet 0  . Prenatal Vit-Fe Fumarate-FA (PRENATAL MULTIVITAMIN) TABS tablet Take 1 tablet by  mouth every morning.    . triamcinolone cream (KENALOG) 0.1 % Apply 1 application topically 2 (two) times daily. 30 g 0  . valACYclovir (VALTREX) 500 MG tablet Take 500 mg by mouth daily.     Allergies  Allergen Reactions  . Ibuprofen     I have reviewed patient's Past Medical Hx, Surgical Hx, Family Hx, Social Hx, medications and allergies.   Review of Systems  Constitutional: Negative.   HENT: Negative.   Eyes: Negative.   Respiratory: Negative.   Cardiovascular: Negative.   Gastrointestinal: Negative.   Endocrine: Negative.   Genitourinary: Positive for vaginal bleeding.  Musculoskeletal: Negative.   Allergic/Immunologic: Negative.   Neurological: Negative.   Hematological: Negative.   Psychiatric/Behavioral: Negative.     OBJECTIVE Patient Vitals for the past 24 hrs:  BP Temp Temp src Pulse Resp Height Weight  05/26/17 1904 127/77 98.3 F (36.8 C) Oral (!) 101 18 5\' 2"  (1.575 m) 89.8 kg (198  lb)   Constitutional: Well-developed, well-nourished female in no acute distress.  Cardiovascular: normal rate Respiratory: normal rate and effort.  GI: Abd soft, non-tender, gravid appropriate for gestational age. Pos BS x 4 MS: Extremities nontender, no edema, normal ROM Neurologic: Alert and oriented x 4.  GU: Neg CVAT.  SPECULUM EXAM: NEFG, white  discharge, no blood noted, cervix clean but friable.  No lesions  BIMANUAL: cervix LTC; uterus normal size, no adnexal tenderness or masses.  No CMT. FHT 160 LAB RESULTS Results for orders placed or performed during the hospital encounter of 05/26/17 (from the past 24 hour(s))  Urinalysis, Routine w reflex microscopic     Status: Abnormal   Collection Time: 05/26/17  6:54 PM  Result Value Ref Range   Color, Urine YELLOW YELLOW   APPearance CLEAR CLEAR   Specific Gravity, Urine 1.015 1.005 - 1.030   pH 6.0 5.0 - 8.0   Glucose, UA NEGATIVE NEGATIVE mg/dL   Hgb urine dipstick NEGATIVE NEGATIVE   Bilirubin Urine NEGATIVE NEGATIVE   Ketones, ur NEGATIVE NEGATIVE mg/dL   Protein, ur NEGATIVE NEGATIVE mg/dL   Nitrite NEGATIVE NEGATIVE   Leukocytes, UA MODERATE (A) NEGATIVE   RBC / HPF 0-5 0 - 5 RBC/hpf   WBC, UA 0-5 0 - 5 WBC/hpf   Bacteria, UA NONE SEEN NONE SEEN   Squamous Epithelial / LPF 0-5 (A) NONE SEEN  Pregnancy, urine POC     Status: Abnormal   Collection Time: 05/26/17  7:21 PM  Result Value Ref Range   Preg Test, Ur POSITIVE (A) NEGATIVE    IMAGING US Ob Comp Less 14 Wks  Result Date: 05/26/2017 CLINICAL DATA:  Spotting and cramping x1 week. EXAM: OBSTETRIC <14 WK ULTRASOUND TECHNIQUE: Transabdominal ultrasound was performed for evaluation of the gestation as well as the maternal uterus and adnexal regions. COMPARISON:  None. FINDINGS: Intrauterine gestational sac: Single Yolk sac:  Not Visualized. Embryo:  Visualized. Cardiac Activity: Visualized. Heart Rate: 167 bpm CRL:   62.3  mm   12 w 4 d                  [place] EDC:  12/04/2017 Subchorionic hemorrhage:  None visualized. Maternal uterus/adnexae: Corpus luteum noted on the right. IMPRESSION: Single live intrauterine gestation at 12 weeks 4 days by ultrasound. Ultrasound [facility] 12/04/2017. No perigestational hematoma is identified. Electronically Signed   By: Tollie Eth M.D.   On: 05/26/2017 19:59    MAU COURSE Orders Placed This Encounter  Procedures  . US OB  Comp Less 14 Wks  . Urinalysis, Routine w reflex microscopic  . Discharge activity:  No Restrictions  . No sexual activity restrictions  . Pregnancy, urine POC  . Discharge patient Discharge disposition: 01-Home or Self Care; Discharge patient date: 05/26/2017   Meds ordered this encounter  Medications  . metroNIDAZOLE (FLAGYL) 500 MG tablet    Sig: Take 1 tablet (500 mg total) 2 (two) times daily for 7 days by mouth.    Dispense:  14 tablet    Refill:  0   Wet mount positive clue, neg trich, neg yeast  MDM PE, pelvic US ASSESSMENT 1. Vaginal bleeding in pregnancy   2. Bacterial vaginitis     PLAN Discharge home in stable condition.  Discussed treatment for BV.  Will use Flagyl for 7 days.  Sent to pharmacy.   Bleeding precautions Follow-up Information    [facility] Follow up in 1 week(s).   Specialty:  Obstetrics and Gynecology Contact information: [address] [place] [phone]         Allergies as of 05/26/2017      Reactions   Ibuprofen       Medication List    STOP taking these medications   ferrous sulfate 325 (65 FE) MG tablet   guaiFENesin-codeine 100-10 MG/5ML syrup Commonly known as:  ROBITUSSIN AC   ibuprofen 600 MG tablet Commonly known as:  ADVIL,MOTRIN   ipratropium 0.06 % nasal spray Commonly known as:  ATROVENT   oxyCODONE-acetaminophen 5-325 MG tablet Commonly known as:  PERCOCET/ROXICET   triamcinolone cream 0.1 % Commonly known as:  KENALOG     TAKE these  medications   metroNIDAZOLE 500 MG tablet Commonly known as:  FLAGYL Take 1 tablet (500 mg total) 2 (two) times daily for 7 days by mouth.   prenatal multivitamin Tabs tablet Take 1 tablet by mouth every morning.   valACYclovir 500 MG tablet Commonly known as:  VALTREX Take 500 mg by mouth daily.        Kenney Housemanrothero, Cori Justus Jean, CNM 05/26/2017  8:47 PM

## 2017-05-27 LAB — GC/CHLAMYDIA PROBE AMP (~~LOC~~) NOT AT ARMC
Chlamydia: NEGATIVE
Neisseria Gonorrhea: NEGATIVE

## 2017-06-05 ENCOUNTER — Other Ambulatory Visit: Payer: Self-pay

## 2017-06-05 ENCOUNTER — Ambulatory Visit (HOSPITAL_COMMUNITY)
Admission: EM | Admit: 2017-06-05 | Discharge: 2017-06-05 | Disposition: A | Discharge status: Home / Self Care | Payer: Medicaid Other | Attending: Internal Medicine | Admitting: Internal Medicine

## 2017-06-05 ENCOUNTER — Encounter (HOSPITAL_COMMUNITY): Payer: Self-pay | Admitting: Emergency Medicine

## 2017-06-05 DIAGNOSIS — H1032 Unspecified acute conjunctivitis, left eye: Secondary | ICD-10-CM | POA: Diagnosis not present

## 2017-06-05 DIAGNOSIS — J069 Acute upper respiratory infection, unspecified: Secondary | ICD-10-CM | POA: Diagnosis not present

## 2017-06-05 DIAGNOSIS — Z3A14 14 weeks gestation of pregnancy: Secondary | ICD-10-CM | POA: Insufficient documentation

## 2017-06-05 DIAGNOSIS — O26892 Other specified pregnancy related conditions, second trimester: Secondary | ICD-10-CM | POA: Insufficient documentation

## 2017-06-05 DIAGNOSIS — J029 Acute pharyngitis, unspecified: Secondary | ICD-10-CM | POA: Diagnosis present

## 2017-06-05 LAB — POCT RAPID STREP A: STREPTOCOCCUS, GROUP A SCREEN (DIRECT): NEGATIVE

## 2017-06-05 MED ORDER — CROMOLYN SODIUM 5.2 MG/ACT NA AERS: 1.0000 | INHALATION_SPRAY | Freq: Four times a day (QID) | NASAL | 0 refills | Status: DC

## 2017-06-05 MED ORDER — ERYTHROMYCIN 5 MG/GM OP OINT: TOPICAL_OINTMENT | OPHTHALMIC | 0 refills | Status: DC

## 2017-06-05 NOTE — ED Provider Notes (Signed)
[facility]    CSN: [id number] Arrival date & time: 06/05/17  1850     History   Chief Complaint Chief Complaint  Patient presents with  . Nasal Congestion  . Conjunctivitis    HPI Pamela Erickson is a 26 y.o. female.   [name] presents with complaints of left eye mattering, discharge and itching which started two days ago. Denies vision change or pain. She works at a daycare. Yellow discharge. Also complaints of sore throat which started approximately 4-5 days ago and seems to be worsening. Denies fevers. Headache developed today. Has not taken any medications for her symptoms. She is 14 weeks pregnancy. Mild cough, non productive. Without shortness of breath or chest pain. Mild runny nose. Denies abdominal pain or vaginal bleeding. No changes to gi/gu, without complaints.    ROS per HPI.       Past Medical History:  Diagnosis Date  . Abnormal Pap smear    last pap 01/2012  . Anemia    During pregnancy  . Herpes   . 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 Torn ACL (anterior cruciate ligament) 2008    Patient Active Problem List   Diagnosis Date Noted  . 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 Vaginal bleeding before [redacted] weeks gestation 10/07/2012    Past Surgical History:  Procedure Laterality Date  . ANTERIOR CRUCIATE LIGAMENT REPAIR  2008  . CESAREAN SECTION N/A 04/25/2013   Procedure: CESAREAN SECTION;  Surgeon: Michael LitterNaima A Dillard, MD;  Location: [facility];  Service: Obstetrics;  Laterality: N/A;    OB History    Gravida Para Term Preterm AB Living   2 1 1     1    SAB TAB Ectopic Multiple Live Births           1       Home Medications    Prior to Admission medications   Medication Sig Start Date End Date Taking? Authorizing Provider  Prenatal Vit-Fe Fumarate-FA (PRENATAL MULTIVITAMIN) TABS tablet Take 1 tablet by mouth every morning.   Yes [provider]  valACYclovir (VALTREX) 500 MG tablet Take 500 mg by mouth daily.   Yes [provider]  cromolyn (NASALCROM) 5.2 MG/ACT nasal spray Place 1 spray into both nostrils 4 (four) times daily. 06/05/17   Georgetta HaberBurky, Natalie B, NP  erythromycin ophthalmic ointment Place a 1/2 inch ribbon of ointment into the lower eyelid. 06/05/17   Georgetta HaberBurky, Natalie B, NP    Family History Family History  Problem Relation Age of Onset  . Hypertension Mother   . Hypertension Father   . Diabetes Maternal Aunt     Social History Social History   Tobacco Use  . Smoking status: Never Smoker  . Smokeless tobacco: Never Used  Substance Use Topics  . Alcohol use: Yes    Comment: OCC  . Drug use: Yes    Comment: OCC LAST USE 06/2012     Allergies   Ibuprofen   Review of Systems Review of Systems   Physical Exam Triage Vital Signs ED Triage Vitals  Enc Vitals Group     BP 06/05/17 1913 125/67     Pulse Rate 06/05/17 1913 (!) 101     Resp 06/05/17 1913 16     Temp 06/05/17 1913 99 F (37.2 C)     Temp src --      SpO2 06/05/17 1913 100 %     Weight --      Height --  Head Circumference --      Peak Flow --      Pain Score 06/05/17 1915 8     Pain Loc --      Pain Edu? --      Excl. in GC? --    No data found.  Updated Vital Signs BP 125/67   Pulse (!) 101   Temp 99 F (37.2 C)   Resp 16   LMP  (Approximate)   SpO2 100%   Visual Acuity Right Eye Distance:   Left Eye Distance:   Bilateral Distance:    Right Eye Near:   Left Eye Near:    Bilateral Near:     Physical Exam  Constitutional: She is oriented to person, place, and time. She appears well-developed and well-nourished. No distress.  HENT:  Head: Normocephalic and atraumatic.  Right Ear: Tympanic membrane, external ear and ear canal normal.  Left Ear: Tympanic membrane, external ear and ear canal normal.  Nose: Nose normal.  Mouth/Throat: Uvula is midline, oropharynx is clear and moist and mucous membranes are normal. No tonsillar exudate.  Eyes: EOM and lids are normal. Pupils are equal,  round, and reactive to light. Left conjunctiva is injected.  Currently left eye without discharge but redness noted to conjunctiva  Cardiovascular: Normal rate, regular rhythm and normal heart sounds.  Pulmonary/Chest: Effort normal and breath sounds normal.  Neurological: She is alert and oriented to person, place, and time.  Skin: Skin is warm and dry.     UC Treatments / Results  Labs (all labs ordered are listed, but only abnormal results are displayed) Labs Reviewed  CULTURE, GROUP A STREP [facility]  POCT RAPID STREP A    EKG  EKG Interpretation None       Radiology No results found.  Procedures Procedures (including critical care time)  Medications Ordered in UC Medications - No data to display   Initial Impression / Assessment and Plan / UC Course  I have reviewed the triage vital signs and the nursing notes.  Pertinent labs & imaging results that were available during my care of the patient were reviewed by me and considered in my medical decision making (see chart for details).     Negative strep, consistent with history and exam. Likely viral in nature. Supportive cares recommended, nasal spray provided to help with symptoms. tylenol as needed for pain.  Eye ointment for left eye, to avoid touching of eye, not to go to work tomorrow. Return precautions provided.Patient verbalized understanding and agreeable to plan.     Final Clinical Impressions(s) / UC Diagnoses   Final diagnoses:  Viral upper respiratory tract infection  Acute conjunctivitis of left eye, unspecified acute conjunctivitis type    ED Discharge Orders        Ordered    erythromycin ophthalmic ointment     06/05/17 1928    cromolyn (NASALCROM) 5.2 MG/ACT nasal spray  4 times daily     06/05/17 1928       Controlled Substance Prescriptions  Controlled Substance Registry consulted? Not Applicable   Georgetta HaberBurky, Natalie B, NP 06/05/17 1935

## 2017-06-05 NOTE — ED Triage Notes (Signed)
Pt states "ive been having a pink eye on the left, sore throat and headaches, and nasal congestion." Pt is also [redacted] weeks pregnant, denies any issues with pregnancy.

## 2017-06-05 NOTE — Discharge Instructions (Signed)
Tylenol as needed for headache. I have sent a nasal spray to the pharmacy which should help with congestion and throat symptoms. Push fluids to ensure adequate hydration and keep secretions thin.  Antibiotic ointment to left eye. Avoid touching the right eye.  If symptoms worsen, develop fevers, abdominal pain, shortness of breath or do not improve in the next week to return to be seen or to follow up with PCP.

## 2017-06-08 LAB — CULTURE, GROUP A STREP (THRC)

## 2017-06-09 LAB — OB RESULTS CONSOLE RUBELLA ANTIBODY, IGM: Rubella: IMMUNE

## 2017-06-09 LAB — OB RESULTS CONSOLE ABO/RH: RH Type: POSITIVE

## 2017-06-09 LAB — OB RESULTS CONSOLE HEPATITIS B SURFACE ANTIGEN: Hepatitis B Surface Ag: NEGATIVE

## 2017-06-09 LAB — OB RESULTS CONSOLE HIV ANTIBODY (ROUTINE TESTING): HIV: NONREACTIVE

## 2017-06-09 LAB — OB RESULTS CONSOLE ANTIBODY SCREEN: ANTIBODY SCREEN: NEGATIVE

## 2017-06-09 LAB — OB RESULTS CONSOLE RPR: RPR: NONREACTIVE

## 2017-06-09 LAB — OB RESULTS CONSOLE GC/CHLAMYDIA
CHLAMYDIA, DNA PROBE: NEGATIVE
GC PROBE AMP, GENITAL: NEGATIVE

## 2017-06-12 ENCOUNTER — Ambulatory Visit (HOSPITAL_COMMUNITY)
Admission: EM | Admit: 2017-06-12 | Discharge: 2017-06-12 | Disposition: A | Discharge status: Home / Self Care | Payer: Medicaid Other | Attending: Family Medicine | Admitting: Family Medicine

## 2017-06-12 ENCOUNTER — Encounter (HOSPITAL_COMMUNITY): Payer: Self-pay | Admitting: Emergency Medicine

## 2017-06-12 ENCOUNTER — Other Ambulatory Visit: Payer: Self-pay

## 2017-06-12 DIAGNOSIS — J0100 Acute maxillary sinusitis, unspecified: Secondary | ICD-10-CM | POA: Diagnosis not present

## 2017-06-12 MED ORDER — AMOXICILLIN 500 MG PO CAPS: 1000.0000 mg | ORAL_CAPSULE | Freq: Two times a day (BID) | ORAL | 0 refills | Status: AC

## 2017-06-12 NOTE — ED Triage Notes (Signed)
Pt was seen here on 11/29, c/o contniued symptoms, was told to come back fi still feeling bad. C/o facial pain. Congestion.

## 2017-06-12 NOTE — ED Provider Notes (Signed)
  [facility]   [id number] 06/12/17 Arrival Time: 1343  ASSESSMENT & PLAN:  1. Acute non-recurrent maxillary sinusitis     Meds ordered this encounter  Medications  . amoxicillin (AMOXIL) 500 MG capsule    Sig: Take 2 capsules (1,000 mg total) by mouth 2 (two) times daily for 10 days.    Dispense:  40 capsule    Refill:  0   OTC symptom care as needed.  Reviewed expectations re: course of current medical issues. Questions answered. Outlined signs and symptoms indicating need for more acute intervention. Patient verbalized understanding. After Visit Summary given.   SUBJECTIVE:  Pamela Erickson is a 26 y.o. female who presents with complaint of nasal congestion, post-nasal drainage. Onset approximately 1-1.5 weeks ago. Overall fatigued. SOB: none. Wheezing: none. Fever: no. Overall normal PO intake without n/v. Sick contacts: no. Seen recently and still not improving. OTC decongestant with mild help.  Social History   Tobacco Use  Smoking Status Never Smoker  Smokeless Tobacco Never Used    ROS: As per HPI.   OBJECTIVE:  Vitals:   06/12/17 1354  BP: 119/72  Pulse: (!) 101  Resp: 18  Temp: 97.9 F (36.6 C)  SpO2: 100%    General appearance: alert; no distress HEENT: nasal congestion; clear runny nose; throat irritation secondary to post-nasal drainage; maxillary sinus tenderness bilaterally Neck: supple without LAD Lungs: clear to auscultation bilaterally; no cough Skin: warm and dry Psychological: alert and cooperative; normal mood and affect   Allergies  Allergen Reactions  . Ibuprofen     Past Medical History:  Diagnosis Date  . Abnormal Pap smear    last pap 01/2012  . Anemia    During pregnancy  . Herpes   . 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 Torn ACL (anterior cruciate ligament) 2008    Family History  Problem Relation Age of Onset  . Hypertension Mother   . Hypertension Father   . Diabetes Maternal Aunt     Social  History   Socioeconomic History  . Marital status: Single    Spouse name: Not on file  . Number of children: Not on file  . Years of education: 15+  . Highest education level: Not on file  Social Needs  . Financial resource strain: Not on file  . Food insecurity - worry: Not on file  . Food insecurity - inability: Not on file  . Transportation needs - medical: Not on file  . Transportation needs - non-medical: Not on file  Occupational History  . Occupation: FirefighterLIFEGUARD    Employer: [employer]  . Occupation: STUDENT  Tobacco Use  . Smoking status: Never Smoker  . Smokeless tobacco: Never Used  Substance and Sexual Activity  . Alcohol use: Yes    Comment: OCC  . Drug use: Yes    Comment: OCC LAST USE 06/2012  . Sexual activity: Yes    Partners: Male    Birth control/protection: Condom  Other Topics Concern  . Not on file  Social History Narrative  . Not on file           [name] LaymanHagler, Metta Koranda, MD 06/15/17 1158

## 2017-07-08 NOTE — L&D Delivery Note (Signed)
Delivery Note At  a non-viable female was delivered via  (Presentation:breech ;  ). Weight pending  .   Placenta status:complete , .  Cord:  with the following complications:Third degree heart block with hydrops Anesthesia:  Epidural Episiotomy:  None Lacerations:  None Est. Blood Loss (mL):150cc    Mom to postpartum.  [place] [facility]  Henderson Newcomerancy Jean Prothero 08/02/2017, 6:42 AM

## 2017-07-18 ENCOUNTER — Other Ambulatory Visit (HOSPITAL_COMMUNITY): Payer: Self-pay | Admitting: Obstetrics & Gynecology

## 2017-07-18 ENCOUNTER — Encounter (HOSPITAL_COMMUNITY): Payer: Self-pay

## 2017-07-18 ENCOUNTER — Ambulatory Visit (HOSPITAL_COMMUNITY)
Admission: RE | Admit: 2017-07-18 | Discharge: 2017-07-18 | Disposition: A | Discharge status: Home / Self Care | Payer: Medicaid Other | Source: Ambulatory Visit | Attending: Obstetrics & Gynecology | Admitting: Obstetrics & Gynecology

## 2017-07-18 DIAGNOSIS — O99412 Diseases of the circulatory system complicating pregnancy, second trimester: Secondary | ICD-10-CM | POA: Diagnosis not present

## 2017-07-18 DIAGNOSIS — Z363 Encounter for antenatal screening for malformations: Secondary | ICD-10-CM

## 2017-07-18 DIAGNOSIS — R188 Other ascites: Secondary | ICD-10-CM | POA: Diagnosis not present

## 2017-07-18 DIAGNOSIS — Z3689 Encounter for other specified antenatal screening: Secondary | ICD-10-CM | POA: Insufficient documentation

## 2017-07-18 DIAGNOSIS — I442 Atrioventricular block, complete: Secondary | ICD-10-CM | POA: Insufficient documentation

## 2017-07-18 DIAGNOSIS — O34219 Maternal care for unspecified type scar from previous cesarean delivery: Secondary | ICD-10-CM | POA: Insufficient documentation

## 2017-07-18 DIAGNOSIS — Z3A20 20 weeks gestation of pregnancy: Secondary | ICD-10-CM

## 2017-07-18 DIAGNOSIS — Z3A19 19 weeks gestation of pregnancy: Secondary | ICD-10-CM

## 2017-07-18 DIAGNOSIS — I313 Pericardial effusion (noninflammatory): Secondary | ICD-10-CM | POA: Diagnosis not present

## 2017-07-20 DIAGNOSIS — Z98891 History of uterine scar from previous surgery: Secondary | ICD-10-CM | POA: Insufficient documentation

## 2017-07-21 ENCOUNTER — Other Ambulatory Visit (HOSPITAL_COMMUNITY): Payer: Self-pay | Admitting: *Deleted

## 2017-07-21 DIAGNOSIS — O35BXX0 Maternal care for other (suspected) fetal abnormality and damage, fetal cardiac anomalies, not applicable or unspecified: Secondary | ICD-10-CM

## 2017-07-21 DIAGNOSIS — O358XX0 Maternal care for other (suspected) fetal abnormality and damage, not applicable or unspecified: Secondary | ICD-10-CM

## 2017-07-23 DIAGNOSIS — Z3A20 20 weeks gestation of pregnancy: Secondary | ICD-10-CM | POA: Diagnosis not present

## 2017-07-23 DIAGNOSIS — Q246 Congenital heart block: Secondary | ICD-10-CM | POA: Diagnosis not present

## 2017-07-24 ENCOUNTER — Encounter (HOSPITAL_COMMUNITY): Payer: Self-pay

## 2017-07-24 ENCOUNTER — Other Ambulatory Visit (HOSPITAL_COMMUNITY): Payer: Self-pay | Admitting: Maternal and Fetal Medicine

## 2017-07-24 ENCOUNTER — Ambulatory Visit (HOSPITAL_COMMUNITY)
Admission: RE | Admit: 2017-07-24 | Discharge: 2017-07-24 | Disposition: A | Discharge status: Home / Self Care | Payer: Medicaid Other | Source: Ambulatory Visit | Attending: Obstetrics & Gynecology | Admitting: Obstetrics & Gynecology

## 2017-07-24 DIAGNOSIS — R600 Localized edema: Secondary | ICD-10-CM | POA: Insufficient documentation

## 2017-07-24 DIAGNOSIS — O34219 Maternal care for unspecified type scar from previous cesarean delivery: Secondary | ICD-10-CM | POA: Diagnosis not present

## 2017-07-24 DIAGNOSIS — O4422 Partial placenta previa NOS or without hemorrhage, second trimester: Secondary | ICD-10-CM | POA: Insufficient documentation

## 2017-07-24 DIAGNOSIS — O321XX0 Maternal care for breech presentation, not applicable or unspecified: Secondary | ICD-10-CM | POA: Insufficient documentation

## 2017-07-24 DIAGNOSIS — Z3A21 21 weeks gestation of pregnancy: Secondary | ICD-10-CM | POA: Diagnosis not present

## 2017-07-24 DIAGNOSIS — O358XX0 Maternal care for other (suspected) fetal abnormality and damage, not applicable or unspecified: Secondary | ICD-10-CM

## 2017-07-24 DIAGNOSIS — R188 Other ascites: Secondary | ICD-10-CM | POA: Insufficient documentation

## 2017-07-24 DIAGNOSIS — O35BXX0 Maternal care for other (suspected) fetal abnormality and damage, fetal cardiac anomalies, not applicable or unspecified: Secondary | ICD-10-CM

## 2017-07-24 DIAGNOSIS — Z362 Encounter for other antenatal screening follow-up: Secondary | ICD-10-CM | POA: Insufficient documentation

## 2017-07-24 DIAGNOSIS — O2690 Pregnancy related conditions, unspecified, unspecified trimester: Secondary | ICD-10-CM

## 2017-07-24 DIAGNOSIS — O2692 Pregnancy related conditions, unspecified, second trimester: Secondary | ICD-10-CM | POA: Insufficient documentation

## 2017-07-25 ENCOUNTER — Other Ambulatory Visit (HOSPITAL_COMMUNITY): Payer: Self-pay | Admitting: *Deleted

## 2017-07-25 DIAGNOSIS — O359XX0 Maternal care for (suspected) fetal abnormality and damage, unspecified, not applicable or unspecified: Secondary | ICD-10-CM

## 2017-07-31 ENCOUNTER — Telehealth (HOSPITAL_COMMUNITY): Payer: Self-pay | Admitting: *Deleted

## 2017-07-31 ENCOUNTER — Ambulatory Visit (HOSPITAL_COMMUNITY)
Admission: RE | Admit: 2017-07-31 | Discharge: 2017-07-31 | Disposition: A | Discharge status: Home / Self Care | Payer: Medicaid Other | Source: Ambulatory Visit | Attending: Obstetrics & Gynecology | Admitting: Obstetrics & Gynecology

## 2017-07-31 ENCOUNTER — Encounter (HOSPITAL_COMMUNITY): Payer: Self-pay

## 2017-07-31 ENCOUNTER — Encounter (HOSPITAL_COMMUNITY): Payer: Self-pay | Admitting: *Deleted

## 2017-07-31 ENCOUNTER — Other Ambulatory Visit (HOSPITAL_COMMUNITY): Payer: Self-pay | Admitting: Obstetrics and Gynecology

## 2017-07-31 DIAGNOSIS — I509 Heart failure, unspecified: Secondary | ICD-10-CM

## 2017-07-31 DIAGNOSIS — I442 Atrioventricular block, complete: Secondary | ICD-10-CM | POA: Insufficient documentation

## 2017-07-31 DIAGNOSIS — O34219 Maternal care for unspecified type scar from previous cesarean delivery: Secondary | ICD-10-CM

## 2017-07-31 DIAGNOSIS — R188 Other ascites: Secondary | ICD-10-CM | POA: Insufficient documentation

## 2017-07-31 DIAGNOSIS — O364XX0 Maternal care for intrauterine death, not applicable or unspecified: Secondary | ICD-10-CM

## 2017-07-31 DIAGNOSIS — O4442 Low lying placenta NOS or without hemorrhage, second trimester: Secondary | ICD-10-CM | POA: Insufficient documentation

## 2017-07-31 DIAGNOSIS — O2692 Pregnancy related conditions, unspecified, second trimester: Secondary | ICD-10-CM

## 2017-07-31 DIAGNOSIS — O359XX0 Maternal care for (suspected) fetal abnormality and damage, unspecified, not applicable or unspecified: Secondary | ICD-10-CM

## 2017-07-31 DIAGNOSIS — O321XX0 Maternal care for breech presentation, not applicable or unspecified: Secondary | ICD-10-CM

## 2017-07-31 DIAGNOSIS — Z3A22 22 weeks gestation of pregnancy: Secondary | ICD-10-CM

## 2017-07-31 DIAGNOSIS — Z362 Encounter for other antenatal screening follow-up: Secondary | ICD-10-CM | POA: Insufficient documentation

## 2017-07-31 DIAGNOSIS — O289 Unspecified abnormal findings on antenatal screening of mother: Secondary | ICD-10-CM

## 2017-07-31 DIAGNOSIS — O2690 Pregnancy related conditions, unspecified, unspecified trimester: Secondary | ICD-10-CM

## 2017-07-31 NOTE — Telephone Encounter (Signed)
Preadmission screen  

## 2017-08-01 ENCOUNTER — Inpatient Hospital Stay (HOSPITAL_COMMUNITY)
Admission: RE | Admit: 2017-08-01 | Discharge: 2017-08-02 | DRG: 807 | Disposition: A | Discharge status: Home / Self Care | Payer: Medicaid Other | Source: Ambulatory Visit | Attending: Obstetrics and Gynecology | Admitting: Obstetrics and Gynecology

## 2017-08-01 ENCOUNTER — Encounter (HOSPITAL_COMMUNITY): Payer: Self-pay

## 2017-08-01 DIAGNOSIS — O9902 Anemia complicating childbirth: Secondary | ICD-10-CM | POA: Diagnosis present

## 2017-08-01 DIAGNOSIS — O364XX0 Maternal care for intrauterine death, not applicable or unspecified: Secondary | ICD-10-CM | POA: Diagnosis present

## 2017-08-01 DIAGNOSIS — D573 Sickle-cell trait: Secondary | ICD-10-CM | POA: Diagnosis present

## 2017-08-01 DIAGNOSIS — O358XX0 Maternal care for other (suspected) fetal abnormality and damage, not applicable or unspecified: Secondary | ICD-10-CM | POA: Diagnosis present

## 2017-08-01 DIAGNOSIS — Z3A21 21 weeks gestation of pregnancy: Secondary | ICD-10-CM | POA: Diagnosis not present

## 2017-08-01 HISTORY — DX: Unspecified abnormal cytological findings in specimens from vagina: R87.629

## 2017-08-01 LAB — CBC
HCT: 29.7 % — ABNORMAL LOW (ref 36.0–46.0)
Hemoglobin: 10.2 g/dL — ABNORMAL LOW (ref 12.0–15.0)
MCH: 26.1 pg (ref 26.0–34.0)
MCHC: 34.3 g/dL (ref 30.0–36.0)
MCV: 76 fL — ABNORMAL LOW (ref 78.0–100.0)
PLATELETS: 344 10*3/uL (ref 150–400)
RBC: 3.91 MIL/uL (ref 3.87–5.11)
RDW: 14.3 % (ref 11.5–15.5)
WBC: 8.8 10*3/uL (ref 4.0–10.5)

## 2017-08-01 LAB — RAPID HIV SCREEN (HIV 1/2 AB+AG)
HIV 1/2 ANTIBODIES: NONREACTIVE
HIV-1 P24 ANTIGEN - HIV24: NONREACTIVE

## 2017-08-01 LAB — TYPE AND SCREEN
ABO/RH(D): A POS
Antibody Screen: NEGATIVE

## 2017-08-01 MED ORDER — LACTATED RINGERS IV SOLN
500.0000 mL | Freq: Once | INTRAVENOUS | Status: AC
  Administered 2017-08-02: 500 mL via INTRAVENOUS

## 2017-08-01 MED ORDER — MISOPROSTOL 200 MCG PO TABS: 200.0000 ug | ORAL_TABLET | ORAL | Status: DC | PRN

## 2017-08-01 MED ORDER — DIPHENHYDRAMINE HCL 50 MG/ML IJ SOLN: 12.5000 mg | INTRAMUSCULAR | Status: DC | PRN

## 2017-08-01 MED ORDER — SOD CITRATE-CITRIC ACID 500-334 MG/5ML PO SOLN: 30.0000 mL | ORAL | Status: DC | PRN

## 2017-08-01 MED ORDER — OXYTOCIN BOLUS FROM INFUSION
500.0000 mL | Freq: Once | INTRAVENOUS | Status: AC
  Administered 2017-08-02: 500 mL via INTRAVENOUS

## 2017-08-01 MED ORDER — ACETAMINOPHEN 325 MG PO TABS: 650.0000 mg | ORAL_TABLET | ORAL | Status: DC | PRN

## 2017-08-01 MED ORDER — FENTANYL CITRATE (PF) 100 MCG/2ML IJ SOLN
50.0000 ug | INTRAMUSCULAR | Status: DC | PRN
  Administered 2017-08-01 – 2017-08-02 (×3): 100 ug via INTRAVENOUS
  Filled 2017-08-01 (×3): qty 2

## 2017-08-01 MED ORDER — OXYCODONE-ACETAMINOPHEN 5-325 MG PO TABS: 2.0000 | ORAL_TABLET | ORAL | Status: DC | PRN

## 2017-08-01 MED ORDER — LACTATED RINGERS IV SOLN: 500.0000 mL | INTRAVENOUS | Status: DC | PRN

## 2017-08-01 MED ORDER — FLEET ENEMA 7-19 GM/118ML RE ENEM: 1.0000 | ENEMA | RECTAL | Status: DC | PRN

## 2017-08-01 MED ORDER — LACTATED RINGERS IV SOLN
INTRAVENOUS | Status: DC
  Administered 2017-08-01 (×2): via INTRAVENOUS

## 2017-08-01 MED ORDER — MISOPROSTOL 200 MCG PO TABS
200.0000 ug | ORAL_TABLET | ORAL | Status: AC | PRN
  Administered 2017-08-01 (×3): 200 ug via VAGINAL
  Filled 2017-08-01 (×3): qty 1

## 2017-08-01 MED ORDER — PHENYLEPHRINE 40 MCG/ML (10ML) SYRINGE FOR IV PUSH (FOR BLOOD PRESSURE SUPPORT)
80.0000 ug | PREFILLED_SYRINGE | INTRAVENOUS | Status: DC | PRN
  Filled 2017-08-01: qty 10

## 2017-08-01 MED ORDER — OXYCODONE-ACETAMINOPHEN 5-325 MG PO TABS: 1.0000 | ORAL_TABLET | ORAL | Status: DC | PRN

## 2017-08-01 MED ORDER — ONDANSETRON HCL 4 MG/2ML IJ SOLN: 4.0000 mg | Freq: Four times a day (QID) | INTRAMUSCULAR | Status: DC | PRN

## 2017-08-01 MED ORDER — LIDOCAINE HCL (PF) 1 % IJ SOLN: 30.0000 mL | INTRAMUSCULAR | Status: DC | PRN

## 2017-08-01 MED ORDER — EPHEDRINE 5 MG/ML INJ: 10.0000 mg | INTRAVENOUS | Status: DC | PRN

## 2017-08-01 MED ORDER — FENTANYL 2.5 MCG/ML BUPIVACAINE 1/10 % EPIDURAL INFUSION (WH - ANES)
14.0000 mL/h | INTRAMUSCULAR | Status: DC | PRN
  Filled 2017-08-01: qty 100

## 2017-08-01 MED ORDER — PHENYLEPHRINE 40 MCG/ML (10ML) SYRINGE FOR IV PUSH (FOR BLOOD PRESSURE SUPPORT): 80.0000 ug | PREFILLED_SYRINGE | INTRAVENOUS | Status: DC | PRN

## 2017-08-01 MED ORDER — OXYTOCIN 40 UNITS IN LACTATED RINGERS INFUSION - SIMPLE MED
2.5000 [IU]/h | INTRAVENOUS | Status: DC
  Administered 2017-08-02: 2.5 [IU]/h via INTRAVENOUS
  Filled 2017-08-01: qty 1000

## 2017-08-01 NOTE — Anesthesia Pain Management Evaluation Note (Signed)
  [facility] Pain Management Visit Note  Patient: Pamela Erickson, 27 y.o., female  "Hello I am a member of the anesthesia team at [facility]. We have an anesthesia team available at all times to provide care throughout the hospital, including epidural management and anesthesia for C-section. I don't know your plan for the delivery whether it a natural birth, water birth, IV sedation, nitrous supplementation, doula or epidural, but we want to meet your pain goals."   1.Was your pain managed to your expectations on prior hospitalizations?   Yes   2.What is your expectation for pain management during this hospitalization?     Epidural  3.How can we help you reach that goal? Epidural when ready.  Record the patient's initial score and the patient's pain goal.   Pain: 0  Pain Goal: 4 [facility] wants you to be able to say your pain was always managed very well.  Pamela Erickson 08/01/2017

## 2017-08-01 NOTE — H&P (Signed)
Pamela Erickson is a 27 y.o. female, G2P1001 at 21.5 weeks, presenting for IOL s/t IUFD. Patient seen at MFM on 07/31/17 with discovery of fetal death.  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    Patient Active Problem List   Diagnosis Date Noted  . Sickle cell trait (HCC) 08/01/2017  . IUFD at 20 weeks or more of gestation 08/01/2017  . 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 Vaginal bleeding before [redacted] weeks gestation 10/07/2012    History of present pregnancy: Patient entered care at 14.1 weeks.   EDC of 12/13/17 was established by Definite LMP of 03/02/17.   Anatomy scan:  19 weeks, with abnormal findings including fetal bradycardia, enlarged atrium, abdominal ascites, pericardial effusion, Outflow tracts NOT seen. Kidneys NOT seen. Spine NOT seen. Cisterna Magna NOT seen  Additional US evaluations:   MFM on  07/18/17: SIUP at 20+1 weeks Bradyarrhythmia - essentially 3rd degree heart block (occasional conducted beat visualized) with hydrops (mild pericardial effusion and mild/moderate ascites)    Last evaluation:  07/31/17 by MFM-Dr. Sherrie Georgeecker  OB History    Gravida Para Term Preterm AB Living   2 1 1     1    SAB TAB Ectopic Multiple Live Births           1     Past Medical History:  Diagnosis Date  . Abnormal Pap smear    last pap 01/2012  . Anemia    During pregnancy  . Herpes   . 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 Torn ACL (anterior cruciate ligament) 2008   Past Surgical History:  Procedure Laterality Date  . ANTERIOR CRUCIATE LIGAMENT REPAIR  2008  . CESAREAN SECTION N/A 04/25/2013   Procedure: CESAREAN SECTION;  Surgeon: Michael LitterNaima A Dillard, MD;  Location: [facility];  Service: Obstetrics;  Laterality: N/A;   Family History: family history includes Diabetes in her maternal aunt; Hypertension in her father and mother. Social History:  reports that  has never smoked. she has never used smokeless tobacco. She reports that she does not drink alcohol or use drugs.   Prenatal Transfer Tool   Maternal Diabetes: No Genetic Screening: Declined Maternal Ultrasounds/Referrals: Abnormal:  Findings:   Fetal Kidney Anomalies, Fetal Heart Anomalies Fetal Ultrasounds or other Referrals:  Referred to Materal Fetal Medicine  Maternal Substance Abuse:  No Significant Maternal Medications:  None Significant Maternal Lab Results: None    ROS:  No complaints via phone   Allergies  Allergen Reactions  . Ibuprofen        Last menstrual period 03/02/2017, unknown if currently breastfeeding.  Physical Exam  TBD   Presentation: Breech   Prenatal labs: ABO, Rh: A/Positive/-- (12/03 0000) Antibody: Negative (12/03 0000) Rubella:  Immune RPR: Nonreactive (12/03 0000)  HBsAg: Negative (12/03 0000)  HIV: Non-reactive (12/03 0000)  GBS:  Unknown Sickle cell/Hgb electrophoresis:  SS  Pap:  None GC:  ND Chlamydia:  ND Other:  None    Assessment IUP at 21.5wks IUFD   [facility]: [facility] with Dr. Cloretta NedS.Rivard Plan for cytotec induction per consult with Dr. Sherrie Georgeecker Dr.N.Dillard to assume care and assess as appropriate  [name], [name] 08/01/2017, 6:41 AM

## 2017-08-01 NOTE — Progress Notes (Signed)
I offered support to Pamela Erickson and her family (parents and both grandparents as well as her fiance (FOB)) throughout the day over a period of several visits.  [name] was very much anticipated by everyone including her 27 year old daughter, Pamela Erickson.  They have been processing over the past several weeks after receiving the diagnosis and since finding out yesterday that there was no heartbeat.  Pamela Erickson was tearful at times.  We talked about what to expect with the delivery itself and I encouraged her to think about what she might want to do as a family while [name] is with them.  She wants her daughter to come to the hospital and we talked about how to talk with her daughter about death.  I also helped facilitate her choosing an outfit for her baby from our comfort team supplies.  I let her know of our ongoing availability and the resource of [facility] for grief counseling for her daughter.    Chaplain Dyanne CarrelKaty Alexsandro Erickson, Bcc PAger, [phone] 3:08 PM    08/01/17 1500  Clinical Encounter Type  Visited With Patient and family together  Visit Type Spiritual support  Referral From Nurse  Spiritual Encounters  Spiritual Needs Ritual;Emotional;Grief support  Stress Factors  Patient Stress Factors Loss

## 2017-08-01 NOTE — Progress Notes (Signed)
CSW received consult for IUFD support.  CSW will defer to Chaplain for support and is available for additional support/mental health resources as needed.  CSW spoke with Chaplain/K. Claussen who is aware of situation.

## 2017-08-01 NOTE — Progress Notes (Signed)
Subjective: Pt starting to feel some cramping and pressure.  Having some bloody show. FOB in room and supportive.  Objective: BP 121/65   Pulse (!) 113   Temp 99.2 F (37.3 C) (Oral)   Resp 18   Ht 5\' 2"  (1.575 m)   Wt 93.9 kg (207 lb 1.9 oz)   LMP 03/02/2017 (Exact Date)   BMI 37.88 kg/m  No intake/output data recorded. No intake/output data recorded.    SVE:   Dilation: 1 Effacement (%): 50 Station: -3 Exam by:: Harriett SineNancy P, CNM  200 mcg cytotec placed Assessment:  IUFD with hydrops at 22 weeks Low lying posterior placenta  Plan: Epidural when requests  Kenney HousemanNancy Jean Keysi Oelkers CNM, [name] 08/01/2017, 8:58 PM

## 2017-08-01 NOTE — Progress Notes (Signed)
Pt is teary and hungry BP 116/64   Pulse (!) 116   Temp 98.2 F (36.8 C) (Oral)   Resp 18   Ht 5\' 2"  (1.575 m)   Wt 93.9 kg (207 lb 1.9 oz)   LMP 03/02/2017 (Exact Date)   BMI 37.88 kg/m  cv rrr Lungs CTA B abd nd soft NT IUFD with hydrops at 22 weeks Low lying posterior placenta Per MFM cytotec induction Pt had a CS in the past told small risk of uterine rupture with cytotec she still desires to proceed Pt also has low lying posterior placenta.  Will monitor for bleeding.  She underdtands she may need a D&E if bleeding becomes heavy.

## 2017-08-02 ENCOUNTER — Inpatient Hospital Stay (HOSPITAL_COMMUNITY): Payer: Medicaid Other | Admitting: Anesthesiology

## 2017-08-02 ENCOUNTER — Encounter (HOSPITAL_COMMUNITY): Payer: Self-pay

## 2017-08-02 LAB — CBC
HCT: 28.1 % — ABNORMAL LOW (ref 36.0–46.0)
HEMOGLOBIN: 9.8 g/dL — AB (ref 12.0–15.0)
MCH: 26.1 pg (ref 26.0–34.0)
MCHC: 34.9 g/dL (ref 30.0–36.0)
MCV: 74.7 fL — AB (ref 78.0–100.0)
PLATELETS: 290 10*3/uL (ref 150–400)
RBC: 3.76 MIL/uL — AB (ref 3.87–5.11)
RDW: 14.2 % (ref 11.5–15.5)
WBC: 6.9 10*3/uL (ref 4.0–10.5)

## 2017-08-02 LAB — RPR: RPR Ser Ql: NONREACTIVE

## 2017-08-02 MED ORDER — DIBUCAINE 1 % RE OINT
1.0000 "application " | TOPICAL_OINTMENT | RECTAL | Status: DC | PRN
  Filled 2017-08-02: qty 28

## 2017-08-02 MED ORDER — WITCH HAZEL-GLYCERIN EX PADS: 1.0000 "application " | MEDICATED_PAD | CUTANEOUS | Status: DC | PRN

## 2017-08-02 MED ORDER — METHYLERGONOVINE MALEATE 0.2 MG PO TABS
0.2000 mg | ORAL_TABLET | Freq: Once | ORAL | Status: AC
  Administered 2017-08-02: 0.2 mg via ORAL
  Filled 2017-08-02: qty 1

## 2017-08-02 MED ORDER — ONDANSETRON HCL 4 MG PO TABS: 4.0000 mg | ORAL_TABLET | ORAL | Status: DC | PRN

## 2017-08-02 MED ORDER — ZOLPIDEM TARTRATE 5 MG PO TABS: 5.0000 mg | ORAL_TABLET | Freq: Every evening | ORAL | Status: DC | PRN

## 2017-08-02 MED ORDER — ACETAMINOPHEN 325 MG PO TABS
650.0000 mg | ORAL_TABLET | ORAL | Status: DC
  Administered 2017-08-02 (×2): 650 mg via ORAL
  Filled 2017-08-02 (×2): qty 2

## 2017-08-02 MED ORDER — SENNOSIDES-DOCUSATE SODIUM 8.6-50 MG PO TABS: 2.0000 | ORAL_TABLET | ORAL | Status: DC

## 2017-08-02 MED ORDER — TETANUS-DIPHTH-ACELL PERTUSSIS 5-2.5-18.5 LF-MCG/0.5 IM SUSP
0.5000 mL | Freq: Once | INTRAMUSCULAR | Status: DC
  Filled 2017-08-02: qty 0.5

## 2017-08-02 MED ORDER — OXYCODONE HCL 5 MG PO TABS: 5.0000 mg | ORAL_TABLET | ORAL | 0 refills | Status: DC | PRN

## 2017-08-02 MED ORDER — PRENATAL MULTIVITAMIN CH: 1.0000 | ORAL_TABLET | Freq: Every day | ORAL | Status: DC

## 2017-08-02 MED ORDER — OXYCODONE HCL 5 MG PO TABS
5.0000 mg | ORAL_TABLET | ORAL | Status: DC | PRN
Start: 2017-08-02 — End: 2017-08-02
  Administered 2017-08-02 (×2): 5 mg via ORAL
  Filled 2017-08-02 (×2): qty 1

## 2017-08-02 MED ORDER — COCONUT OIL OIL
1.0000 "application " | TOPICAL_OIL | Status: DC | PRN
  Filled 2017-08-02: qty 120

## 2017-08-02 MED ORDER — MISOPROSTOL 200 MCG PO TABS
ORAL_TABLET | ORAL | Status: AC
  Filled 2017-08-02: qty 4

## 2017-08-02 MED ORDER — DIPHENHYDRAMINE HCL 25 MG PO CAPS: 25.0000 mg | ORAL_CAPSULE | Freq: Four times a day (QID) | ORAL | Status: DC | PRN

## 2017-08-02 MED ORDER — METHYLERGONOVINE MALEATE 0.2 MG PO TABS: 0.2000 mg | ORAL_TABLET | Freq: Four times a day (QID) | ORAL | 0 refills | Status: AC

## 2017-08-02 MED ORDER — MISOPROSTOL 200 MCG PO TABS
200.0000 ug | ORAL_TABLET | ORAL | Status: DC
  Administered 2017-08-02: 200 ug via VAGINAL
  Filled 2017-08-02: qty 1

## 2017-08-02 MED ORDER — ONDANSETRON HCL 4 MG/2ML IJ SOLN: 4.0000 mg | INTRAMUSCULAR | Status: DC | PRN

## 2017-08-02 MED ORDER — BENZOCAINE-MENTHOL 20-0.5 % EX AERO
1.0000 "application " | INHALATION_SPRAY | CUTANEOUS | Status: DC | PRN
  Filled 2017-08-02: qty 56

## 2017-08-02 MED ORDER — METHYLERGONOVINE MALEATE 0.2 MG/ML IJ SOLN
0.2000 mg | Freq: Once | INTRAMUSCULAR | Status: AC
  Administered 2017-08-02: 0.2 mg via INTRAMUSCULAR

## 2017-08-02 MED ORDER — MISOPROSTOL 200 MCG PO TABS
800.0000 ug | ORAL_TABLET | Freq: Once | ORAL | Status: AC
  Administered 2017-08-02: 800 ug via RECTAL

## 2017-08-02 MED ORDER — SIMETHICONE 80 MG PO CHEW: 80.0000 mg | CHEWABLE_TABLET | ORAL | Status: DC | PRN

## 2017-08-02 MED ORDER — ACETAMINOPHEN 325 MG PO TABS: 650.0000 mg | ORAL_TABLET | ORAL | Status: DC | PRN

## 2017-08-02 NOTE — Progress Notes (Signed)
Epidural catheter pulled. Blue tip intact.

## 2017-08-02 NOTE — Progress Notes (Signed)
Subjective: Postpartum Day 0: Vaginal delivery of IUFD, with known heart block, ascites, enlarged atrium, pericardial effusion, no laceration Patient up ad lib, reports no syncope or dizziness.  Several family members accompanying, patient with great support.  Coping well. Contraceptive plan:  Undecided  Objective: Vital signs in last 24 hours: Temp:  [98.2 F (36.8 C)-99.2 F (37.3 C)] 98.9 F (37.2 C) (01/26 0935) Pulse Rate:  [97-132] 104 (01/26 0937) Resp:  [16-20] 16 (01/26 0935) BP: (82-121)/(30-68) 118/54 (01/26 16100937)   Vitals:   08/02/17 0814 08/02/17 0925 08/02/17 0935 08/02/17 0937  BP:    (!) 118/54  Pulse:    (!) 104  Resp: 16 16 16    Temp:   98.9 F (37.2 C)   TempSrc:   Oral   Weight:      Height:       Received Cytotech 800 mcg after delivery, and Methergine 0.2 mg IM given 0732 for mild bogginess on exam.  Physical Exam:  General: alert Lochia: appropriate Uterine Fundus: firm, no clots expressed. Perineum: Intact DVT Evaluation: No evidence of DVT seen on physical exam. Negative Homan's sign.   CBC Latest Ref Rng & Units 08/02/2017 08/01/2017 04/28/2013  WBC 4.0 - 10.5 K/uL 6.9 8.8 8.6  Hemoglobin 12.0 - 15.0 g/dL 9.6(E9.8(L) 10.2(L) 7.3(L)  Hematocrit 36.0 - 46.0 % 28.1(L) 29.7(L) 21.1(L)  Platelets 150 - 400 K/uL 290 344 206     Assessment/Plan: Status post vaginal delivery day 0--S/p IUFD at 22 2/7 weeks, possible Sjogrens sx. Stable Continue current care. Will assess around 2pm for possible d/c later today, per patient request. Plan Rx Methergine 0.2 mg po q 6 x 24 hours at discharge. [facility].  Nigel BridgemanVicki Dekayla Prestridge CNM 08/02/2017, 9:52 AM

## 2017-08-02 NOTE — Discharge Summary (Signed)
OB Discharge Summary     Patient Name: Pamela Erickson DOB: 1990-08-30 MRN: [id number]  Date of admission: 08/01/2017 Delivering MD: Kenney HousemanPROTHERO, NANCY JEAN   Date of discharge: 08/02/2017  Admitting diagnosis: INDUCTION Intrauterine pregnancy: 3636w2d     Secondary diagnosis:  Principal Problem:   Vaginal delivery Active Problems:   IUFD at 20 weeks or more of gestation   Fetal demise before 22 weeks with retention of dead fetus  Additional problems: None     Discharge diagnosis: IUFD, induction                                                                                                Post partum procedures:NA  Augmentation: AROM, Pitocin and Cytotec  Complications: None  Hospital course:  Induction of Labor With Vaginal Delivery   27 y.o. yo G2P1001 at 3536w2d was admitted to the hospital 08/01/2017 for induction of labor.  Indication for induction: IUFD. Patient had an uncomplicated labor course as follows: Membrane Rupture Time/Date: 5:46 AM ,08/02/2017   Intrapartum Procedures: Episiotomy: None [1]                                         Lacerations:  None [1]  Patient had delivery of a Non Viable infant.  Information for the patient's newborn:  Harriet Massonbraham, PendingBaby FD [[id number]][[id number]]  Delivery Method: VBAC, Spontaneous(Filed from Delivery Summary) "Joy"  She received pitocin, Cytotech, and Methergine after delivery, for mild uterine atony.  Baby had been dx with 3rd degree heart block at the anatomy US at 19 weeks, as well as ascites, pericardial effusions, and enlarged atrium.  Had been followed by MFM, with repeat US on 07/31/17 showing IUFD.  Patient now known to have positive SSA and SSB antibodies, with likely dx of Sjogren's sx.  She will be followed after pp phase by rheumatology.  Patient was consented for induction, and was admitted 08/01/17.  She delivered early this am, and was monitored during the day, without issues.  She desired to go home this afternoon.  She was  sent home with Rx for Methergine 0.2 mg po q 6 hours x 4 doses, and Oxycodone 5 mg po q4 hours prn, #12, no refills--she is unable to take Ibuprophen.  08/02/2017  Details of delivery can be found in separate delivery note.  Patient had a routine postpartum course. Patient is discharged home 08/02/17.  Physical exam  Vitals:   08/02/17 0937 08/02/17 1113 08/02/17 1333 08/02/17 1359  BP: (!) 118/54 (!) 103/57 (!) 105/53   Pulse: (!) 104 94 (!) 107   Resp:   16   Temp:    98.7 F (37.1 C)  TempSrc:    Oral  Weight:      Height:       General: alert Lochia: appropriate Uterine Fundus: firm Incision: NA DVT Evaluation: No evidence of DVT seen on physical exam. Labs: CBC Latest Ref Rng & Units 08/02/2017 08/01/2017 04/28/2013  WBC 4.0 - 10.5 K/uL 6.9 8.8 8.6  Hemoglobin 12.0 - 15.0 g/dL 1.6(X) 10.2(L) 7.3(L)  Hematocrit 36.0 - 46.0 % 28.1(L) 29.7(L) 21.1(L)  Platelets 150 - 400 K/uL 290 344 206   CMP Latest Ref Rng & Units 07/26/2012  Glucose 70 - 99 mg/dL 82  BUN 6 - 23 mg/dL 11  Creatinine 0.96 - 0.45 mg/dL 4.09  Sodium 811 - 914 mEq/L 140  Potassium 3.5 - 5.1 mEq/L 3.8  Chloride 96 - 112 mEq/L 103    Discharge instruction: per After Visit Summary and "Baby and Me Booklet".  After visit meds:  Allergies as of 08/02/2017      Reactions   Ibuprofen Rash   On hands      Medication List    STOP taking these medications   valACYclovir 500 MG tablet Commonly known as:  VALTREX     TAKE these medications   methylergonovine 0.2 MG tablet Commonly known as:  METHERGINE Take 1 tablet (0.2 mg total) by mouth every 6 (six) hours for 4 doses.   oxyCODONE 5 MG immediate release tablet Commonly known as:  Oxy IR/ROXICODONE Take 1 tablet (5 mg total) by mouth every 4 (four) hours as needed for moderate pain.   prenatal multivitamin Tabs tablet Take 1 tablet by mouth every morning.       Diet: routine diet  Activity: Advance as tolerated. Pelvic rest for 6 weeks.    Outpatient follow up:1 week Follow up Appt:No future appointments. Follow up Visit:No Follow-up on file.  Postpartum contraception: Will decide later.  Newborn Data: Live born female, "Joy" Birth Weight: 1 lb 5.2 oz (600 g) APGAR 0/0: ,   Newborn Delivery   Birth date/time:  08/02/2017 05:48:00 Delivery type:  VBAC, Spontaneous      Disposition:morgue   08/02/2017 Nigel Bridgeman, CNM

## 2017-08-02 NOTE — Anesthesia Preprocedure Evaluation (Signed)
Anesthesia Evaluation  Patient identified by MRN, date of birth, ID band Patient awake    Reviewed: Allergy & Precautions, H&P , Patient's Chart, lab work & pertinent test results  Airway Mallampati: II  TM Distance: >3 FB Neck ROM: full    Dental no notable dental hx.    Pulmonary    Pulmonary exam normal breath sounds clear to auscultation       Cardiovascular Exercise Tolerance: Good  Rhythm:regular Rate:Normal     Neuro/Psych    GI/Hepatic   Endo/Other    Renal/GU      Musculoskeletal   Abdominal   Peds  Hematology  (+) anemia ,   Anesthesia Other Findings   Reproductive/Obstetrics                             Anesthesia Physical Anesthesia Plan  ASA: II  Anesthesia Plan: Epidural   Post-op Pain Management:    Induction:   PONV Risk Score and Plan:   Airway Management Planned:   Additional Equipment:   Intra-op Plan:   Post-operative Plan:   Informed Consent: I have reviewed the patients History and Physical, chart, labs and discussed the procedure including the risks, benefits and alternatives for the proposed anesthesia with the patient or authorized representative who has indicated his/her understanding and acceptance.   Dental Advisory Given  Plan Discussed with:   Anesthesia Plan Comments: (Labs checked- platelets confirmed with RN in room. Fetal heart tracing, per RN, reported to be stable enough for sitting procedure. Discussed epidural, and patient consents to the procedure:  included risk of possible headache,backache, failed block, allergic reaction, and nerve injury. This patient was asked if she had any questions or concerns before the procedure started.)        Anesthesia Quick Evaluation  

## 2017-08-02 NOTE — Discharge Instructions (Signed)
Call with any severe pain, heavy bleeding, fever, or any other issues or concerns. Take tablets every 6 hours x 24 hours to help with bleeding. Use Tylenol for pain as needed, and oxycodone for any pain not taking care of by the Tylenol.

## 2017-08-02 NOTE — Anesthesia Postprocedure Evaluation (Signed)
Anesthesia Post Note  Patient: Steva Readyboni U Rease  Procedure(s) Performed: AN AD HOC LABOR EPIDURAL     Patient location during evaluation: Mother Baby Anesthesia Type: Epidural Level of consciousness: awake Pain management: pain level controlled Vital Signs Assessment: post-procedure vital signs reviewed and stable Respiratory status: spontaneous breathing Cardiovascular status: stable Postop Assessment: no headache, no backache, epidural receding, patient able to bend at knees, no apparent nausea or vomiting and adequate PO intake Anesthetic complications: no    Last Vitals:  Vitals:   08/02/17 1359 08/02/17 1524  BP:  106/60  Pulse:  91  Resp:  16  Temp: 37.1 C     Last Pain:  Vitals:   08/02/17 1524  TempSrc:   PainSc: 3    Pain Goal: Patients Stated Pain Goal: 4 (08/01/17 0816)               Fanny DanceMULLINS,Mariaha Ellington

## 2017-09-16 DIAGNOSIS — M3500 Sicca syndrome, unspecified: Secondary | ICD-10-CM | POA: Diagnosis not present

## 2017-09-16 DIAGNOSIS — Z3A20 20 weeks gestation of pregnancy: Secondary | ICD-10-CM | POA: Diagnosis not present

## 2017-09-16 DIAGNOSIS — O364XX0 Maternal care for intrauterine death, not applicable or unspecified: Secondary | ICD-10-CM | POA: Diagnosis not present

## 2017-12-02 ENCOUNTER — Ambulatory Visit (HOSPITAL_COMMUNITY): Payer: MEDICAID

## 2017-12-02 ENCOUNTER — Encounter (HOSPITAL_COMMUNITY): Payer: Self-pay

## 2017-12-04 ENCOUNTER — Encounter (HOSPITAL_COMMUNITY): Payer: Self-pay

## 2017-12-04 ENCOUNTER — Ambulatory Visit (HOSPITAL_COMMUNITY)
Admission: RE | Admit: 2017-12-04 | Discharge: 2017-12-04 | Disposition: A | Discharge status: Home / Self Care | Payer: Medicaid Other | Source: Ambulatory Visit | Attending: Obstetrics and Gynecology | Admitting: Obstetrics and Gynecology

## 2017-12-04 DIAGNOSIS — Z315 Encounter for genetic counseling: Secondary | ICD-10-CM | POA: Insufficient documentation

## 2017-12-04 DIAGNOSIS — M3500 Sicca syndrome, unspecified: Secondary | ICD-10-CM | POA: Diagnosis present

## 2017-12-04 DIAGNOSIS — Z8759 Personal history of other complications of pregnancy, childbirth and the puerperium: Secondary | ICD-10-CM | POA: Insufficient documentation

## 2017-12-04 DIAGNOSIS — Z9889 Other specified postprocedural states: Secondary | ICD-10-CM | POA: Diagnosis not present

## 2017-12-04 DIAGNOSIS — Z886 Allergy status to analgesic agent status: Secondary | ICD-10-CM | POA: Insufficient documentation

## 2017-12-04 NOTE — ED Notes (Signed)
Pt in for preconception consult.  BP 110/66, pulse 87, weight 212.6 lb, Pt in with Dr. Ezzard Standing.

## 2017-12-04 NOTE — Consult Note (Signed)
Maternal Fetal Medicine Consultation  Requesting Provider(s): Haygood  Primary OB: [facility] Reason for consultation: Previous IUFD of a fetus with complete heart block; mother is positive for anti-SSA and anti-SSB  HPI: 26yo P1101 now 5 months S/P delivery of an IUFD at 22 weeks with complete heart block and fetal hydrops. The heart block was discovered at 20 weeks, and subsequent testing showed her to be anti-SSA and anti-SSB positive. She comes in today to discuss interventions in a planned subsequent pregnancy. She saw rheumatology at [facility] on 09/16/2017 Isabelle Course, PA-C) who has placed her on hydroxychloroquine  daily (although the patient states she is only taking 1  dose a day). She has always been asymptomatic with no evidence of SLE. Her lab workup at [facility] was negative for other autoantibodies but did once again show very high levels of anti-SSA and anti-SSB. She sees the rheumatologist again in 2 weeks.  OB History: OB History    Gravida  2   Para  2   Term  1   Preterm  1   AB  0   Living  1     SAB  0   TAB  0   Ectopic  0   Multiple  0   Live Births  1           PMH:  Past Medical History:  Diagnosis Date  . Abnormal Pap smear    last pap 01/2012  . Anemia    During pregnancy  . Herpes   . 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 Torn ACL (anterior cruciate ligament) 2008  . Vaginal Pap smear, abnormal    May 2018; biopsy was normal    PSH:  Past Surgical History:  Procedure Laterality Date  . ANTERIOR CRUCIATE LIGAMENT REPAIR  2008  . CESAREAN SECTION N/A 04/25/2013   Procedure: CESAREAN SECTION;  Surgeon: Michael Litter, MD;  Location: [facility]S;  Service: Obstetrics;  Laterality: N/A;   Meds: Hydroxychloroquine /day, Valtrex PRN Allergies: Ibuprofen FH: See EPIC section Soc: See EPIC section  Review of Systems: A 14-point ROS was negative  PE:  VS: BP    110/66       Pulse    87     Weight  212.6 lb GEN:  well-appearing female ABD: NT, no HSM or masses  A/P: Previous child with IUFD due to complete heart block and hydrops High maternal levels of anti-SSA and anti-SSB  The patient is already being treated with hydroxychloroquine, the one intervention that may decrease the risk of recurrent complete heart block in SSA/SSA positive mothers. I have let her know that it is ok to start attempting pregnancy after her visit with rheumatology, but to increase her dose back up to the intended /day dosage level. Once pregnancy is diagnosed we would like to see her in MFM between 16-18 weeks to begin frequent monitoring for heart block   Thank you for the opportunity to be a part of the care of Mathew U Mazer. Please contact our office if we can be of further assistance.   I spent approximately 30 minutes with this patient with over 50% of time spent in face-to-face counseling.

## 2017-12-16 DIAGNOSIS — R768 Other specified abnormal immunological findings in serum: Secondary | ICD-10-CM | POA: Insufficient documentation

## 2017-12-24 ENCOUNTER — Other Ambulatory Visit: Payer: Self-pay | Admitting: Obstetrics and Gynecology

## 2018-02-01 ENCOUNTER — Inpatient Hospital Stay (HOSPITAL_COMMUNITY)
Admission: AD | Admit: 2018-02-01 | Discharge: 2018-02-01 | Disposition: A | Discharge status: Home / Self Care | Payer: Medicaid Other | Source: Ambulatory Visit | Attending: Obstetrics and Gynecology | Admitting: Obstetrics and Gynecology

## 2018-02-01 ENCOUNTER — Other Ambulatory Visit: Payer: Self-pay

## 2018-02-01 ENCOUNTER — Inpatient Hospital Stay (HOSPITAL_COMMUNITY): Payer: Medicaid Other

## 2018-02-01 ENCOUNTER — Encounter (HOSPITAL_COMMUNITY): Payer: Self-pay

## 2018-02-01 DIAGNOSIS — N8311 Corpus luteum cyst of right ovary: Secondary | ICD-10-CM | POA: Diagnosis not present

## 2018-02-01 DIAGNOSIS — O200 Threatened abortion: Secondary | ICD-10-CM | POA: Diagnosis not present

## 2018-02-01 DIAGNOSIS — O26851 Spotting complicating pregnancy, first trimester: Secondary | ICD-10-CM | POA: Insufficient documentation

## 2018-02-01 DIAGNOSIS — Z862 Personal history of diseases of the blood and blood-forming organs and certain disorders involving the immune mechanism: Secondary | ICD-10-CM | POA: Insufficient documentation

## 2018-02-01 DIAGNOSIS — O209 Hemorrhage in early pregnancy, unspecified: Secondary | ICD-10-CM

## 2018-02-01 DIAGNOSIS — O208 Other hemorrhage in early pregnancy: Secondary | ICD-10-CM | POA: Diagnosis not present

## 2018-02-01 DIAGNOSIS — R109 Unspecified abdominal pain: Secondary | ICD-10-CM | POA: Insufficient documentation

## 2018-02-01 DIAGNOSIS — Z3A01 Less than 8 weeks gestation of pregnancy: Secondary | ICD-10-CM | POA: Insufficient documentation

## 2018-02-01 DIAGNOSIS — R188 Other ascites: Secondary | ICD-10-CM | POA: Insufficient documentation

## 2018-02-01 DIAGNOSIS — O26891 Other specified pregnancy related conditions, first trimester: Secondary | ICD-10-CM

## 2018-02-01 DIAGNOSIS — B379 Candidiasis, unspecified: Secondary | ICD-10-CM

## 2018-02-01 DIAGNOSIS — O3481 Maternal care for other abnormalities of pelvic organs, first trimester: Secondary | ICD-10-CM | POA: Insufficient documentation

## 2018-02-01 DIAGNOSIS — R110 Nausea: Secondary | ICD-10-CM | POA: Insufficient documentation

## 2018-02-01 LAB — URINALYSIS, ROUTINE W REFLEX MICROSCOPIC
BILIRUBIN URINE: NEGATIVE
Glucose, UA: NEGATIVE mg/dL
Ketones, ur: NEGATIVE mg/dL
NITRITE: NEGATIVE
PH: 7 (ref 5.0–8.0)
Protein, ur: NEGATIVE mg/dL
SPECIFIC GRAVITY, URINE: 1.014 (ref 1.005–1.030)

## 2018-02-01 LAB — POCT PREGNANCY, URINE: Preg Test, Ur: POSITIVE — AB

## 2018-02-01 LAB — WET PREP, GENITAL
Clue Cells Wet Prep HPF POC: NONE SEEN
SPERM: NONE SEEN
Trich, Wet Prep: NONE SEEN

## 2018-02-01 LAB — HCG, QUANTITATIVE, PREGNANCY: hCG, Beta Chain, Quant, S: 42588 m[IU]/mL — ABNORMAL HIGH (ref <5)

## 2018-02-01 NOTE — MAU Note (Addendum)
G2P1 early pregnant at approx [redacted] wksga. HPT +. Presents to triage dt "cramping and pink discharge'. Cramping started yesterday along with pink discharge. Did not take any pain meds for the cramping. Cramping is lower middle area of the abdomen  LMP: June 8   Hx IUFD @ 20 wksg with heart block. Pt has autoimmune disorder (Sjorgren syndrome)  HSV Anemia with first pregnancy on Fe Abnormal PAP - biopsy normal  Sx: Torn ACLS left leg repair   BP 125/70   Pulse 79   Temp 99.2 F (37.3 C)   Resp 18   Ht 5\' 2"  (1.575 m)   Wt 210 lb (95.3 kg)   BMI 38.41 kg/m   2037: [facility]. Report status of pt given.  2040: provider in the unit.   2054: Provider at bs assessing.   2119: Lab notified for stat labs. Stated will be in unit in 10 mins  2128: lab at bs.  2132: U/S paged  2135: U/S returned page. Informed wrong U/S ordered. Provider made aware. Corrected order per request.  2137: pt to U/S  2202: Back from U/S   2219: [facility]

## 2018-02-01 NOTE — MAU Provider Note (Addendum)
Chief Complaint: Abdominal Cramping and Vaginal Discharge ("pink")   SUBJECTIVE HPI: Pamela Erickson is a 27 y.o. G2P1101 at 6.[redacted] weeks gestation who presents to MAU, early pregnant . HPT +. Presents to triage dt "cramping and pink discharge'. Pt stated she saw pinkish and a little bit on the pad and when she wiped, but has since resolved. Pt tearful in room due to past fetal demise. Cramping started yesterday along with pink discharge. Did not take any pain meds for the cramping. Cramping is lower middle area of the abdomen. Occurred intermittently, problem has since resolved and currently no pain. Pt denies trauma, no sexual intercourse, denies v, rashes, dysuria, vaginal irritationLMP: June 8, has NOB appointment with [facility] at 02/18/2018. Pt endorses mild fatigue with mild nausea not tx and not need tx at this time.   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 Pt has autoimmune disorder (Sjorgren syndrome)  HSV Anemia with first pregnancy on Fe Abnormal PAP - biopsy normal  Sx: Torn ACLS left leg repair   Past Medical History:  Diagnosis Date  . Abnormal Pap smear    last pap 01/2012  . Anemia    During pregnancy  . Herpes   . 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 Torn ACL (anterior cruciate ligament) 2008  . Vaginal Pap smear, abnormal    May 2018; biopsy was normal   OB History  Gravida Para Term Preterm AB Living  2 2 1 1  0 1  SAB TAB Ectopic Multiple Live Births  0 0 0 0 1    # Outcome Date GA Lbr Len/2nd Weight Sex Delivery Anes PTL Lv  2 Preterm 07/23/17 2120w6d   F Vag-Spont   FD     Birth Comments: 3rd degree heart block, ascites, pericardial effusion  1 Term 04/25/13 6553w1d   F CS-LTranv EPI  LIV   Past Surgical History:  Procedure Laterality Date  . ANTERIOR CRUCIATE LIGAMENT REPAIR  2008  . CESAREAN SECTION N/A 04/25/2013   Procedure: CESAREAN SECTION;  Surgeon: Michael LitterNaima A Dillard, MD;  Location: [facility];  Service:  Obstetrics;  Laterality: N/A;   Social History   Socioeconomic History  . Marital status: Single    Spouse name: Not on file  . Number of children: Not on file  . Years of education: 15+  . Highest education level: Not on file  Occupational History  . Occupation: FirefighterLIFEGUARD    Employer: [employer]  . Occupation: STUDENT  Social Needs  . Financial resource strain: Not on file  . Food insecurity:    Worry: Not on file    Inability: Not on file  . Transportation needs:    Medical: Not on file    Non-medical: Not on file  Tobacco Use  . Smoking status: Never Smoker  . Smokeless tobacco: Never Used  Substance and Sexual Activity  . Alcohol use: No    Frequency: Never    Comment: OCC  . Drug use: Yes    Types: Marijuana    Comment: OCC LAST USE 06/2012  . Sexual activity: Yes    Partners: Male    Birth control/protection: None  Lifestyle  . Physical activity:    Days per week: Not on file    Minutes per session: Not on file  . Stress: Not on file  Relationships  . Social connections:    Talks on phone: Not on file  Gets together: Not on file    Attends religious service: Not on file    Active member of club or organization: Not on file    Attends meetings of clubs or organizations: Not on file    Relationship status: Not on file  . Intimate partner violence:    Fear of current or ex partner: Not on file    Emotionally abused: Not on file    Physically abused: Not on file    Forced sexual activity: Not on file  Other Topics Concern  . Not on file  Social History Narrative  . Not on file   No current facility-administered medications on file prior to encounter.    Current Outpatient Medications on File Prior to Encounter  Medication Sig Dispense Refill  . Hydroxychloroquine Sulfate (PLAQUENIL PO) Take by mouth.    . oxyCODONE (OXY IR/ROXICODONE) 5 MG immediate release tablet Take 1 tablet (5 mg total) by mouth every 4 (four) hours as needed for moderate pain.  (Patient not taking: Reported on 12/04/2017) 12 tablet 0  . Prenatal Vit-Fe Fumarate-FA (PRENATAL MULTIVITAMIN) TABS tablet Take 1 tablet by mouth every morning.    . valACYclovir HCl (VALTREX PO) Take by mouth.     Allergies  Allergen Reactions  . Ibuprofen Rash    On hands    I have reviewed the past Medical Hx, Surgical Hx, Social Hx, Allergies and Medications.   REVIEW OF SYSTEMS All systems reviewed and are negative for acute change except as noted in the HPI.   OBJECTIVE BP 121/70 (BP Location: Right Arm)   Pulse 77   Temp 99.2 F (37.3 C)   Resp 16   Ht 5\' 2"  (1.575 m)   Wt 95.3 kg (210 lb)   BMI 38.41 kg/m    PHYSICAL EXAM Constitutional: Well-developed, well-nourished female in no acute distress.  Cardiovascular: normal rate and rhythm, pulses intact Respiratory: normal rate and effort.  GI: Abd soft, non-tender, non-distended. Pos BS x 4 MS: Extremities nontender, no edema, normal ROM Neurologic: Alert and oriented x 4. No focal deficits GU: Neg CVAT. SPECULUM EXAM: terazol cram seen clumped on external genitalia. NEFG, physiologic discharge, no blood noted, cervix clean BIMANUAL: cervix closed and thick; uterus normal size for 7 weeks, no adnexal tenderness or masses. No CMT. Psych: normal mood and affect  LAB RESULTS Results for orders placed or performed during the hospital encounter of 02/01/18 (from the past 24 hour(s))  Pregnancy, urine POC     Status: Abnormal   Collection Time: 02/01/18  8:46 PM  Result Value Ref Range   Preg Test, Ur POSITIVE (A) NEGATIVE  Urinalysis, Routine w reflex microscopic     Status: Abnormal   Collection Time: 02/01/18  8:52 PM  Result Value Ref Range   Color, Urine YELLOW YELLOW   APPearance HAZY (A) CLEAR   Specific Gravity, Urine 1.014 1.005 - 1.030   pH 7.0 5.0 - 8.0   Glucose, UA NEGATIVE NEGATIVE mg/dL   Hgb urine dipstick SMALL (A) NEGATIVE   Bilirubin Urine NEGATIVE NEGATIVE   Ketones, ur NEGATIVE NEGATIVE  mg/dL   Protein, ur NEGATIVE NEGATIVE mg/dL   Nitrite NEGATIVE NEGATIVE   Leukocytes, UA LARGE (A) NEGATIVE   RBC / HPF 0-5 0 - 5 RBC/hpf   WBC, UA 6-10 0 - 5 WBC/hpf   Bacteria, UA RARE (A) NONE SEEN   Squamous Epithelial / LPF 0-5 0 - 5   Mucus PRESENT   Wet prep, genital  Status: Abnormal   Collection Time: 02/01/18  9:04 PM  Result Value Ref Range   Yeast Wet Prep HPF POC PRESENT (A) NONE SEEN   Trich, Wet Prep NONE SEEN NONE SEEN   Clue Cells Wet Prep HPF POC NONE SEEN NONE SEEN   WBC, Wet Prep HPF POC MANY (A) NONE SEEN   Sperm NONE SEEN   hCG, quantitative, pregnancy     Status: Abnormal   Collection Time: 02/01/18  9:28 PM  Result Value Ref Range   hCG, Beta Chain, Quant, S 42,588 (H) <5 mIU/mL    IMAGING US Ob Less Than 14 Weeks With Ob Transvaginal  Result Date: 02/01/2018 CLINICAL DATA:  Pregnant, abdominal pain EXAM: OBSTETRIC <14 WK [place] AND TRANSVAGINAL OB US TECHNIQUE: Both transabdominal and transvaginal ultrasound examinations were performed for complete evaluation of the gestation as well as the maternal uterus, adnexal regions, and pelvic cul-de-sac. Transvaginal technique was performed to assess early pregnancy. COMPARISON:  None. FINDINGS: Intrauterine gestational sac: Single Yolk sac:  Visualized. Embryo:  Visualized. Cardiac Activity: Visualized. Heart Rate: 133 bpm CRL:  8.1 mm   6 w   5 d                  [place] EDC: 09/22/2018 Subchorionic hemorrhage:  Small subchronic hemorrhage. Maternal uterus/adnexae: Bilateral ovaries are within normal limits, noting a right corpus luteal cyst. Small volume pelvic ascites. IMPRESSION: Single live intrauterine gestation, with estimated gestational age [redacted] weeks 5 days by crown-rump length, as above. Electronically Signed   By: Charline Bills M.D.   On: 02/01/2018 22:13    MAU Management/MDM: Vitals and nursing notes reviewed Orders Placed This Encounter  Procedures  . Wet prep, genital  . US OB LESS THAN 14 WEEKS WITH OB  TRANSVAGINAL  . Urinalysis, Routine w reflex microscopic  . hCG, quantitative, pregnancy  . Diet - low sodium heart healthy  . Increase activity slowly  . Call MD for:  . Call MD for:  temperature >100.4  . Call MD for:  persistant nausea and vomiting  . Call MD for:  severe uncontrolled pain  . Call MD for:  redness, tenderness, or signs of infection (pain, swelling, redness, odor or green/yellow discharge around incision site)  . Call MD for:  difficulty breathing, headache or visual disturbances  . Call MD for:  hives  . Call MD for:  persistant dizziness or light-headedness  . Call MD for:  extreme fatigue  . (HEART FAILURE PATIENTS) Call MD:  Anytime you have any of the following symptoms: 1) 3 pound weight gain in 24 hours or 5 pounds in 1 week 2) shortness of breath, with or without a dry hacking cough 3) swelling in the hands, feet or stomach 4) if you have to sleep on extra pillows at night in order to breathe.  . Pregnancy, urine POC  . Discharge patient Discharge disposition: 01-Home or Self Care; Discharge patient date: 02/01/2018    No orders of the defined types were placed in this encounter.   Plan of care reviewed with patient, including labs and tests ordered and medical treatment.  Treatments in MAU included positive UPT, see below for results, SIUP on [place] , discharged home .   ASSESSMENT Pamela Erickson is a 27 y.o. G2P1101 at 6.[redacted] weeks gestation, spotting with mild cramping, resolved now, pt stable, Beta-hcg 42,588, neg UA, positive UPT, positive yeast, no trich, no BV,  US IMPRESSION: Single live intrauterine gestation, with estimated gestational age  [redacted] weeks 5 days by crown-rump length. 1. Bleeding in early pregnancy   2. Abdominal pain in pregnancy, first trimester   3. Yeast infection     PLAN Discharge home in stable condition. Pending CG/C. Yeast infection: Continue terazol 7 cream. Clean and dry. Open to air at bedtime.  Pt discharged with strict bleeding  precautions. Counseled on return precautions Handout given  Follow-up Information    [facility] Follow up.   Specialty:  Obstetrics and Gynecology Why:  Aug 14 for NOB already scheduled Contact information: [address] [place] [address] [phone]          Allergies as of 02/01/2018      Reactions   Ibuprofen Rash   On hands      Medication List    STOP taking these medications   oxyCODONE 5 MG immediate release tablet Commonly known as:  Oxy IR/ROXICODONE     TAKE these medications   PLAQUENIL PO Take by mouth.   prenatal multivitamin Tabs tablet Take 1 tablet by mouth every morning.   VALTREX PO Take by mouth.     Duru Reiger  02/01/2018, 10:55 PM

## 2018-02-02 LAB — GC/CHLAMYDIA PROBE AMP (~~LOC~~) NOT AT ARMC
CHLAMYDIA, DNA PROBE: NEGATIVE
Neisseria Gonorrhea: NEGATIVE

## 2018-02-18 ENCOUNTER — Encounter (HOSPITAL_COMMUNITY): Payer: Self-pay

## 2018-02-18 DIAGNOSIS — Z3A09 9 weeks gestation of pregnancy: Secondary | ICD-10-CM | POA: Diagnosis not present

## 2018-02-18 DIAGNOSIS — N925 Other specified irregular menstruation: Secondary | ICD-10-CM | POA: Diagnosis not present

## 2018-02-18 DIAGNOSIS — Z113 Encounter for screening for infections with a predominantly sexual mode of transmission: Secondary | ICD-10-CM | POA: Diagnosis not present

## 2018-02-18 DIAGNOSIS — M3500 Sicca syndrome, unspecified: Secondary | ICD-10-CM | POA: Diagnosis not present

## 2018-02-18 DIAGNOSIS — O3680X9 Pregnancy with inconclusive fetal viability, other fetus: Secondary | ICD-10-CM | POA: Diagnosis not present

## 2018-02-25 ENCOUNTER — Encounter (HOSPITAL_COMMUNITY): Payer: Self-pay | Admitting: *Deleted

## 2018-02-27 ENCOUNTER — Encounter (HOSPITAL_COMMUNITY): Payer: Self-pay

## 2018-02-27 ENCOUNTER — Ambulatory Visit (HOSPITAL_BASED_OUTPATIENT_CLINIC_OR_DEPARTMENT_OTHER)
Admission: RE | Admit: 2018-02-27 | Discharge: 2018-02-27 | Disposition: A | Discharge status: Home / Self Care | Payer: Medicaid Other | Source: Ambulatory Visit | Attending: Obstetrics and Gynecology | Admitting: Obstetrics and Gynecology

## 2018-02-27 ENCOUNTER — Other Ambulatory Visit (HOSPITAL_COMMUNITY): Payer: Self-pay | Admitting: Obstetrics and Gynecology

## 2018-02-27 ENCOUNTER — Other Ambulatory Visit (HOSPITAL_COMMUNITY): Payer: Self-pay | Admitting: *Deleted

## 2018-02-27 ENCOUNTER — Ambulatory Visit (HOSPITAL_COMMUNITY)
Admission: RE | Admit: 2018-02-27 | Discharge: 2018-02-27 | Disposition: A | Discharge status: Home / Self Care | Payer: Medicaid Other | Source: Ambulatory Visit | Attending: Obstetrics and Gynecology | Admitting: Obstetrics and Gynecology

## 2018-02-27 ENCOUNTER — Other Ambulatory Visit (HOSPITAL_COMMUNITY): Payer: Self-pay

## 2018-02-27 DIAGNOSIS — E6609 Other obesity due to excess calories: Secondary | ICD-10-CM | POA: Diagnosis not present

## 2018-02-27 DIAGNOSIS — Z3A10 10 weeks gestation of pregnancy: Secondary | ICD-10-CM | POA: Insufficient documentation

## 2018-02-27 DIAGNOSIS — M3501 Sicca syndrome with keratoconjunctivitis: Secondary | ICD-10-CM | POA: Insufficient documentation

## 2018-02-27 DIAGNOSIS — Z79899 Other long term (current) drug therapy: Secondary | ICD-10-CM | POA: Diagnosis not present

## 2018-02-27 DIAGNOSIS — Z362 Encounter for other antenatal screening follow-up: Secondary | ICD-10-CM

## 2018-02-27 DIAGNOSIS — M3500 Sicca syndrome, unspecified: Secondary | ICD-10-CM | POA: Diagnosis not present

## 2018-02-27 DIAGNOSIS — O99011 Anemia complicating pregnancy, first trimester: Secondary | ICD-10-CM | POA: Insufficient documentation

## 2018-02-27 DIAGNOSIS — O09291 Supervision of pregnancy with other poor reproductive or obstetric history, first trimester: Secondary | ICD-10-CM

## 2018-02-27 DIAGNOSIS — O99211 Obesity complicating pregnancy, first trimester: Secondary | ICD-10-CM | POA: Insufficient documentation

## 2018-02-27 DIAGNOSIS — Z8249 Family history of ischemic heart disease and other diseases of the circulatory system: Secondary | ICD-10-CM | POA: Insufficient documentation

## 2018-02-27 DIAGNOSIS — O34219 Maternal care for unspecified type scar from previous cesarean delivery: Secondary | ICD-10-CM | POA: Insufficient documentation

## 2018-02-27 DIAGNOSIS — Z6836 Body mass index (BMI) 36.0-36.9, adult: Secondary | ICD-10-CM | POA: Diagnosis not present

## 2018-02-27 DIAGNOSIS — O9989 Other specified diseases and conditions complicating pregnancy, childbirth and the puerperium: Secondary | ICD-10-CM | POA: Diagnosis not present

## 2018-02-27 DIAGNOSIS — O3680X0 Pregnancy with inconclusive fetal viability, not applicable or unspecified: Secondary | ICD-10-CM

## 2018-02-27 DIAGNOSIS — D573 Sickle-cell trait: Secondary | ICD-10-CM | POA: Diagnosis not present

## 2018-02-27 DIAGNOSIS — E669 Obesity, unspecified: Secondary | ICD-10-CM | POA: Insufficient documentation

## 2018-02-27 HISTORY — DX: Sjogren syndrome, unspecified: M35.00

## 2018-02-27 HISTORY — DX: Sjogren syndrome with keratoconjunctivitis: M35.01

## 2018-02-27 NOTE — Consult Note (Signed)
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 SSA and SSB - positive.  Sjogren's Syndrome diagnosed in 2019 although based on dental history.  Review of records by OB revealed a positive SSA/SSB from 2015.  Patient is currently taking Plaquenil 400 mg QD since March 2019.    PREVIOUS OBSTETRICAL HISTORY:  1) 2014 - Primary C/S @ term due to fetal bradycardia, female, 7 lb 9 oz, without other complications 2) 2019 - 21-week IUFD secondary to complete heart block and fetal hydrops.       PREVIOUS GYN HISTORY:   Abnormal PAP - 2019 - biopsy   GC - neg   Chlamydia - neg    Syphilis - neg   CAT - 14 x q 28-30 x 7   Contraception - none    PREVIOUS MEDICAL HISTORY:  DM - neg   HTN - neg   Asthma - neg   Thyroid - neg   Rheumatic Fever - neg    Heart - neg   Lung - neg   Liver - neg   Kidney - neg   Epilepsy - neg    TB - neg   Herpes - 2012   UTI - neg   As above     PREVIOUS SURGICAL HISTORY:  None    MEDICATIONS:  Prenatal Vitamins, Valacyclovir 500 mg QD prophylaxis, Plaquenil 400 mg QD       ALLERGIES/REACTIONS:  Ibuprofen - skin lesions      HABITS:  Smoking - neg   Drinking - neg   Drugs - neg    PSYCHOSOCIAL:  Engaged; FOB is involved      Sexual Abuse - neg   Physical Abuse - neg   Verbal Abuse - neg      PROFESSION:  Child Care Teacher - Means of prevention of acquisition of CMV during pregnancy include washing hands after changing diapers, not sharing eating utensils and drinking glasses, avoiding children's saliva (especially very young children) including no kissing children on the mouth, universal precautions and for women not in a stable monogamous sexual relationship - the practice of safe sex.       FAMILY HISTORY:  DM - neg   HTN - M, F, GM x 2   Twins - neg   Stillborns  - neg    Birth Defects - neg   Mental Retardation - neg   Blood Dyscrasias - neg   Anesthesia Complications - neg    Genetic - neg          General counseling was then performed regarding fetal complete heart block.  Most patients with isolated complete fetal heart block have had Anti Ro antibodies Anti Ro (SSA antibodies).  Isolated fetal complete heart block has been considered to result from an independent congenital malformation of the conduction system, maternal rheumatic disorder or fetal myocarditis.  It has been postulated that Anti Ro antibodies of the IgG class cross the placenta and damage the fetal cardiac conduction system.  Evidence of deposition of the antibodies in atrial tissue suggests a direct action on cardiac tissue.  This may initiate an inflammatory process with a subsequent immunologic response resulting in heart block.  There is speculation about whether this immunologic process interferes with normal development of the conducting system or damages the conducting  system after it has been formed.  Dexamethasone 4 mg daily has been used to treat fetuses in utero with some success.  Close A-P surveillance and serial U/S for fetal growth are indicated. SJOGREN'S SYNDROME:  General Counseling was then performed regarding Sjogren's Syndrome.  Sjogren's Syndrome is a rare autoimmune disorder that primarily affects the lacrimal and salivary glands with intense lymphocytic infiltration of these glands causing keratoconjunctivitis sicca.  This syndrome may be associated with a varied assortment of autoantibodies including SSA, SSB ANA, APA Screen and RF.  As in Lupus, SSA (Anti-Ro) antibodies has been associated with congenital heart block.  Follow-up should be based on the presence of other antibodies.   IMPRESSIONS:  1) Sjogren's Syndrome with SSA and SSB antibodies 2) Previous pregnancy complicated by IUFD secondary to complete heart block and fetal hydrops 3) Obesity - BMI - 37.68  (not discussed)    RECOMMENDATIONS:  1) Weekly FH evaluation beginning @ 14 weeks 2) Serial U/S for fetal growth every 4 weeks 3) Weekly BPP beginning @ 32 weeks             40 minutes spent in face-to-face consultation with greater than 50% of the time spent in counseling.    Thank you for utilizing our ultrasound and consultative services.  If I may be of any further service, please do not hesitate to contact me.  Sincerely,   Patsi Searsobert L. Dondi Burandt, MD Maternal-Fetal Medicine   Copy of report sent to practitioner/clinic.

## 2018-03-04 DIAGNOSIS — Z113 Encounter for screening for infections with a predominantly sexual mode of transmission: Secondary | ICD-10-CM | POA: Diagnosis not present

## 2018-03-04 DIAGNOSIS — Z6837 Body mass index (BMI) 37.0-37.9, adult: Secondary | ICD-10-CM | POA: Diagnosis not present

## 2018-03-04 DIAGNOSIS — Z3482 Encounter for supervision of other normal pregnancy, second trimester: Secondary | ICD-10-CM | POA: Diagnosis not present

## 2018-03-04 DIAGNOSIS — Z3A11 11 weeks gestation of pregnancy: Secondary | ICD-10-CM | POA: Diagnosis not present

## 2018-03-04 DIAGNOSIS — Z3481 Encounter for supervision of other normal pregnancy, first trimester: Secondary | ICD-10-CM | POA: Diagnosis not present

## 2018-03-04 LAB — OB RESULTS CONSOLE ANTIBODY SCREEN: Antibody Screen: NEGATIVE

## 2018-03-04 LAB — OB RESULTS CONSOLE ABO/RH: RH Type: POSITIVE

## 2018-03-04 LAB — OB RESULTS CONSOLE HIV ANTIBODY (ROUTINE TESTING): HIV: NONREACTIVE

## 2018-03-04 LAB — OB RESULTS CONSOLE RUBELLA ANTIBODY, IGM: Rubella: IMMUNE

## 2018-03-04 LAB — OB RESULTS CONSOLE HEPATITIS B SURFACE ANTIGEN: HEP B S AG: NEGATIVE

## 2018-03-04 LAB — OB RESULTS CONSOLE GC/CHLAMYDIA
Chlamydia: NEGATIVE
Gonorrhea: NEGATIVE

## 2018-03-04 LAB — OB RESULTS CONSOLE RPR: RPR: NONREACTIVE

## 2018-03-17 DIAGNOSIS — Z3A10 10 weeks gestation of pregnancy: Secondary | ICD-10-CM | POA: Diagnosis not present

## 2018-03-17 DIAGNOSIS — R768 Other specified abnormal immunological findings in serum: Secondary | ICD-10-CM | POA: Diagnosis not present

## 2018-03-17 DIAGNOSIS — Z3A13 13 weeks gestation of pregnancy: Secondary | ICD-10-CM | POA: Diagnosis not present

## 2018-03-17 DIAGNOSIS — Z79899 Other long term (current) drug therapy: Secondary | ICD-10-CM | POA: Insufficient documentation

## 2018-03-17 DIAGNOSIS — M3509 Sicca syndrome with other organ involvement: Secondary | ICD-10-CM | POA: Diagnosis not present

## 2018-03-24 ENCOUNTER — Ambulatory Visit (HOSPITAL_COMMUNITY)
Admission: RE | Admit: 2018-03-24 | Discharge: 2018-03-24 | Disposition: A | Discharge status: Home / Self Care | Payer: Medicaid Other | Source: Ambulatory Visit | Attending: Obstetrics and Gynecology | Admitting: Obstetrics and Gynecology

## 2018-03-24 ENCOUNTER — Encounter (HOSPITAL_COMMUNITY): Payer: Self-pay

## 2018-03-24 ENCOUNTER — Other Ambulatory Visit (HOSPITAL_COMMUNITY): Payer: Self-pay | Admitting: *Deleted

## 2018-03-24 DIAGNOSIS — M3500 Sicca syndrome, unspecified: Secondary | ICD-10-CM | POA: Diagnosis not present

## 2018-03-24 DIAGNOSIS — D573 Sickle-cell trait: Secondary | ICD-10-CM | POA: Diagnosis not present

## 2018-03-24 DIAGNOSIS — O9989 Other specified diseases and conditions complicating pregnancy, childbirth and the puerperium: Secondary | ICD-10-CM | POA: Diagnosis not present

## 2018-03-24 DIAGNOSIS — Z3A14 14 weeks gestation of pregnancy: Secondary | ICD-10-CM | POA: Diagnosis not present

## 2018-03-24 DIAGNOSIS — O99212 Obesity complicating pregnancy, second trimester: Secondary | ICD-10-CM | POA: Diagnosis not present

## 2018-03-24 DIAGNOSIS — Z362 Encounter for other antenatal screening follow-up: Secondary | ICD-10-CM | POA: Diagnosis not present

## 2018-03-24 DIAGNOSIS — O09292 Supervision of pregnancy with other poor reproductive or obstetric history, second trimester: Secondary | ICD-10-CM | POA: Diagnosis not present

## 2018-03-24 DIAGNOSIS — E669 Obesity, unspecified: Secondary | ICD-10-CM | POA: Diagnosis not present

## 2018-03-24 DIAGNOSIS — O34219 Maternal care for unspecified type scar from previous cesarean delivery: Secondary | ICD-10-CM | POA: Insufficient documentation

## 2018-03-24 DIAGNOSIS — Z363 Encounter for antenatal screening for malformations: Secondary | ICD-10-CM

## 2018-03-24 DIAGNOSIS — O99012 Anemia complicating pregnancy, second trimester: Secondary | ICD-10-CM | POA: Insufficient documentation

## 2018-04-06 ENCOUNTER — Encounter (HOSPITAL_COMMUNITY): Payer: Self-pay

## 2018-04-06 DIAGNOSIS — Z3402 Encounter for supervision of normal first pregnancy, second trimester: Secondary | ICD-10-CM | POA: Diagnosis not present

## 2018-04-06 DIAGNOSIS — Z3A16 16 weeks gestation of pregnancy: Secondary | ICD-10-CM | POA: Diagnosis not present

## 2018-04-06 DIAGNOSIS — N898 Other specified noninflammatory disorders of vagina: Secondary | ICD-10-CM | POA: Diagnosis not present

## 2018-04-06 DIAGNOSIS — A609 Anogenital herpesviral infection, unspecified: Secondary | ICD-10-CM | POA: Diagnosis not present

## 2018-04-06 DIAGNOSIS — M3500 Sicca syndrome, unspecified: Secondary | ICD-10-CM | POA: Diagnosis not present

## 2018-04-07 ENCOUNTER — Ambulatory Visit (HOSPITAL_COMMUNITY)
Admission: RE | Admit: 2018-04-07 | Discharge: 2018-04-07 | Disposition: A | Discharge status: Home / Self Care | Payer: Medicaid Other | Source: Ambulatory Visit | Attending: Obstetrics and Gynecology | Admitting: Obstetrics and Gynecology

## 2018-04-07 ENCOUNTER — Encounter (HOSPITAL_COMMUNITY): Payer: Self-pay

## 2018-04-07 DIAGNOSIS — O09292 Supervision of pregnancy with other poor reproductive or obstetric history, second trimester: Secondary | ICD-10-CM | POA: Insufficient documentation

## 2018-04-07 DIAGNOSIS — Z3A16 16 weeks gestation of pregnancy: Secondary | ICD-10-CM | POA: Diagnosis not present

## 2018-04-07 DIAGNOSIS — Z363 Encounter for antenatal screening for malformations: Secondary | ICD-10-CM

## 2018-04-14 DIAGNOSIS — Z3482 Encounter for supervision of other normal pregnancy, second trimester: Secondary | ICD-10-CM | POA: Diagnosis not present

## 2018-04-14 DIAGNOSIS — Z3A17 17 weeks gestation of pregnancy: Secondary | ICD-10-CM | POA: Diagnosis not present

## 2018-04-14 DIAGNOSIS — O3680X9 Pregnancy with inconclusive fetal viability, other fetus: Secondary | ICD-10-CM | POA: Diagnosis not present

## 2018-04-14 DIAGNOSIS — M3500 Sicca syndrome, unspecified: Secondary | ICD-10-CM | POA: Diagnosis not present

## 2018-04-21 ENCOUNTER — Other Ambulatory Visit (HOSPITAL_COMMUNITY): Payer: Self-pay | Admitting: *Deleted

## 2018-04-21 ENCOUNTER — Ambulatory Visit (HOSPITAL_COMMUNITY)
Admission: RE | Admit: 2018-04-21 | Discharge: 2018-04-21 | Disposition: A | Discharge status: Home / Self Care | Payer: Medicaid Other | Source: Ambulatory Visit | Attending: Obstetrics and Gynecology | Admitting: Obstetrics and Gynecology

## 2018-04-21 ENCOUNTER — Encounter (HOSPITAL_COMMUNITY): Payer: Self-pay

## 2018-04-21 DIAGNOSIS — O09292 Supervision of pregnancy with other poor reproductive or obstetric history, second trimester: Secondary | ICD-10-CM | POA: Insufficient documentation

## 2018-04-21 DIAGNOSIS — O99212 Obesity complicating pregnancy, second trimester: Secondary | ICD-10-CM | POA: Insufficient documentation

## 2018-04-21 DIAGNOSIS — Z362 Encounter for other antenatal screening follow-up: Secondary | ICD-10-CM | POA: Insufficient documentation

## 2018-04-21 DIAGNOSIS — Z862 Personal history of diseases of the blood and blood-forming organs and certain disorders involving the immune mechanism: Secondary | ICD-10-CM | POA: Insufficient documentation

## 2018-04-21 DIAGNOSIS — Z363 Encounter for antenatal screening for malformations: Secondary | ICD-10-CM | POA: Diagnosis not present

## 2018-04-21 DIAGNOSIS — Z3A18 18 weeks gestation of pregnancy: Secondary | ICD-10-CM | POA: Diagnosis not present

## 2018-04-21 DIAGNOSIS — Z8679 Personal history of other diseases of the circulatory system: Secondary | ICD-10-CM

## 2018-04-28 DIAGNOSIS — O3680X9 Pregnancy with inconclusive fetal viability, other fetus: Secondary | ICD-10-CM | POA: Diagnosis not present

## 2018-04-28 DIAGNOSIS — Z3A19 19 weeks gestation of pregnancy: Secondary | ICD-10-CM | POA: Diagnosis not present

## 2018-05-05 ENCOUNTER — Encounter (HOSPITAL_COMMUNITY): Payer: Self-pay

## 2018-05-05 ENCOUNTER — Ambulatory Visit (HOSPITAL_COMMUNITY)
Admission: RE | Admit: 2018-05-05 | Discharge: 2018-05-05 | Disposition: A | Discharge status: Home / Self Care | Payer: Medicaid Other | Source: Ambulatory Visit | Attending: Obstetrics and Gynecology | Admitting: Obstetrics and Gynecology

## 2018-05-05 DIAGNOSIS — Z362 Encounter for other antenatal screening follow-up: Secondary | ICD-10-CM | POA: Insufficient documentation

## 2018-05-05 DIAGNOSIS — O99212 Obesity complicating pregnancy, second trimester: Secondary | ICD-10-CM

## 2018-05-05 DIAGNOSIS — O09292 Supervision of pregnancy with other poor reproductive or obstetric history, second trimester: Secondary | ICD-10-CM

## 2018-05-05 DIAGNOSIS — Z3A20 20 weeks gestation of pregnancy: Secondary | ICD-10-CM

## 2018-05-05 DIAGNOSIS — O09299 Supervision of pregnancy with other poor reproductive or obstetric history, unspecified trimester: Secondary | ICD-10-CM | POA: Diagnosis not present

## 2018-05-05 DIAGNOSIS — M3500 Sicca syndrome, unspecified: Secondary | ICD-10-CM | POA: Insufficient documentation

## 2018-05-07 DIAGNOSIS — Z3A20 20 weeks gestation of pregnancy: Secondary | ICD-10-CM | POA: Diagnosis not present

## 2018-05-07 DIAGNOSIS — R768 Other specified abnormal immunological findings in serum: Secondary | ICD-10-CM | POA: Diagnosis not present

## 2018-05-07 DIAGNOSIS — O359XX1 Maternal care for (suspected) fetal abnormality and damage, unspecified, fetus 1: Secondary | ICD-10-CM | POA: Diagnosis not present

## 2018-05-07 DIAGNOSIS — O358XX0 Maternal care for other (suspected) fetal abnormality and damage, not applicable or unspecified: Secondary | ICD-10-CM | POA: Diagnosis not present

## 2018-05-14 DIAGNOSIS — Z3A21 21 weeks gestation of pregnancy: Secondary | ICD-10-CM | POA: Diagnosis not present

## 2018-05-14 DIAGNOSIS — O99112 Other diseases of the blood and blood-forming organs and certain disorders involving the immune mechanism complicating pregnancy, second trimester: Secondary | ICD-10-CM | POA: Diagnosis not present

## 2018-05-14 DIAGNOSIS — Z3A20 20 weeks gestation of pregnancy: Secondary | ICD-10-CM | POA: Diagnosis not present

## 2018-05-14 DIAGNOSIS — R768 Other specified abnormal immunological findings in serum: Secondary | ICD-10-CM | POA: Diagnosis not present

## 2018-05-14 DIAGNOSIS — M3503 Sicca syndrome with myopathy: Secondary | ICD-10-CM | POA: Diagnosis not present

## 2018-05-18 DIAGNOSIS — Z331 Pregnant state, incidental: Secondary | ICD-10-CM | POA: Diagnosis not present

## 2018-05-18 DIAGNOSIS — R768 Other specified abnormal immunological findings in serum: Secondary | ICD-10-CM | POA: Diagnosis not present

## 2018-05-18 DIAGNOSIS — Z3A22 22 weeks gestation of pregnancy: Secondary | ICD-10-CM | POA: Diagnosis not present

## 2018-05-18 DIAGNOSIS — M3509 Sicca syndrome with other organ involvement: Secondary | ICD-10-CM | POA: Diagnosis not present

## 2018-05-19 ENCOUNTER — Ambulatory Visit (HOSPITAL_COMMUNITY): Payer: Medicaid Other

## 2018-05-21 ENCOUNTER — Encounter (HOSPITAL_COMMUNITY): Payer: Self-pay

## 2018-05-21 DIAGNOSIS — R768 Other specified abnormal immunological findings in serum: Secondary | ICD-10-CM | POA: Diagnosis not present

## 2018-05-21 DIAGNOSIS — Z3A22 22 weeks gestation of pregnancy: Secondary | ICD-10-CM | POA: Diagnosis not present

## 2018-05-21 DIAGNOSIS — Z3492 Encounter for supervision of normal pregnancy, unspecified, second trimester: Secondary | ICD-10-CM | POA: Diagnosis not present

## 2018-05-21 DIAGNOSIS — Z3A29 29 weeks gestation of pregnancy: Secondary | ICD-10-CM | POA: Insufficient documentation

## 2018-05-21 DIAGNOSIS — O358XX0 Maternal care for other (suspected) fetal abnormality and damage, not applicable or unspecified: Secondary | ICD-10-CM | POA: Diagnosis not present

## 2018-05-21 DIAGNOSIS — M3503 Sicca syndrome with myopathy: Secondary | ICD-10-CM | POA: Diagnosis not present

## 2018-05-21 DIAGNOSIS — M3500 Sicca syndrome, unspecified: Secondary | ICD-10-CM | POA: Diagnosis not present

## 2018-05-21 DIAGNOSIS — O99112 Other diseases of the blood and blood-forming organs and certain disorders involving the immune mechanism complicating pregnancy, second trimester: Secondary | ICD-10-CM | POA: Diagnosis not present

## 2018-05-26 DIAGNOSIS — B372 Candidiasis of skin and nail: Secondary | ICD-10-CM | POA: Diagnosis not present

## 2018-05-26 DIAGNOSIS — Z3402 Encounter for supervision of normal first pregnancy, second trimester: Secondary | ICD-10-CM | POA: Diagnosis not present

## 2018-05-26 DIAGNOSIS — L292 Pruritus vulvae: Secondary | ICD-10-CM | POA: Diagnosis not present

## 2018-05-26 DIAGNOSIS — Z3A23 23 weeks gestation of pregnancy: Secondary | ICD-10-CM | POA: Diagnosis not present

## 2018-05-26 DIAGNOSIS — O99019 Anemia complicating pregnancy, unspecified trimester: Secondary | ICD-10-CM | POA: Diagnosis not present

## 2018-05-28 ENCOUNTER — Encounter (HOSPITAL_COMMUNITY): Payer: Self-pay

## 2018-05-28 DIAGNOSIS — R768 Other specified abnormal immunological findings in serum: Secondary | ICD-10-CM | POA: Diagnosis not present

## 2018-05-28 DIAGNOSIS — O358XX0 Maternal care for other (suspected) fetal abnormality and damage, not applicable or unspecified: Secondary | ICD-10-CM | POA: Diagnosis not present

## 2018-05-28 DIAGNOSIS — M329 Systemic lupus erythematosus, unspecified: Secondary | ICD-10-CM | POA: Diagnosis not present

## 2018-05-28 DIAGNOSIS — Z3A23 23 weeks gestation of pregnancy: Secondary | ICD-10-CM | POA: Diagnosis not present

## 2018-06-02 ENCOUNTER — Encounter (HOSPITAL_COMMUNITY): Payer: Self-pay

## 2018-06-02 ENCOUNTER — Ambulatory Visit (HOSPITAL_COMMUNITY): Payer: Medicaid Other

## 2018-06-02 DIAGNOSIS — Z3A24 24 weeks gestation of pregnancy: Secondary | ICD-10-CM | POA: Diagnosis not present

## 2018-06-02 DIAGNOSIS — M329 Systemic lupus erythematosus, unspecified: Secondary | ICD-10-CM | POA: Diagnosis not present

## 2018-06-02 DIAGNOSIS — O9989 Other specified diseases and conditions complicating pregnancy, childbirth and the puerperium: Secondary | ICD-10-CM | POA: Diagnosis not present

## 2018-06-02 DIAGNOSIS — R768 Other specified abnormal immunological findings in serum: Secondary | ICD-10-CM | POA: Diagnosis not present

## 2018-06-06 ENCOUNTER — Encounter (HOSPITAL_COMMUNITY): Payer: Self-pay | Admitting: *Deleted

## 2018-06-06 ENCOUNTER — Inpatient Hospital Stay (HOSPITAL_COMMUNITY)
Admission: AD | Admit: 2018-06-06 | Discharge: 2018-06-06 | Disposition: A | Discharge status: Home / Self Care | Payer: Medicaid Other | Source: Ambulatory Visit | Attending: Obstetrics and Gynecology | Admitting: Obstetrics and Gynecology

## 2018-06-06 DIAGNOSIS — A5901 Trichomonal vulvovaginitis: Secondary | ICD-10-CM | POA: Insufficient documentation

## 2018-06-06 DIAGNOSIS — N898 Other specified noninflammatory disorders of vagina: Secondary | ICD-10-CM | POA: Diagnosis present

## 2018-06-06 DIAGNOSIS — Z833 Family history of diabetes mellitus: Secondary | ICD-10-CM | POA: Diagnosis not present

## 2018-06-06 DIAGNOSIS — Z3A25 25 weeks gestation of pregnancy: Secondary | ICD-10-CM | POA: Diagnosis not present

## 2018-06-06 DIAGNOSIS — Z886 Allergy status to analgesic agent status: Secondary | ICD-10-CM | POA: Insufficient documentation

## 2018-06-06 DIAGNOSIS — O34219 Maternal care for unspecified type scar from previous cesarean delivery: Secondary | ICD-10-CM | POA: Insufficient documentation

## 2018-06-06 DIAGNOSIS — O99012 Anemia complicating pregnancy, second trimester: Secondary | ICD-10-CM | POA: Diagnosis not present

## 2018-06-06 DIAGNOSIS — O23592 Infection of other part of genital tract in pregnancy, second trimester: Secondary | ICD-10-CM

## 2018-06-06 LAB — WET PREP, GENITAL
CLUE CELLS WET PREP: NONE SEEN
Sperm: NONE SEEN
Yeast Wet Prep HPF POC: NONE SEEN

## 2018-06-06 LAB — POCT FERN TEST: POCT FERN TEST: NEGATIVE

## 2018-06-06 MED ORDER — METRONIDAZOLE 500 MG PO TABS
2000.0000 mg | ORAL_TABLET | Freq: Once | ORAL | Status: AC
  Administered 2018-06-06: 2000 mg via ORAL
  Filled 2018-06-06: qty 4

## 2018-06-06 NOTE — MAU Note (Signed)
Urine sent to lab 

## 2018-06-06 NOTE — MAU Note (Signed)
Pt states she has been having some pink discharge on her panty liner that appears watery.  It has continued into today.  Denies any pain.  Reports good fetal movement.  Last intercourse was yesterday.

## 2018-06-06 NOTE — Discharge Instructions (Signed)

## 2018-06-06 NOTE — MAU Provider Note (Signed)
Chief Complaint:  Vaginal Discharge   First Provider Initiated Contact with Patient 06/06/18 1403     HPI: Pamela Erickson is a 27 y.o. G3P1101 at 5752w0d who presents to maternity admissions reporting vaginal discharge. Symptoms for the last 2 days. Reports watery pink discharge. No odor. Denies vaginal itching/irritation. Denies abdominal pain. Positive fetal movement. Last had intercourse yesterday.   Past Medical History:  Diagnosis Date  . Abnormal Pap smear    last pap 01/2012  . Anemia    During pregnancy  . Herpes   . 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 Sjogren's syndrome (HCC)   . Torn ACL (anterior cruciate ligament) 2008  . Vaginal Pap smear, abnormal    May 2018; biopsy was normal   OB History  Gravida Para Term Preterm AB Living  3 2 1 1  0 1  SAB TAB Ectopic Multiple Live Births  0 0 0 0 1    # Outcome Date GA Lbr Len/2nd Weight Sex Delivery Anes PTL Lv  3 Current           2 Preterm 07/23/17 8212w6d   F Vag-Spont   FD     Birth Comments: 3rd degree heart block, ascites, pericardial effusion  1 Term 04/25/13 4882w1d   F CS-LTranv EPI  LIV   Past Surgical History:  Procedure Laterality Date  . ANTERIOR CRUCIATE LIGAMENT REPAIR  2008  . CESAREAN SECTION N/A 04/25/2013   Procedure: CESAREAN SECTION;  Surgeon: Michael LitterNaima A Dillard, MD;  Location: [facility];  Service: Obstetrics;  Laterality: N/A;   Family History  Problem Relation Age of Onset  . Hypertension Mother   . Hypertension Father   . Diabetes Maternal Aunt   . Hypertension Maternal Grandmother   . Hypertension Maternal Grandfather    Social History   Tobacco Use  . Smoking status: Never Smoker  . Smokeless tobacco: Never Used  Substance Use Topics  . Alcohol use: No    Frequency: Never    Comment: OCC  . Drug use: Yes    Types: Marijuana    Comment: OCC LAST USE 06/2012   Allergies  Allergen Reactions  . Ibuprofen Rash    On hands   No medications prior to admission.    I have reviewed  patient's Past Medical Hx, Surgical Hx, Family Hx, Social Hx, medications and allergies.   ROS:  Review of Systems  Constitutional: Negative.   Gastrointestinal: Negative.   Genitourinary: Positive for vaginal discharge. Negative for dyspareunia, dysuria and vaginal bleeding.    Physical Exam   Patient Vitals for the past 24 hrs:  BP Temp Temp src Pulse Resp SpO2 Weight  06/06/18 1525 - - - - - - 96.6 kg  06/06/18 1455 (!) 109/55 - - (!) 102 - - -  06/06/18 1327 126/64 97.9 F (36.6 C) Oral (!) 105 16 99 % -    Constitutional: Well-developed, well-nourished female in no acute distress.  Cardiovascular: normal rate & rhythm, no murmur Respiratory: normal effort, lung sounds clear throughout GI: Abd soft, non-tender, gravid appropriate for gestational age. Pos BS x 4 MS: Extremities nontender, no edema, normal ROM Neurologic: Alert and oriented x 4.  GU:  SSE performed. No pooling of fluid. Small amount of thin yellow discharge. Friable cervix. Cervix visually closed.   NST:  Baseline: 155 bpm, Variability: Good {> 6 bpm), Accelerations: Reactive and Decelerations: Absent   Labs: Results for orders placed or performed during the hospital encounter  of 06/06/18 (from the past 24 hour(s))  Wet prep, genital     Status: Abnormal   Collection Time: 06/06/18  2:21 PM  Result Value Ref Range   Yeast Wet Prep HPF POC NONE SEEN NONE SEEN   Trich, Wet Prep PRESENT (A) NONE SEEN   Clue Cells Wet Prep HPF POC NONE SEEN NONE SEEN   WBC, Wet Prep HPF POC MANY (A) NONE SEEN   Sperm NONE SEEN   POCT fern test     Status: None   Collection Time: 06/06/18  3:15 PM  Result Value Ref Range   POCT Fern Test Negative = intact amniotic membranes     Imaging:  No results found.  MAU Course: Orders Placed This Encounter  Procedures  . Wet prep, genital  . POCT fern test  . Discharge patient   Meds ordered this encounter  Medications  . metroNIDAZOLE (FLAGYL) tablet 2,000 mg     MDM: SSE performed. No pooling. Friable cervix. GC/CT & wet prep collected Fern negative Wet prep positive for trichomonas Flagyl 2 gm PO given in MAU  Assessment: 1. Trichomonal vaginitis during pregnancy in second trimester   2. [redacted] weeks gestation of pregnancy     Plan: Discharge home in stable condition.  Preterm Labor precautions and fetal kick counts EPT info & RX given No intercourse x 2 weeks after treatment  Follow-up Information    [facility] Follow up.   Specialty:  Obstetrics and Gynecology Contact information: [address] [place] [address] [phone]          Allergies as of 06/06/2018      Reactions   Ibuprofen Rash   On hands      Medication List    TAKE these medications   ASPIRIN 81 PO Take by mouth.   PLAQUENIL PO Take by mouth.   prenatal multivitamin Tabs tablet Take 1 tablet by mouth every morning.   VALTREX PO Take by mouth.       Judeth Horn, NP 06/06/2018 8:17 PM

## 2018-06-08 LAB — GC/CHLAMYDIA PROBE AMP (~~LOC~~) NOT AT ARMC
CHLAMYDIA, DNA PROBE: NEGATIVE
Neisseria Gonorrhea: NEGATIVE

## 2018-06-11 ENCOUNTER — Encounter (HOSPITAL_COMMUNITY): Payer: Self-pay

## 2018-06-11 DIAGNOSIS — O358XX0 Maternal care for other (suspected) fetal abnormality and damage, not applicable or unspecified: Secondary | ICD-10-CM | POA: Diagnosis not present

## 2018-06-11 DIAGNOSIS — Z3A25 25 weeks gestation of pregnancy: Secondary | ICD-10-CM | POA: Diagnosis not present

## 2018-06-11 DIAGNOSIS — R768 Other specified abnormal immunological findings in serum: Secondary | ICD-10-CM | POA: Diagnosis not present

## 2018-06-22 DIAGNOSIS — Z3A27 27 weeks gestation of pregnancy: Secondary | ICD-10-CM | POA: Diagnosis not present

## 2018-06-22 DIAGNOSIS — Z3492 Encounter for supervision of normal pregnancy, unspecified, second trimester: Secondary | ICD-10-CM | POA: Diagnosis not present

## 2018-06-25 ENCOUNTER — Encounter (HOSPITAL_COMMUNITY): Payer: Self-pay | Admitting: Obstetrics and Gynecology

## 2018-06-25 DIAGNOSIS — R768 Other specified abnormal immunological findings in serum: Secondary | ICD-10-CM | POA: Diagnosis not present

## 2018-06-25 DIAGNOSIS — O9989 Other specified diseases and conditions complicating pregnancy, childbirth and the puerperium: Secondary | ICD-10-CM | POA: Diagnosis not present

## 2018-06-25 DIAGNOSIS — Z3A27 27 weeks gestation of pregnancy: Secondary | ICD-10-CM | POA: Diagnosis not present

## 2018-06-29 DIAGNOSIS — O99019 Anemia complicating pregnancy, unspecified trimester: Secondary | ICD-10-CM | POA: Insufficient documentation

## 2018-07-07 ENCOUNTER — Encounter (HOSPITAL_COMMUNITY): Payer: Self-pay

## 2018-07-07 DIAGNOSIS — O358XX1 Maternal care for other (suspected) fetal abnormality and damage, fetus 1: Secondary | ICD-10-CM | POA: Diagnosis not present

## 2018-07-07 DIAGNOSIS — Z3A29 29 weeks gestation of pregnancy: Secondary | ICD-10-CM | POA: Diagnosis not present

## 2018-07-07 DIAGNOSIS — R768 Other specified abnormal immunological findings in serum: Secondary | ICD-10-CM | POA: Diagnosis not present

## 2018-07-07 DIAGNOSIS — Z3493 Encounter for supervision of normal pregnancy, unspecified, third trimester: Secondary | ICD-10-CM | POA: Diagnosis not present

## 2018-07-07 DIAGNOSIS — O358XX0 Maternal care for other (suspected) fetal abnormality and damage, not applicable or unspecified: Secondary | ICD-10-CM | POA: Diagnosis not present

## 2018-07-08 NOTE — L&D Delivery Note (Signed)
Delivery Note   09/16/2018  Date of delivery: 09/17/18 @ 0309  Pamela Erickson, 28 y.o., @ [redacted]w[redacted]d,  [phone], who was admitted for IOL due to maternal history of IUFD, TLOAC, LGA.. I was called to the room when she progressed +2 station in the second stage of labor.  She pushed for 1hour.  Urine foley cath was removed 10 mins prior to delivery of infant head. She delivered a viable infant, cephalic and restituted to the ROT position over an intact perineum.  A nuchal cord   was not identified. The baby was placed on maternal abdomen while initial step of NRP were perfmored (Dry, Stimulated, and warmed). Hat placed on baby for thermoregulation. Delayed cord clamping was performed for 2 minutes.  Cord double clamped and cut.  Cord cut by father. At this point there was brisk frank bleeding. Apgar scores were 8 and 9. Prophylactic Pitocin was started in the third stage of labor for active management. The placenta delivered spontaneously, shultz, with a 3 vessel cord and was sent to Pathology due to suspected chorio. Pt uterues was slightly boggy at this time, with brisk frank blood still occurring, uterine bimanual massage was performed, PPH was called cytotec was placed with IM methergine.  Inspection revealed 2nd degree and labial that still has briskl bleeding. An examination of the vaginal vault and cervix was free from lacerations. The uterus was firm, bleeding stable.  The repair was done under local lidocaine. Immediately bleeding was under control post repair, uterus was firm, midline and -2.  Umbilical artery blood gas were not sent.  There were no complications during the procedure.  Mom and baby skin to skin following delivery. Left in stable condition. Pt denies dizziness, SOB, no syncope, VS stable.   BP (!) 100/49   Pulse (!) 111   Temp 99.2 F (37.3 C) (Oral)   Resp 18   Ht 5' 2.5" (1.588 m)   Wt 104.3 kg   LMP 12/13/2017   Breastfeeding Unknown   BMI 41.40 kg/m   Maternal  Info: Anesthesia:Epidural Episiotomy: no Lacerations:  2nd laceration with right labial  Suture Repair: 3.0 vicryl CTH, 3.0 vicryl SH Est. Blood Loss (mL):  1109  Newborn Info:  Baby Sex: female  APGAR (1 MIN): 8   APGAR (5 MINS): 9   APGAR (10 MINS):     Mom to postpartum.  Baby to Couplet care / Skin to Skin.  DR Charlotta Newton aware of birth and PPH.    [place], [place], NP-C 09/17/18 6:24 AM

## 2018-07-27 DIAGNOSIS — Z8759 Personal history of other complications of pregnancy, childbirth and the puerperium: Secondary | ICD-10-CM | POA: Diagnosis not present

## 2018-07-27 DIAGNOSIS — Z3A32 32 weeks gestation of pregnancy: Secondary | ICD-10-CM | POA: Diagnosis not present

## 2018-07-27 DIAGNOSIS — Z3483 Encounter for supervision of other normal pregnancy, third trimester: Secondary | ICD-10-CM | POA: Diagnosis not present

## 2018-08-12 DIAGNOSIS — M3500 Sicca syndrome, unspecified: Secondary | ICD-10-CM | POA: Diagnosis not present

## 2018-08-12 DIAGNOSIS — Z3A34 34 weeks gestation of pregnancy: Secondary | ICD-10-CM | POA: Diagnosis not present

## 2018-08-12 DIAGNOSIS — N898 Other specified noninflammatory disorders of vagina: Secondary | ICD-10-CM | POA: Diagnosis not present

## 2018-08-12 DIAGNOSIS — D573 Sickle-cell trait: Secondary | ICD-10-CM | POA: Diagnosis not present

## 2018-08-12 DIAGNOSIS — Z8759 Personal history of other complications of pregnancy, childbirth and the puerperium: Secondary | ICD-10-CM | POA: Diagnosis not present

## 2018-08-18 DIAGNOSIS — Z3A35 35 weeks gestation of pregnancy: Secondary | ICD-10-CM | POA: Diagnosis not present

## 2018-08-18 DIAGNOSIS — M3509 Sicca syndrome with other organ involvement: Secondary | ICD-10-CM | POA: Diagnosis not present

## 2018-08-18 DIAGNOSIS — R768 Other specified abnormal immunological findings in serum: Secondary | ICD-10-CM | POA: Diagnosis not present

## 2018-08-27 DIAGNOSIS — O99213 Obesity complicating pregnancy, third trimester: Secondary | ICD-10-CM | POA: Diagnosis not present

## 2018-08-27 DIAGNOSIS — Z113 Encounter for screening for infections with a predominantly sexual mode of transmission: Secondary | ICD-10-CM | POA: Diagnosis not present

## 2018-08-27 DIAGNOSIS — Z3A36 36 weeks gestation of pregnancy: Secondary | ICD-10-CM | POA: Diagnosis not present

## 2018-08-27 DIAGNOSIS — Z3493 Encounter for supervision of normal pregnancy, unspecified, third trimester: Secondary | ICD-10-CM | POA: Diagnosis not present

## 2018-08-27 LAB — OB RESULTS CONSOLE GBS: GBS: NEGATIVE

## 2018-09-03 DIAGNOSIS — O99213 Obesity complicating pregnancy, third trimester: Secondary | ICD-10-CM | POA: Diagnosis not present

## 2018-09-03 DIAGNOSIS — Z3493 Encounter for supervision of normal pregnancy, unspecified, third trimester: Secondary | ICD-10-CM | POA: Diagnosis not present

## 2018-09-03 DIAGNOSIS — Z3A37 37 weeks gestation of pregnancy: Secondary | ICD-10-CM | POA: Diagnosis not present

## 2018-09-09 DIAGNOSIS — O99019 Anemia complicating pregnancy, unspecified trimester: Secondary | ICD-10-CM | POA: Diagnosis not present

## 2018-09-09 DIAGNOSIS — O99213 Obesity complicating pregnancy, third trimester: Secondary | ICD-10-CM | POA: Diagnosis not present

## 2018-09-09 DIAGNOSIS — Z8759 Personal history of other complications of pregnancy, childbirth and the puerperium: Secondary | ICD-10-CM | POA: Diagnosis not present

## 2018-09-09 DIAGNOSIS — B009 Herpesviral infection, unspecified: Secondary | ICD-10-CM | POA: Diagnosis not present

## 2018-09-09 DIAGNOSIS — Z3403 Encounter for supervision of normal first pregnancy, third trimester: Secondary | ICD-10-CM | POA: Diagnosis not present

## 2018-09-09 DIAGNOSIS — Z3A38 38 weeks gestation of pregnancy: Secondary | ICD-10-CM | POA: Diagnosis not present

## 2018-09-09 DIAGNOSIS — D573 Sickle-cell trait: Secondary | ICD-10-CM | POA: Diagnosis not present

## 2018-09-10 ENCOUNTER — Other Ambulatory Visit: Payer: Self-pay | Admitting: Obstetrics and Gynecology

## 2018-09-14 ENCOUNTER — Encounter (HOSPITAL_COMMUNITY): Payer: Self-pay | Admitting: *Deleted

## 2018-09-14 ENCOUNTER — Telehealth (HOSPITAL_COMMUNITY): Payer: Self-pay | Admitting: *Deleted

## 2018-09-14 NOTE — Telephone Encounter (Signed)
Preadmission screen  

## 2018-09-15 ENCOUNTER — Other Ambulatory Visit (HOSPITAL_COMMUNITY): Payer: Self-pay | Admitting: *Deleted

## 2018-09-16 ENCOUNTER — Other Ambulatory Visit: Payer: Self-pay

## 2018-09-16 ENCOUNTER — Inpatient Hospital Stay (HOSPITAL_COMMUNITY)
Admission: AD | Admit: 2018-09-16 | Discharge: 2018-09-19 | DRG: 805 | Disposition: A | Discharge status: Home / Self Care | Payer: Medicaid Other | Attending: Obstetrics and Gynecology | Admitting: Obstetrics and Gynecology

## 2018-09-16 ENCOUNTER — Inpatient Hospital Stay (HOSPITAL_COMMUNITY): Payer: Medicaid Other

## 2018-09-16 ENCOUNTER — Encounter (HOSPITAL_COMMUNITY): Payer: Self-pay

## 2018-09-16 ENCOUNTER — Inpatient Hospital Stay (HOSPITAL_COMMUNITY): Payer: Medicaid Other | Admitting: Anesthesiology

## 2018-09-16 ENCOUNTER — Encounter (HOSPITAL_COMMUNITY)
Admission: AD | Disposition: A | Discharge status: Home / Self Care | Payer: Self-pay | Source: Home / Self Care | Attending: Obstetrics and Gynecology

## 2018-09-16 DIAGNOSIS — D62 Acute posthemorrhagic anemia: Secondary | ICD-10-CM | POA: Diagnosis not present

## 2018-09-16 DIAGNOSIS — F1290 Cannabis use, unspecified, uncomplicated: Secondary | ICD-10-CM | POA: Diagnosis present

## 2018-09-16 DIAGNOSIS — D573 Sickle-cell trait: Secondary | ICD-10-CM | POA: Diagnosis present

## 2018-09-16 DIAGNOSIS — O99214 Obesity complicating childbirth: Secondary | ICD-10-CM | POA: Diagnosis present

## 2018-09-16 DIAGNOSIS — O99324 Drug use complicating childbirth: Secondary | ICD-10-CM | POA: Diagnosis present

## 2018-09-16 DIAGNOSIS — O3663X0 Maternal care for excessive fetal growth, third trimester, not applicable or unspecified: Principal | ICD-10-CM | POA: Diagnosis present

## 2018-09-16 DIAGNOSIS — Z3A39 39 weeks gestation of pregnancy: Secondary | ICD-10-CM

## 2018-09-16 DIAGNOSIS — O34219 Maternal care for unspecified type scar from previous cesarean delivery: Secondary | ICD-10-CM | POA: Diagnosis present

## 2018-09-16 DIAGNOSIS — A6000 Herpesviral infection of urogenital system, unspecified: Secondary | ICD-10-CM | POA: Diagnosis present

## 2018-09-16 DIAGNOSIS — O9081 Anemia of the puerperium: Secondary | ICD-10-CM | POA: Diagnosis not present

## 2018-09-16 DIAGNOSIS — O411230 Chorioamnionitis, third trimester, not applicable or unspecified: Secondary | ICD-10-CM | POA: Diagnosis present

## 2018-09-16 DIAGNOSIS — O9832 Other infections with a predominantly sexual mode of transmission complicating childbirth: Secondary | ICD-10-CM | POA: Diagnosis present

## 2018-09-16 DIAGNOSIS — O09293 Supervision of pregnancy with other poor reproductive or obstetric history, third trimester: Secondary | ICD-10-CM

## 2018-09-16 DIAGNOSIS — A630 Anogenital (venereal) warts: Secondary | ICD-10-CM | POA: Diagnosis present

## 2018-09-16 LAB — CBC
HCT: 29.4 % — ABNORMAL LOW (ref 36.0–46.0)
Hemoglobin: 9.1 g/dL — ABNORMAL LOW (ref 12.0–15.0)
MCH: 22.4 pg — ABNORMAL LOW (ref 26.0–34.0)
MCHC: 31 g/dL (ref 30.0–36.0)
MCV: 72.2 fL — ABNORMAL LOW (ref 80.0–100.0)
PLATELETS: 315 10*3/uL (ref 150–400)
RBC: 4.07 MIL/uL (ref 3.87–5.11)
RDW: 17.2 % — ABNORMAL HIGH (ref 11.5–15.5)
WBC: 8.5 10*3/uL (ref 4.0–10.5)
nRBC: 0 % (ref 0.0–0.2)

## 2018-09-16 LAB — TYPE AND SCREEN
ABO/RH(D): A POS
Antibody Screen: NEGATIVE

## 2018-09-16 LAB — RPR: RPR Ser Ql: NONREACTIVE

## 2018-09-16 LAB — ABO/RH: ABO/RH(D): A POS

## 2018-09-16 SURGERY — Surgical Case
Anesthesia: Epidural

## 2018-09-16 MED ORDER — LIDOCAINE HCL (PF) 1 % IJ SOLN
INTRAMUSCULAR | Status: DC | PRN
  Administered 2018-09-16: 11 mL via EPIDURAL

## 2018-09-16 MED ORDER — LACTATED RINGERS IV SOLN
INTRAVENOUS | Status: DC
  Administered 2018-09-16 (×2): via INTRAVENOUS

## 2018-09-16 MED ORDER — FENTANYL-BUPIVACAINE-NACL 0.5-0.125-0.9 MG/250ML-% EP SOLN
EPIDURAL | Status: AC
  Filled 2018-09-16: qty 250

## 2018-09-16 MED ORDER — ACETAMINOPHEN 325 MG PO TABS: 650.0000 mg | ORAL_TABLET | ORAL | Status: DC | PRN

## 2018-09-16 MED ORDER — LIDOCAINE HCL (PF) 1 % IJ SOLN
30.0000 mL | INTRAMUSCULAR | Status: DC | PRN
  Filled 2018-09-16: qty 30

## 2018-09-16 MED ORDER — OXYTOCIN 40 UNITS IN NORMAL SALINE INFUSION - SIMPLE MED
1.0000 m[IU]/min | INTRAVENOUS | Status: DC
  Administered 2018-09-16: 1 m[IU]/min via INTRAVENOUS
  Filled 2018-09-16: qty 1000

## 2018-09-16 MED ORDER — ONDANSETRON HCL 4 MG/2ML IJ SOLN: 4.0000 mg | Freq: Four times a day (QID) | INTRAMUSCULAR | Status: DC | PRN

## 2018-09-16 MED ORDER — OXYTOCIN BOLUS FROM INFUSION
500.0000 mL | Freq: Once | INTRAVENOUS | Status: AC
  Administered 2018-09-17: 500 mL via INTRAVENOUS

## 2018-09-16 MED ORDER — OXYTOCIN 40 UNITS IN NORMAL SALINE INFUSION - SIMPLE MED
2.5000 [IU]/h | INTRAVENOUS | Status: DC
  Administered 2018-09-17: 2.5 [IU]/h via INTRAVENOUS

## 2018-09-16 MED ORDER — SOD CITRATE-CITRIC ACID 500-334 MG/5ML PO SOLN: 30.0000 mL | ORAL | Status: DC | PRN

## 2018-09-16 MED ORDER — TERBUTALINE SULFATE 1 MG/ML IJ SOLN: 0.2500 mg | Freq: Once | INTRAMUSCULAR | Status: DC | PRN

## 2018-09-16 MED ORDER — FLEET ENEMA 7-19 GM/118ML RE ENEM: 1.0000 | ENEMA | RECTAL | Status: DC | PRN

## 2018-09-16 MED ORDER — SODIUM CHLORIDE (PF) 0.9 % IJ SOLN
INTRAMUSCULAR | Status: DC | PRN
  Administered 2018-09-16: 12 mL/h via EPIDURAL

## 2018-09-16 MED ORDER — FENTANYL CITRATE (PF) 100 MCG/2ML IJ SOLN: 50.0000 ug | INTRAMUSCULAR | Status: DC | PRN

## 2018-09-16 MED ORDER — LACTATED RINGERS IV SOLN: 500.0000 mL | INTRAVENOUS | Status: DC | PRN

## 2018-09-16 NOTE — Anesthesia Procedure Notes (Signed)
Epidural Patient location during procedure: OB Start time: 09/16/2018 6:45 PM End time: 09/16/2018 7:01 PM  Staffing Anesthesiologist: Lowella Curb, MD Performed: anesthesiologist   Preanesthetic Checklist Completed: patient identified, site marked, surgical consent, pre-op evaluation, timeout performed, IV checked, risks and benefits discussed and monitors and equipment checked  Epidural Patient position: sitting Prep: ChloraPrep Patient monitoring: heart rate, cardiac monitor, continuous pulse ox and blood pressure Approach: midline Location: L2-L3 Injection technique: LOR saline  Needle:  Needle type: Tuohy  Needle gauge: 17 G Needle length: 9 cm Needle insertion depth: 6 cm Catheter type: closed end flexible Catheter size: 20 Guage Catheter at skin depth: 11 cm Test dose: negative  Assessment Events: blood not aspirated, injection not painful, no injection resistance, negative IV test and no paresthesia  Additional Notes Reason for block:procedure for pain

## 2018-09-16 NOTE — Progress Notes (Addendum)
Labor Progress Note  Per H&P: Pamela Erickson is a 28 y.o. female, G3P1101, IUP at 39.4 weeks, presenting for IOL due to maternal history, TLOAC, LGA. Pt endorse + Fm. Denies vaginal leakage. Denies vaginal bleeding. Denies feeling cxt's. EFW 3988 gm, 92%, LGA. Baby Female. GBS-.   Pregnancy Problems  history of cesarean section (For FTP, desires TOL. VBAC CONSENT SIGNED 07/27/2018.)  history of intrauterine fetal death (20 week, 08-10-2017, from heart block, hydrops.Marland Kitchen MFM recommendations: Weekly limited scan for heart block from 16 weeks, detailed anatomy scan 18-20 weeks, serial [place] for growth q 4 weeks, close antenatal surveillance 3rd trimester. NORMAL FETAL ECHOS THROUGH 30 WEEKS.)  sickle cell trait (Monthly CM9)  fetal heart finding (Trivial/mild tricuspid regurgitation on echo, peds to f/u after delivery.)  anemia of pregnancy (06/22/18: Hgb 9.5, patient already on fusion plus. 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 Seen by r heumatologist 11/2017, on Plaquinil)  Subjective: Pt in NAD with partner at bedside, pt comfortable post epidural placement and verbally consented to IUPC and AROM.  Patient Active Problem List   Diagnosis Date Noted  . History of intrauterine fetal death, currently pregnant, third trimester 09/16/2018  . Sjogren's syndrome with keratoconjunctivitis sicca (HCC) 02/27/2018  . Morbid obesity (HCC) 02/27/2018  . History of fetal demise, not currently pregnant 12/04/2017  . Vaginal delivery 08/02/2017  . Sickle cell trait (HCC) Aug 18, 2017  . IUFD at 20 weeks or more of gestation 08/18/2017  . Fetal demise before 22 weeks with retention  of dead fetus Aug 18, 2017  . 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 Vaginal bleeding before [redacted] weeks gestation 10/07/2012   Objective: BP 108/77   Pulse 97   Temp 98.6 F (37 C) (Oral)   Resp 18   Ht 5' 2.5" (1.588 m)   Wt 104.3 kg   LMP 12/13/2017   BMI 41.40 kg/m  No intake/output data recorded. No intake/output data recorded. NST: FHR baseline 155 bpm, Variability: moderate, Accelerations:present, Decelerations:  Absent= Cat 1/Reactive CTX:  Q3-6 irregular, mild perception, lasting 40-60 seconds Uterus gravid, soft non tender, mild to palpate with contractions.  SVE:  Dilation: 4 Effacement (%): 60 Station: -2 Exam by:: Zayvier Caravello Pitocin at 11 mUn/min  IUPC placed, tolerated well AROM, mod, clear, tolerated well.   Assessment:  Pamela Erickson, 28 y.o., [phone], with an IUP @ [redacted]w[redacted]d, presented for IOL due to maternal history, TLOAC, LGA. Pt endorse + Fm. Denies vaginal leakage. Denies vaginal bleeding. Denies feeling cxt's. EFW 3988 gm, 92%, LGA. Baby Female. GBS-.   Pregnancy Problems  history of cesarean section (For FTP, desires TOL. VBAC CONSENT SIGNED 07/27/2018.)  history of intrauterine fetal death (20 week, 08-10-2017, from heart block, hydrops.Marland Kitchen MFM recommendations: Weekly limited scan for heart block from 16 weeks, detailed anatomy scan 18-20 weeks, serial [place] for growth q 4 weeks, close antenatal surveillance 3rd trimester. NORMAL FETAL ECHOS THROUGH 30 WEEKS.)  sickle cell trait (Monthly CM9)  fetal heart finding (Trivial/mild tricuspid regurgitation on echo, peds to f/u after delivery.)  anemia of pregnancy (06/22/18: Hgb 9.5, patient already on fusion plus. 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 TOC negative 05/2019.)  Sjgren's  syndrome (Positive SSA--referred back to rhematology pp. Seen by r heumatologist 11/2017, on Plaquinil) Patient Active Problem List   Diagnosis Date Noted  . History of intrauterine fetal death, currently pregnant, third trimester 09/16/2018  . Sjogren's syndrome with keratoconjunctivitis sicca (HCC) 02/27/2018  . Morbid obesity (HCC) 02/27/2018  . History of fetal demise, not currently pregnant 12/04/2017  . Vaginal delivery 08/02/2017  . Sickle cell trait (HCC) 08/05/2017  . IUFD at 20 weeks or more of gestation 2017-08-05  . Fetal demise before 22 weeks with retention of dead fetus 2017-08-05  . 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 Vaginal bleeding before [redacted] weeks gestation 10/07/2012  Pt currently stable on pitocin. Comfortable post epidural placement, foley place and IUPC placed.   NICHD: Category 1  Membranes: AROM, clear, mod @ 1930 on 3/11, no s/s of infection  Induction:    Pitocin - 11  Pain management:               Epidural placement: Placed 3/11 @ 1930  GBS Negative  Plan: Continue labor plan Continuous monitoring Rest/Frequent position changes to facilitate fetal rotation and descent. Will reassess with cervical exam at 0100 or earlier if necessary Continue pitocin per protocol Anticipate labor progression and vaginal delivery.   Dr Charlotta Newton aware of POC and verbalized agreement.   Dale [place], NP-C, CNM, [name] 09/16/2018. 8:11 PM

## 2018-09-16 NOTE — Progress Notes (Signed)
Labor Progress Note  Per H&P: TEQUIA PELHAM is a 28 y.o. female, G3P1101, IUP at 39.4 weeks, presenting for IOL due to maternal history, TLOAC, LGA. Pt endorse + Fm. Denies vaginal leakage. Denies vaginal bleeding. Denies feeling cxt's. EFW 3988 gm, 92%, LGA. Baby Female. GBS-.   Pregnancy Problems  history of cesarean section (For FTP, desires TOL. VBAC CONSENT SIGNED 07/27/2018.)  history of intrauterine fetal death (20 week, 07/29/2017, from heart block, hydrops.Marland Kitchen MFM recommendations: Weekly limited scan for heart block from 16 weeks, detailed anatomy scan 18-20 weeks, serial [place] for growth q 4 weeks, close antenatal surveillance 3rd trimester. NORMAL FETAL ECHOS THROUGH 30 WEEKS.)  sickle cell trait (Monthly CM9)  fetal heart finding (Trivial/mild tricuspid regurgitation on echo, peds to f/u after delivery.)  anemia of pregnancy (06/22/18: Hgb 9.5, patient already on fusion plus. 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 Seen by r heumatologist 11/2017, on Plaquinil)  Subjective: Pt in NAD with mom at bedside, stable. Feeling cxt, but mild, pt endorse feeling like cxt are more regular now.  Patient Active Problem List   Diagnosis Date Noted  . History of intrauterine fetal death, currently pregnant, third trimester 09/16/2018  . Sjogren's syndrome with keratoconjunctivitis sicca (HCC) 02/27/2018  . Morbid obesity (HCC) 02/27/2018  . History of fetal demise, not currently pregnant 12/04/2017  . Vaginal delivery 08/02/2017  . Sickle cell trait (HCC) 06-Aug-2017  . IUFD at 20 weeks or more of gestation 06-Aug-2017  . Fetal demise before 22 weeks with retention of  dead fetus 06-Aug-2017  . 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 Vaginal bleeding before [redacted] weeks gestation 10/07/2012   Objective: BP (!) 109/48   Pulse 94   Temp 98.2 F (36.8 C) (Oral)   Resp 16   Ht 5' 2.5" (1.588 m)   Wt 104.3 kg   LMP 12/13/2017   BMI 41.40 kg/m  No intake/output data recorded. No intake/output data recorded. NST: FHR baseline 145 bpm, Variability: moderate, Accelerations:present, Decelerations:  Absent= Cat 1/Reactive CTX:  none Uterus gravid, soft non tender, mild to palpate with contractions.  SVE:  Dilation: 3 Effacement (%): 60 Station: -2 Exam by:: [employer], CNM Pitocin at 8 mUn/min  Assessment:  IMAGENE LUBERDA, 28 y.o., [phone], with an IUP @ [redacted]w[redacted]d, presented for IOL due to maternal history, TLOAC, LGA. Pt endorse + Fm. Denies vaginal leakage. Denies vaginal bleeding. Denies feeling cxt's. EFW 3988 gm, 92%, LGA. Baby Female. GBS-.   Pregnancy Problems  history of cesarean section (For FTP, desires TOL. VBAC CONSENT SIGNED 07/27/2018.)  history of intrauterine fetal death (20 week, 29-Jul-2017, from heart block, hydrops.Marland Kitchen MFM recommendations: Weekly limited scan for heart block from 16 weeks, detailed anatomy scan 18-20 weeks, serial [place] for growth q 4 weeks, close antenatal surveillance 3rd trimester. NORMAL FETAL ECHOS THROUGH 30 WEEKS.)  sickle cell trait (Monthly CM9)  fetal heart finding (Trivial/mild tricuspid regurgitation on echo, peds to f/u after delivery.)  anemia of pregnancy (06/22/18: Hgb 9.5, patient already on fusion plus. 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 Seen by r heumatologist 11/2017, on  Plaquinil) Patient Active Problem List   Diagnosis Date Noted  . History of intrauterine fetal death, currently pregnant, third trimester 09/16/2018  . Sjogren's syndrome with keratoconjunctivitis sicca (HCC) 02/27/2018  . Morbid obesity (HCC) 02/27/2018  . History of fetal demise, not currently pregnant 12/04/2017  . Vaginal delivery 08/02/2017  . Sickle cell trait (HCC) 2017/08/11  . IUFD at 20 weeks or more of gestation August 11, 2017  . Fetal demise before 22 weeks with retention of dead fetus 2017/08/11  . 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 Vaginal bleeding before [redacted] weeks gestation 10/07/2012  Pt currently stable on pitocin.   NICHD: Category 1  Membranes: Intact, no s/s of infection  Induction:    Pitocin - 8  Pain management:               IV pain management: x PRN             Epidural placement: PRN  GBS Negative  Plan: Continue labor plan Continuous/intermittent monitoring Rest Ambulate Frequent position changes to facilitate fetal rotation and descent. Will reassess with cervical exam at 1700 or earlier if necessary Continue pitocin per protocol Anticipate AROM when cxt pattern established and post epidural placement.  Anticipate labor progression and vaginal delivery.   Md Su [place]ilt to be updated as needed.   Dale [place], NP-C, CNM, [name] 09/16/2018. 12:21 PM

## 2018-09-16 NOTE — Progress Notes (Signed)
Labor Progress Note  Per H&P: Pamela Erickson is a 28 y.o. female, G3P1101, IUP at 39.4 weeks, presenting for IOL due to maternal history, TLOAC, LGA. Pt endorse + Fm. Denies vaginal leakage. Denies vaginal bleeding. Denies feeling cxt's. EFW 3988 gm, 92%, LGA. Baby Female. GBS-.   Pregnancy Problems  history of cesarean section (For FTP, desires TOL. VBAC CONSENT SIGNED 07/27/2018.)  history of intrauterine fetal death (20 week, July 27, 2017, from heart block, hydrops.Marland Kitchen MFM recommendations: Weekly limited scan for heart block from 16 weeks, detailed anatomy scan 18-20 weeks, serial [place] for growth q 4 weeks, close antenatal surveillance 3rd trimester. NORMAL FETAL ECHOS THROUGH 30 WEEKS.)  sickle cell trait (Monthly CM9)  fetal heart finding (Trivial/mild tricuspid regurgitation on echo, peds to f/u after delivery.)  anemia of pregnancy (06/22/18: Hgb 9.5, patient already on fusion plus. 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 Seen by r heumatologist 11/2017, on Plaquinil)  Subjective: Pt in NAD with partner at bedside, pt feeling cxt 4/10, mildly, but wants epidural before AROM. Consulted with Dr Su Hilt whom requesting AROM. Talked with pt again about risk and benefit of AROM, pt verbally understand and consent to AROM post getting epidural , pt wants epidural now.  Patient Active Problem List   Diagnosis Date Noted  . History of intrauterine fetal death, currently pregnant, third trimester 09/16/2018  . Sjogren's syndrome with keratoconjunctivitis sicca (HCC) 02/27/2018  . Morbid obesity (HCC) 02/27/2018  . History of fetal demise, not currently pregnant 12/04/2017   . Vaginal delivery 08/02/2017  . Sickle cell trait (HCC) Aug 04, 2017  . IUFD at 20 weeks or more of gestation 2017/08/04  . Fetal demise before 22 weeks with retention of dead fetus August 04, 2017  . 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 Vaginal bleeding before [redacted] weeks gestation 10/07/2012   Objective: BP 124/80   Pulse 96   Temp 98 F (36.7 C) (Oral)   Resp 16   Ht 5' 2.5" (1.588 m)   Wt 104.3 kg   LMP 12/13/2017   BMI 41.40 kg/m  No intake/output data recorded. No intake/output data recorded. NST: FHR baseline 155 bpm, Variability: moderate, Accelerations:present, Decelerations:  Absent= Cat 1/Reactive CTX:  Q3-6 irregular, mild perception, lasting 40-60 seconds Uterus gravid, soft non tender, mild to palpate with contractions.  SVE:  Dilation: 3.5 Effacement (%): 60 Station: -2 Exam by:: [employer], CNM Pitocin at 11 mUn/min  Assessment:  Pamela Erickson, 28 y.o., [phone], with an IUP @ [redacted]w[redacted]d, presented for IOL due to maternal history, TLOAC, LGA. Pt endorse + Fm. Denies vaginal leakage. Denies vaginal bleeding. Denies feeling cxt's. EFW 3988 gm, 92%, LGA. Baby Female. GBS-.   Pregnancy Problems  history of cesarean section (For FTP, desires TOL. VBAC CONSENT SIGNED 07/27/2018.)  history of intrauterine fetal death (20 week, July 27, 2017, from heart block, hydrops.Marland Kitchen MFM recommendations: Weekly limited scan for heart block from 16 weeks, detailed anatomy scan 18-20 weeks, serial [place] for growth q 4 weeks, close antenatal surveillance 3rd trimester. NORMAL FETAL ECHOS THROUGH 30 WEEKS.)  sickle cell trait (Monthly CM9)  fetal heart finding (Trivial/mild tricuspid regurgitation on echo, peds to f/u after delivery.)  anemia of pregnancy (06/22/18: Hgb 9.5, patient  already on fusion plus. 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 Seen by r heumatologist 11/2017, on Plaquinil) Patient Active Problem List   Diagnosis Date Noted  . History of intrauterine fetal death, currently pregnant, third trimester 09/16/2018  . Sjogren's syndrome with keratoconjunctivitis sicca (HCC) 02/27/2018  . Morbid obesity (HCC) 02/27/2018  . History of fetal demise, not currently pregnant 12/04/2017  . Vaginal delivery 08/02/2017  . Sickle cell trait (HCC) 2017/08/28  . IUFD at 20 weeks or more of gestation 08-28-2017  . Fetal demise before 22 weeks with retention of dead fetus Aug 28, 2017  . 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 Vaginal bleeding before [redacted] weeks gestation 10/07/2012  Pt currently stable on pitocin. Requesting epidural placement now.   NICHD: Category 1  Membranes: Intact, no s/s of infection  Induction:    Pitocin - 11  Pain management:               IV pain management: x PRN             Epidural placement: Plan to prep for epidural now   GBS Negative  Plan: Continue labor plan Continuous monitoring Rest/Frequent position changes to facilitate fetal rotation and descent. Will reassess with cervical exam at 1900 or earlier if necessary Continue pitocin per protocol Plan to AROM post epidural placement, which anesthesia aware pt wants placement now.  Anticipate labor progression and vaginal delivery.   Md Su Hilt to be updated as needed, and report to be given to Dr Charlotta Newton at [phone].   Dale Jermyn, NP-C, CNM, [name] 09/16/2018. 6:49 PM

## 2018-09-16 NOTE — Anesthesia Pain Management Evaluation Note (Signed)
  CRNA Pain Management Visit Note  Patient: Pamela Erickson, 28 y.o., female  "Hello I am a member of the anesthesia team at [facility]. We have an anesthesia team available at all times to provide care throughout the hospital, including epidural management and anesthesia for C-section. I don't know your plan for the delivery whether it a natural birth, water birth, IV sedation, nitrous supplementation, doula or epidural, but we want to meet your pain goals."   1.Was your pain managed to your expectations on prior hospitalizations?   Yes   2.What is your expectation for pain management during this hospitalization?     Epidural  3.How can we help you reach that goal? Epidural in place and is working well.  Record the patient's initial score and the patient's pain goal.   Pain: 0  Pain Goal: 4 [facility] wants you to be able to say your pain was always managed very well.  [name] 09/16/2018

## 2018-09-16 NOTE — Anesthesia Preprocedure Evaluation (Signed)
Anesthesia Evaluation  Patient identified by MRN, date of birth, ID band Patient awake    Reviewed: Allergy & Precautions, H&P , Patient's Chart, lab work & pertinent test results  Airway Mallampati: II  TM Distance: >3 FB Neck ROM: full    Dental no notable dental hx.    Pulmonary    Pulmonary exam normal breath sounds clear to auscultation       Cardiovascular Exercise Tolerance: Good  Rhythm:regular Rate:Normal     Neuro/Psych    GI/Hepatic   Endo/Other    Renal/GU      Musculoskeletal   Abdominal (+) + obese,   Peds  Hematology  (+) anemia ,   Anesthesia Other Findings   Reproductive/Obstetrics (+) Pregnancy                             Anesthesia Physical  Anesthesia Plan  ASA: II  Anesthesia Plan: Epidural   Post-op Pain Management:    Induction:   PONV Risk Score and Plan:   Airway Management Planned:   Additional Equipment:   Intra-op Plan:   Post-operative Plan:   Informed Consent: I have reviewed the patients History and Physical, chart, labs and discussed the procedure including the risks, benefits and alternatives for the proposed anesthesia with the patient or authorized representative who has indicated his/her understanding and acceptance.     Dental Advisory Given  Plan Discussed with:   Anesthesia Plan Comments: (Labs checked- platelets confirmed with RN in room. Fetal heart tracing, per RN, reported to be stable enough for sitting procedure. Discussed epidural, and patient consents to the procedure:  included risk of possible headache,backache, failed block, allergic reaction, and nerve injury. This patient was asked if she had any questions or concerns before the procedure started.)        Anesthesia Quick Evaluation

## 2018-09-16 NOTE — H&P (Signed)
Pamela Erickson is a 28 y.o. female, G3P1101, IUP at 39.4 weeks, presenting for IOL due to maternal history, TLOAC, LGA. Pt endorse + Fm. Denies vaginal leakage. Denies vaginal bleeding. Denies feeling cxt's. EFW 3988 gm, 92%, LGA. Baby Female. GBS-.   Pregnancy Problems history of cesarean section (For FTP, desires TOL. VBAC CONSENT SIGNED 07/27/2018.) history of intrauterine fetal death (20 week, 30-Jul-2017, from heart block, hydrops.Marland Kitchen MFM recommendations: Weekly limited scan for heart block from 16 weeks, detailed anatomy scan 18-20 weeks, serial [place] for growth q 4 weeks, close antenatal surveillance 3rd trimester. NORMAL FETAL ECHOS THROUGH 30 WEEKS.) sickle cell trait (Monthly CM9) fetal heart finding (Trivial/mild tricuspid regurgitation on echo, peds to f/u after delivery.) anemia of pregnancy (06/22/18: Hgb 9.5, patient already on fusion plus. 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 TOC negative 05/2019.) Sjgren's syndrome (Positive SSA--referred back to rhematology pp. Seen by rheumatologist 11/2017, on Plaquinil)  Medications aspirin  cefdinir  Fusion Plus  hydroxychloroquine  nystatin  terconazole  valacyclovir  Vitafol Gummies   Patient Active Problem List   Diagnosis Date Noted   History of intrauterine fetal death, currently pregnant, third trimester 09/16/2018   Sjogren's syndrome with keratoconjunctivitis sicca (HCC) 02/27/2018   Morbid obesity (HCC) 02/27/2018   History of fetal demise, not currently pregnant 12/04/2017   Vaginal delivery 08/02/2017   Sickle cell trait (HCC) August 02, 2017   IUFD at 20 weeks or more of gestation 2017/08/02   Fetal demise before 22 weeks with retention of dead fetus 08-02-2017   Genital HSV 10/07/2012    Vaginal bleeding before [redacted] weeks gestation 10/07/2012   Medications Prior to Admission  Medication Sig Dispense Refill Last Dose   ASPIRIN 81 PO Take by mouth.   Past Week at Unknown time   Prenatal Vit-Fe Fumarate-FA (PRENATAL MULTIVITAMIN) TABS tablet Take 1 tablet by mouth every morning.   09/16/2018 at Unknown time   valACYclovir HCl (VALTREX PO) Take by mouth.   09/15/2018 at Unknown time   Hydroxychloroquine Sulfate (PLAQUENIL PO) Take by mouth.   Taking    Past Medical History:  Diagnosis Date   Abnormal Pap smear    last pap 01/2012   Anemia    During pregnancy   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   Sjogren's syndrome (HCC)    Torn ACL (anterior cruciate ligament) 2008   Vaginal Pap smear, abnormal    May 2018; biopsy was normal     No current facility-administered medications on file prior to encounter.    Current Outpatient Medications on File Prior to Encounter  Medication Sig Dispense Refill   ASPIRIN 81 PO Take by mouth.     Prenatal Vit-Fe Fumarate-FA (PRENATAL MULTIVITAMIN) TABS tablet Take 1 tablet by mouth every morning.     valACYclovir HCl (VALTREX PO) Take by mouth.     Hydroxychloroquine Sulfate (PLAQUENIL PO) Take by mouth.       Allergies  Allergen Reactions   Ibuprofen Rash    On hands    History of present pregnancy: Pt Info/Preference:  Screening/Consents:  Labs:   EDD: Estimated Date of Delivery: 09/19/18  Establised: Patient's last menstrual period was 12/13/2017.  Anatomy Scan: Date: 05/05/2018 Placenta Location: posterior Genetic Screen: Panoroma:low risk  female AFP: neg First Tri: Quad:  Office: [facility]            First PNV: 10.6 wg Blood Type A/Positive/-- (08/28 0000)  Language: Anthony Sar Last PNV: 39.2 wg Rhogam    Flu Vaccine:  Not UTD   Antibody Negative (08/28 0000)  TDaP vaccine Declined   GTT: Early: N/A Third Trimester: 87  Feeding Plan: Breast BTL: No Rubella: Immune (08/28 0000)  Contraception:  ??? VBAC: Yes consent signed 1/20 RPR: Nonreactive (08/28 0000)   Circumcision: N/A female   HBsAg: Negative (08/28 0000)  Pediatrician:  Maisie Fus   HIV: Non-reactive (08/28 0000)   Prenatal Classes: No Additional [place]: 3/4 growth see below GBS: Negative (02/20 0000)(For PCN allergy, check sensitivities)       Chlamydia: Neg    MFM Referral/Consult: Growth scan, fetal surveillance due to h/o IUFD hydrops, and ,maternal history.  GC: neg  Support Person: Jermaine   PAP: 2019-HPV  Pain Management: epidural Neonatologist Referral: Trivial/mild tricuspid regurgitation on echo, peds to f/u after delivery. Hgb Electrophoresis:  AS  Birth Plan: No   Hgb NOB: 11.5   28W: 9.5  09/09/2018 growth scan:  OB History    Gravida  3   Para  2   Term  1   Preterm  1   AB  0   Living  1     SAB  0   TAB  0   Ectopic  0   Multiple  0   Live Births  1          Past Medical History:  Diagnosis Date   Abnormal Pap smear    last pap 01/2012   Anemia    During pregnancy   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   Sjogren's syndrome (HCC)    Torn ACL (anterior cruciate ligament) 2008   Vaginal Pap smear, abnormal    May 2018; biopsy was normal   Past Surgical History:  Procedure Laterality Date   ANTERIOR CRUCIATE LIGAMENT REPAIR  2008   CESAREAN SECTION N/A 04/25/2013   Procedure: CESAREAN SECTION;  Surgeon: Michael Litter, MD;  Location: [facility];  Service: Obstetrics;  Laterality: N/A;   COLPOSCOPY     endocervical curettage  2018   Family History: family history includes Diabetes in her maternal aunt; Hypertension in her father, maternal grandfather, maternal grandmother, and mother. Social History:  reports that she has never smoked. She has never used smokeless tobacco. She reports current drug use. Drug: Marijuana. She reports that she does not drink alcohol.   Prenatal Transfer Tool  Maternal Diabetes: No Genetic Screening: Normal Maternal  Ultrasounds/Referrals: Abnormal:  Findings:   Other:LGA, Trivial/mild tricuspid regurgitation on echo, peds to f/u after delivery. Fetal Ultrasounds or other Referrals:  Fetal echo, Referred to Materal Fetal Medicine  Maternal Substance Abuse:  No Significant Maternal Medications:  Meds include: Other: See med list above, plaquenil  Significant Maternal Lab Results: Lab values include: Group B Strep negative  ROS:  Review of Systems  Constitutional: Negative.   HENT: Negative.   Eyes: Negative.   Respiratory: Negative.   Cardiovascular: Negative.   Gastrointestinal: Negative.   Genitourinary: Negative.   Musculoskeletal: Negative.   Skin: Negative.   Neurological: Negative.   Endo/Heme/Allergies: Negative.   Psychiatric/Behavioral: Negative.      Physical Exam: BP 120/69    Pulse 95    Temp 97.9 F (36.6 C) (Oral)    Resp 16  Ht 5' 2.5" (1.588 m)    Wt 104.3 kg    LMP 12/13/2017    BMI 41.40 kg/m   Physical Exam  Constitutional: She is oriented to person, place, and time and well-developed, well-nourished, and in no distress.  HENT:  Head: Normocephalic and atraumatic.  Eyes: Pupils are equal, round, and reactive to light. Conjunctivae are normal.  Neck: Normal range of motion. Neck supple.  Cardiovascular: Normal rate and regular rhythm.  Pulmonary/Chest: Effort normal and breath sounds normal.  Abdominal: Soft. Bowel sounds are normal.  Genitourinary:    Vagina, cervix and uterus normal.     Genitourinary Comments: Uterus gravida grater then dates, pelvis adequate for vaginal delivery, uterus soft, non-tender, small tear bleeding near vaginal opening on perineum. No lesion noted anywhere on vagina or cervix.    Musculoskeletal: Normal range of motion.  Neurological: She is alert and oriented to person, place, and time. Gait normal.  Skin: Skin is warm and dry.  Psychiatric: Affect normal.  Nursing note and vitals reviewed.    NST: FHR baseline 135 bpm, Variability:  moderate, Accelerations:present, Decelerations:  Absent= Cat 1/Reactive UC:   none SVE:   Dilation: 3 Effacement (%): 60 Station: -2 Exam by:: [facility] Hazaiah [place], CNM, vertex verified by fetal sutures.  Leopold's: Position vertex, EFW 8.5lbs via leopold's.   Labs: Results for orders placed or performed during the hospital encounter of 09/16/18 (from the past 24 hour(s))  CBC     Status: Abnormal   Collection Time: 09/16/18  8:16 AM  Result Value Ref Range   WBC 8.5 4.0 - 10.5 K/uL   RBC 4.07 3.87 - 5.11 MIL/uL   Hemoglobin 9.1 (L) 12.0 - 15.0 g/dL   HCT 44.029.4 (L) 34.736.0 - 42.546.0 %   MCV 72.2 (L) 80.0 - 100.0 fL   MCH 22.4 (L) 26.0 - 34.0 pg   MCHC 31.0 30.0 - 36.0 g/dL   RDW 95.617.2 (H) 38.711.5 - 56.415.5 %   Platelets 315 150 - 400 K/uL   nRBC 0.0 0.0 - 0.2 %    Imaging:  No results found.  MAU Course: Orders Placed This Encounter  Procedures   CBC   RPR   Diet clear liquid Room service appropriate? Yes; Fluid consistency: Thin   Discontinue Pitocin if tachysystole with non-reassuring FHR is present   Evaluate fetal heart rate to establish reassuring pattern prior to initiating Cytotec or Pitocin   If tachysystole WITH reassuring FHR present notify MD / CNM   Initiate intrauterine resuscitation if tachysystole with non-reasuring FHR is present   Labor Induction   May administer Terbutaline 0.25 mg SQ x 1 dose if tachysystole with non-reassuring FHR is presesnt   Nofify MD/CNM if tachysystole with non-reassuring FHR is present   Perform a cervical exam prior to initiating Cytotec or Pitocin   Activity as tolerated   Fetal monitoring per unit policy   Notify Physician   Vitals signs per unit policy   Order Rapid HIV per protocol if no results on chart   Cervical Exam   Discontinue foley prior to vaginal delivery   Fundal check post delivery every 15 min x 1 hour then every 30 min x 1 hour   If Rapid HIV test positive or known HIV positive: initiate AZT orders    Initiate Carrier Fluid Protocol   Initiate Oral Care Protocol   Insert foley catheter   May in and out cath x 2 for inability to void   Measure blood pressure post  delivery every 15 min x 1 hour then every 30 min x 1 hour   Patient may have epidural placement upon request   Full code   Oxygen therapy   Type and screen   Insert and maintain IV Line   Admit to Inpatient (patient's expected length of stay will be greater than 2 midnights or inpatient only procedure)   Meds ordered this encounter  Medications   oxytocin (PITOCIN) IV infusion 40 units in NS 1000 mL - Premix    Order Specific Question:   Begin infusion at:    Answer:   1 milli-unit/min (1.5 mL/hr)    Order Specific Question:   Increase infusion by:    Answer:   1 milli-unit/min (1.5 mL/hr)   terbutaline (BRETHINE) injection 0.25 mg   lactated ringers infusion   lactated ringers infusion 500-1,000 mL   oxytocin (PITOCIN) IV BOLUS FROM BAG   oxytocin (PITOCIN) IV infusion 40 units in NS 1000 mL - Premix   acetaminophen (TYLENOL) tablet 650 mg   fentaNYL (SUBLIMAZE) injection 50-100 mcg   lidocaine (PF) (XYLOCAINE) 1 % injection 30 mL   ondansetron (ZOFRAN) injection 4 mg   sodium citrate-citric acid (ORACIT) solution 30 mL   sodium phosphate (FLEET) 7-19 GM/118ML enema 1 enema    Assessment/Plan: Pamela Erickson is a 28 y.o. female, G3P1101, IUP at 39.4 weeks, presenting for IOL due to maternal history, TLOAC, LGA. Pt endorse + Fm. Denies vaginal leakage. Denies vaginal bleeding. Denies feeling cxt's. EFW 3988 gm, 92%, LGA. Baby Female. GBS-.   Pregnancy Problems history of cesarean section (For FTP, desires TOL. VBAC CONSENT SIGNED 07/27/2018.) history of intrauterine fetal death (20 week, 2017/08/13, from heart block, hydrops.Marland Kitchen MFM recommendations: Weekly limited scan for heart block from 16 weeks, detailed anatomy scan 18-20 weeks, serial [place] for growth q 4 weeks, close antenatal surveillance 3rd  trimester. NORMAL FETAL ECHOS THROUGH 30 WEEKS.) sickle cell trait (Monthly CM9) fetal heart finding (Trivial/mild tricuspid regurgitation on echo, peds to f/u after delivery.) anemia of pregnancy (06/22/18: Hgb 9.5, patient already on fusion plus. 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 TOC negative 05/2019.) Sjgren's syndrome (Positive SSA--referred back to rhematology pp. Seen by rheumatologist 11/2017, on Plaquinil)  FWB: Cat 1 Fetal Tracing.   I discussed with patient risks, benefits and alternatives of labor induction including higher risk of cesarean delivery compared to spontaneous labor.  We discussed risks of induction agents including effects on fetal heart beat, contraction pattern and need for close monitoring.  Patient expressed understanding of all this and desired to proceed with the induction. Risks and benefits of induction were reviewed, including failure of method, prolonged labor, need for further intervention, risk of cesarean.  Patient and family verbalized understanding and denies any further questions at this time. Pt and family wish to proceed with induction process. Discussed induction option of pitocin were reviewed as well as risks and benefits with use of each discussed.  Plan: Admit to Birthing Suite per consult with Dr Su Hilt Routine [facility] orders Pain med/epidural prn Plan to start with pitocin 1X1 Anticipate labor progression  Peds to follow baby post delivery for Trivial/mild tricuspid  Dr Su Hilt aware of plan of care and verbalized agreement with POC.   [facility] NP-C, CNM, [name]  09/16/2018, 10:06 AM

## 2018-09-17 ENCOUNTER — Encounter (HOSPITAL_COMMUNITY): Payer: Self-pay

## 2018-09-17 LAB — CBC
HCT: 29.3 % — ABNORMAL LOW (ref 36.0–46.0)
Hemoglobin: 9.2 g/dL — ABNORMAL LOW (ref 12.0–15.0)
MCH: 22.3 pg — ABNORMAL LOW (ref 26.0–34.0)
MCHC: 31.4 g/dL (ref 30.0–36.0)
MCV: 71.1 fL — ABNORMAL LOW (ref 80.0–100.0)
Platelets: 336 10*3/uL (ref 150–400)
RBC: 4.12 MIL/uL (ref 3.87–5.11)
RDW: 17.1 % — AB (ref 11.5–15.5)
WBC: 15.3 10*3/uL — ABNORMAL HIGH (ref 4.0–10.5)
nRBC: 0 % (ref 0.0–0.2)

## 2018-09-17 MED ORDER — SIMETHICONE 80 MG PO CHEW: 80.0000 mg | CHEWABLE_TABLET | ORAL | Status: DC | PRN

## 2018-09-17 MED ORDER — BENZOCAINE-MENTHOL 20-0.5 % EX AERO
INHALATION_SPRAY | CUTANEOUS | Status: AC
  Administered 2018-09-17: 1
  Filled 2018-09-17: qty 56

## 2018-09-17 MED ORDER — TETANUS-DIPHTH-ACELL PERTUSSIS 5-2.5-18.5 LF-MCG/0.5 IM SUSP: 0.5000 mL | Freq: Once | INTRAMUSCULAR | Status: DC

## 2018-09-17 MED ORDER — ONDANSETRON HCL 4 MG/2ML IJ SOLN: 4.0000 mg | INTRAMUSCULAR | Status: DC | PRN

## 2018-09-17 MED ORDER — DIPHENHYDRAMINE HCL 25 MG PO CAPS: 25.0000 mg | ORAL_CAPSULE | Freq: Four times a day (QID) | ORAL | Status: DC | PRN

## 2018-09-17 MED ORDER — ACETAMINOPHEN 325 MG PO TABS
650.0000 mg | ORAL_TABLET | ORAL | Status: DC | PRN
  Administered 2018-09-17 – 2018-09-19 (×9): 650 mg via ORAL
  Filled 2018-09-17 (×9): qty 2

## 2018-09-17 MED ORDER — ZOLPIDEM TARTRATE 5 MG PO TABS: 5.0000 mg | ORAL_TABLET | Freq: Every evening | ORAL | Status: DC | PRN

## 2018-09-17 MED ORDER — ONDANSETRON HCL 4 MG PO TABS: 4.0000 mg | ORAL_TABLET | ORAL | Status: DC | PRN

## 2018-09-17 MED ORDER — FERROUS SULFATE 325 (65 FE) MG PO TABS
325.0000 mg | ORAL_TABLET | Freq: Two times a day (BID) | ORAL | Status: DC
  Administered 2018-09-17 – 2018-09-19 (×5): 325 mg via ORAL
  Filled 2018-09-17 (×5): qty 1

## 2018-09-17 MED ORDER — COCONUT OIL OIL
1.0000 "application " | TOPICAL_OIL | Status: DC | PRN
  Administered 2018-09-17: 1 via TOPICAL

## 2018-09-17 MED ORDER — PRENATAL MULTIVITAMIN CH
1.0000 | ORAL_TABLET | Freq: Every day | ORAL | Status: DC
  Administered 2018-09-18 – 2018-09-19 (×2): 1 via ORAL
  Filled 2018-09-17 (×2): qty 1

## 2018-09-17 MED ORDER — MISOPROSTOL 200 MCG PO TABS
ORAL_TABLET | ORAL | Status: AC
  Filled 2018-09-17: qty 1

## 2018-09-17 MED ORDER — DIBUCAINE 1 % RE OINT: 1.0000 "application " | TOPICAL_OINTMENT | RECTAL | Status: DC | PRN

## 2018-09-17 MED ORDER — WITCH HAZEL-GLYCERIN EX PADS
1.0000 "application " | MEDICATED_PAD | CUTANEOUS | Status: DC | PRN
  Filled 2018-09-17: qty 100

## 2018-09-17 MED ORDER — SENNOSIDES-DOCUSATE SODIUM 8.6-50 MG PO TABS
2.0000 | ORAL_TABLET | ORAL | Status: DC
  Administered 2018-09-17 – 2018-09-18 (×2): 2 via ORAL
  Filled 2018-09-17 (×2): qty 2

## 2018-09-17 MED ORDER — ERYTHROMYCIN 5 MG/GM OP OINT
TOPICAL_OINTMENT | OPHTHALMIC | Status: AC
  Filled 2018-09-17: qty 1

## 2018-09-17 MED ORDER — METHYLERGONOVINE MALEATE 0.2 MG/ML IJ SOLN
INTRAMUSCULAR | Status: AC
  Administered 2018-09-17: 0.2 mg
  Filled 2018-09-17: qty 1

## 2018-09-17 MED ORDER — METHYLERGONOVINE MALEATE 0.2 MG/ML IJ SOLN: 0.2000 mg | Freq: Once | INTRAMUSCULAR | Status: DC

## 2018-09-17 MED ORDER — MISOPROSTOL 200 MCG PO TABS
ORAL_TABLET | ORAL | Status: AC
  Filled 2018-09-17: qty 4

## 2018-09-17 MED ORDER — MISOPROSTOL 200 MCG PO TABS
1000.0000 ug | ORAL_TABLET | Freq: Once | ORAL | Status: AC
  Administered 2018-09-17: 1000 ug via RECTAL

## 2018-09-17 MED ORDER — HYDROXYCHLOROQUINE SULFATE 200 MG PO TABS
400.0000 mg | ORAL_TABLET | Freq: Two times a day (BID) | ORAL | Status: DC
  Administered 2018-09-17 – 2018-09-19 (×5): 400 mg via ORAL
  Filled 2018-09-17 (×7): qty 2

## 2018-09-17 MED ORDER — BENZOCAINE-MENTHOL 20-0.5 % EX AERO
1.0000 "application " | INHALATION_SPRAY | CUTANEOUS | Status: DC | PRN
  Administered 2018-09-19: 1 via TOPICAL
  Filled 2018-09-17: qty 56

## 2018-09-17 MED ORDER — SODIUM CHLORIDE 0.9 % IV SOLN
3.0000 g | Freq: Three times a day (TID) | INTRAVENOUS | Status: DC
  Administered 2018-09-17: 3 g via INTRAVENOUS
  Filled 2018-09-17 (×2): qty 3

## 2018-09-17 NOTE — Anesthesia Postprocedure Evaluation (Signed)
Anesthesia Post Note  Patient: Pamela Erickson  Procedure(s) Performed: CESAREAN SECTION (N/A )     Patient location during evaluation: Mother Baby Anesthesia Type: Epidural Level of consciousness: awake and alert and oriented Pain management: satisfactory to patient Vital Signs Assessment: post-procedure vital signs reviewed and stable Respiratory status: respiratory function stable Cardiovascular status: stable Postop Assessment: no headache, no backache, epidural receding, patient able to bend at knees, no signs of nausea or vomiting and adequate PO intake Anesthetic complications: no    Last Vitals:  Vitals:   09/17/18 0617 09/17/18 0820  BP: (!) 100/49 (!) 113/92  Pulse: (!) 111 95  Resp: 18 20  Temp:  37.1 C  SpO2:  100%    Last Pain:  Vitals:   09/17/18 0820  TempSrc: Oral  PainSc: 8    Pain Goal:                   Pamela Erickson

## 2018-09-17 NOTE — Lactation Note (Signed)
This note was copied from a baby's chart. Lactation Consultation Note  Patient Name: Pamela Erickson [id number] Date: 09/17/2018 Reason for consult: Initial assessment Baby is 39.5 weeks/11 hours old.  Mom pumped and bottle fed her first baby for 6 months.  Baby is awake and starting to cue.  Assisted with positioning baby in cross cradle hold on right breast.  Baby opened wide and latched easily.  Good active suck/swallows noted.  Reviewed waking techniques and breast massage.  Instructed to feed with any cue and call for assist prn.  Maternal Data    Feeding Feeding Type: Breast Fed  LATCH Score Latch: Grasps breast easily, tongue down, lips flanged, rhythmical sucking.  Audible Swallowing: A few with stimulation  Type of Nipple: Everted at rest and after stimulation  Comfort (Breast/Nipple): Soft / non-tender  Hold (Positioning): Assistance needed to correctly position infant at breast and maintain latch.  LATCH Score: 8  Interventions    Lactation Tools Discussed/Used     Consult Status Consult Status: Follow-up Date: 09/18/18    Huston Foley 09/17/2018, 2:28 PM

## 2018-09-17 NOTE — Progress Notes (Signed)
Awaiting 328 to be ready for patient.  Continue recovery in L+D.

## 2018-09-17 NOTE — Progress Notes (Signed)
Labor Progress Note  Per H&P: BERNETHA DREYFUSS is a 28 y.o. female, G3P1101, IUP at 39.4 weeks, presenting for IOL due to maternal history, TLOAC, LGA. Pt endorse + Fm. Denies vaginal leakage. Denies vaginal bleeding. Denies feeling cxt's. EFW 3988 gm, 92%, LGA. Baby Female. GBS-.   Pregnancy Problems  history of cesarean section (For FTP, desires TOL. VBAC CONSENT SIGNED 07/27/2018.)  history of intrauterine fetal death (20 week, August 14, 2017, from heart block, hydrops.Marland Kitchen MFM recommendations: Weekly limited scan for heart block from 16 weeks, detailed anatomy scan 18-20 weeks, serial [place] for growth q 4 weeks, close antenatal surveillance 3rd trimester. NORMAL FETAL ECHOS THROUGH 30 WEEKS.)  sickle cell trait (Monthly CM9)  fetal heart finding (Trivial/mild tricuspid regurgitation on echo, peds to f/u after delivery.)  anemia of pregnancy (06/22/18: Hgb 9.5, patient already on fusion plus. 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 Seen by r heumatologist 11/2017, on Plaquinil)  Subjective: Pt in NAD with partner at bedside, pt comfortable post epidural placement, pt stated she would try to get some sleep. Patient Active Problem List   Diagnosis Date Noted  . History of intrauterine fetal death, currently pregnant, third trimester 09/16/2018  . Sjogren's syndrome with keratoconjunctivitis sicca (HCC) 02/27/2018  . Morbid obesity (HCC) 02/27/2018  . History of fetal demise, not currently pregnant 12/04/2017  . Vaginal delivery 08/02/2017  . Sickle cell trait (HCC) 2017-08-17  . IUFD at 20 weeks or more of gestation August 17, 2017  . Fetal demise before 22 weeks with  retention of dead fetus 08/17/2017  . 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 Vaginal bleeding before [redacted] weeks gestation 10/07/2012   Objective: BP (!) 112/53   Pulse (!) 107   Temp 98.9 F (37.2 C) (Oral)   Resp 18   Ht 5' 2.5" (1.588 m)   Wt 104.3 kg   LMP 12/13/2017   BMI 41.40 kg/m  No intake/output data recorded. No intake/output data recorded. NST: FHR baseline 155 bpm, Variability: moderate, Accelerations:present, Decelerations:  Absent= Cat 1/Reactive CTX:  Q3-6 irregular, mild perception, lasting 40-60 seconds Uterus gravid, soft non tender, mild to palpate with contractions.  SVE:  Dilation: 5 Effacement (%): 70 Station: -2 Exam by:: Hermenia Bers, RN Pitocin at 18 mUn/min  MVU: 190  Assessment:  [name], 28 y.o., G3P1101, with an IUP @ [redacted]w[redacted]d, presented for IOL due to maternal history, TLOAC, LGA. Pt endorse + Fm. Denies vaginal leakage. Denies vaginal bleeding. Denies feeling cxt's. EFW 3988 gm, 92%, LGA. Baby Female. GBS-.   Pregnancy Problems  history of cesarean section (For FTP, desires TOL. VBAC CONSENT SIGNED 07/27/2018.)  history of intrauterine fetal death (20 week, 08/14/17, from heart block, hydrops.Marland Kitchen MFM recommendations: Weekly limited scan for heart block from 16 weeks, detailed anatomy scan 18-20 weeks, serial [place] for growth q 4 weeks, close antenatal surveillance 3rd trimester. NORMAL FETAL ECHOS THROUGH 30 WEEKS.)  sickle cell trait (Monthly CM9)  fetal heart finding (Trivial/mild tricuspid regurgitation on echo, peds to f/u after delivery.)  anemia of pregnancy (06/22/18: Hgb 9.5, patient already on fusion plus. 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 TOC negative 05/2019.)  Sjgren's syndrome (Positive  SSA--referred back to rhematology pp. Seen by r heumatologist 11/2017, on Plaquinil) Patient Active Problem List   Diagnosis Date Noted  . History of intrauterine fetal death, currently pregnant, third trimester 09/16/2018  . Sjogren's syndrome with keratoconjunctivitis sicca (HCC) 02/27/2018  . Morbid obesity (HCC) 02/27/2018  . History of fetal demise, not currently pregnant 12/04/2017  . Vaginal delivery 08/02/2017  . Sickle cell trait (HCC) August 14, 2017  . IUFD at 20 weeks or more of gestation Aug 14, 2017  . Fetal demise before 22 weeks with retention of dead fetus 08/14/17  . 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 Vaginal bleeding before [redacted] weeks gestation 10/07/2012  Pt currently stable on pitocin. Comfortable and ready for sleep.   NICHD: Category 1  Membranes: AROM, clear, mod @ 1930 on 3/11, no s/s of infection  Induction:    Pitocin - 18  IUPC: MVU 190  Pain management:               Epidural placement: Placed 3/11 @ 1930  GBS Negative  Plan: Continue labor plan Continuous monitoring Rest/Frequent position changes to facilitate fetal rotation and descent. Will reassess with cervical exam in 4 hours or earlier if necessary Continue pitocin per protocol Anticipate labor progression and vaginal delivery.    Dale [place], NP-C, CNM, [name] 09/17/2018. 12:12 AM

## 2018-09-17 NOTE — Progress Notes (Signed)
Labor Progress Note  Per H&P: Pamela Erickson is a 28 y.o. female, G3P1101, IUP at 39.4 weeks, presenting for IOL due to maternal history, TLOAC, LGA. Pt endorse + Fm. Denies vaginal leakage. Denies vaginal bleeding. Denies feeling cxt's. EFW 3988 gm, 92%, LGA. Baby Female. GBS-.   Pregnancy Problems  history of cesarean section (For FTP, desires TOL. VBAC CONSENT SIGNED 07/27/2018.)  history of intrauterine fetal death (20 week, August 12, 2017, from heart block, hydrops.Marland Kitchen MFM recommendations: Weekly limited scan for heart block from 16 weeks, detailed anatomy scan 18-20 weeks, serial [place] for growth q 4 weeks, close antenatal surveillance 3rd trimester. NORMAL FETAL ECHOS THROUGH 30 WEEKS.)  sickle cell trait (Monthly CM9)  fetal heart finding (Trivial/mild tricuspid regurgitation on echo, peds to f/u after delivery.)  anemia of pregnancy (06/22/18: Hgb 9.5, patient already on fusion plus. 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 Seen by r heumatologist 11/2017, on Plaquinil)  Subjective: Pt in NAD with partner at bedside, pt comfortable post epidural placement, and endorses feeling pressure. RN reported at 0000 fetal baseline creep up to 170s. Fluid bolus given at that time with no resolution.  Patient Active Problem List   Diagnosis Date Noted  . History of intrauterine fetal death, currently pregnant, third trimester 09/16/2018  . Sjogren's syndrome with keratoconjunctivitis sicca (HCC) 02/27/2018  . Morbid obesity (HCC) 02/27/2018  . History of fetal demise, not currently pregnant 12/04/2017  . Vaginal delivery 08/02/2017  . Sickle cell trait (HCC) 08/20/2017  .  IUFD at 20 weeks or more of gestation Aug 20, 2017  . Fetal demise before 22 weeks with retention of dead fetus 2017/08/20  . 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 Vaginal bleeding before [redacted] weeks gestation 10/07/2012   Objective: BP (!) 112/53   Pulse (!) 107   Temp 99.1 F (37.3 C) (Oral)   Resp 18   Ht 5' 2.5" (1.588 m)   Wt 104.3 kg   LMP 12/13/2017   BMI 41.40 kg/m  No intake/output data recorded. No intake/output data recorded. NST: FHR baseline 170s bpm, Variability: moderate, Accelerations:present, Decelerations:  Absent= Cat 1-2/Reactive with pushing only variable decels noted, return to baseline when pushing stops.  CTX:  Q3-6 irregular, mild perception, lasting 40-60 seconds Uterus gravid, soft non tender, mild to palpate with contractions.  SVE:  Dilation: 10 Effacement (%): 100 Station: Plus 1 Exam by:: [facility], CNM Pitocin at 18 mUn/min  MVU: 200  Assessment:  Pamela Erickson, 28 y.o., [phone], with an IUP @ [redacted]w[redacted]d, presented for IOL due to maternal history, TLOAC, LGA. Pt endorse + Fm. Denies vaginal leakage. Denies vaginal bleeding. Denies feeling cxt's. EFW 3988 gm, 92%, LGA. Baby Female. GBS-.   Pregnancy Problems  history of cesarean section (For FTP, desires TOL. VBAC CONSENT SIGNED 07/27/2018.)  history of intrauterine fetal death (20 week, 2017/08/12, from heart block, hydrops.Marland Kitchen MFM recommendations: Weekly limited scan for heart block from 16 weeks, detailed anatomy scan 18-20 weeks, serial [place] for growth q 4 weeks, close antenatal surveillance 3rd trimester. NORMAL FETAL ECHOS THROUGH 30 WEEKS.)  sickle cell trait (Monthly CM9)  fetal heart finding (Trivial/mild tricuspid regurgitation on echo, peds to f/u after delivery.)  anemia of  pregnancy (06/22/18: Hgb 9.5, patient already on fusion plus. 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 Seen by r heumatologist 11/2017, on Plaquinil) Patient Active Problem List   Diagnosis Date Noted  . History of intrauterine fetal death, currently pregnant, third trimester 09/16/2018  . Sjogren's syndrome with keratoconjunctivitis sicca (HCC) 02/27/2018  . Morbid obesity (HCC) 02/27/2018  . History of fetal demise, not currently pregnant 12/04/2017  . Vaginal delivery 08/02/2017  . Sickle cell trait (HCC) 2017-08-02  . IUFD at 20 weeks or more of gestation 2017-08-02  . Fetal demise before 22 weeks with retention of dead fetus 08/02/2017  . 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 Vaginal bleeding before [redacted] weeks gestation 10/07/2012  Pt currently stable on pitocin. Pt complete @ +1 dission to push was based off of matneral need, and suspected triple 1, but Attempted to push for 20 mins, but baby still high and due to variables decels noted, will labor down x1 hour. Pt given peanut ball. Cat 2  NICHD: Category 1-2, bolus given. Position change.   Membranes: AROM, clear, mod @ 1930 on 3/11, possible chorio suspected, maternal temp 99, maternal tachycardia noted HR 107, and fetal baseline 170s, bolus was given post noting increased and it did not resolve, pt started on 3gm unasyn now.   Induction:    Pitocin - 18  IUPC: MVU 200  Pain management:               Epidural placement: Placed 3/11 @ 1930  GBS Negative  Plan: Continue labor plan Continuous monitoring Rest/Frequent position changes to facilitate fetal rotation and descent. Will reassess in 1 hour post laboring down. Continue pitocin per protocol Anticipate labor progression and vaginal delivery.    Dr Charlotta Newton updated on POC.   Dale [place], NP-C, CNM, [name] 09/17/2018. 1:30 AM

## 2018-09-18 NOTE — Lactation Note (Addendum)
This note was copied from a Pamela's chart. Lactation Consultation Note  Patient Name: Pamela Erickson ATFTD'D Date: 09/18/2018   Pamela Erickson now 92 hours old.  Inquired about breastfeeding.  Mom reports that she breastfeeds fine that mom wants to do breastfeeding and bottlefeeding with both breastmilk and formula.  Mom reports she has a DEBP at home. Mom has not inittiated pumping here.  Mom reports she would like to start pumping here.  Got mom DEBP, Kit, and pumping accesories.  Came back.  Mom reports its time for her to eat.  Minimal  assist with breastfeeding.  Left all equipment there.  Urged mom to start pump[ing every time infant gets a bottle.  Explained that the pumping may help her have more milk later. Urged mom to pump and hand express everytime infant gets a bottle.  Urged mom to call lactation as needed. Feeding    LATCH Score                   Interventions    Lactation Tools Discussed/Used     Consult Status      Harolyn Cocker Thompson Caul 09/18/2018, 9:39 PM

## 2018-09-18 NOTE — Progress Notes (Signed)
Subjective: Postpartum Day 1: Vaginal delivery, 2 laceration and right labial Patient up ad lib, reports no syncope or dizziness. Feeding:  breastfeeding Contraceptive plan: mini pill  Objective: Vital signs in last 24 hours: Temp:  [98.5 F (36.9 C)-99.4 F (37.4 C)] 98.5 F (36.9 C) (03/13 0527) Pulse Rate:  [76-90] 85 (03/13 0527) Resp:  [18-20] 18 (03/13 0527) BP: (108-118)/(66-75) 110/75 (03/13 0527) SpO2:  [98 %-99 %] 98 % (03/13 0527)  Physical Exam:  General: alert, cooperative and no distress Lochia: appropriate Uterine Fundus: firm Perineum: healing well DVT Evaluation: No evidence of DVT seen on physical exam.   CBC Latest Ref Rng & Units 09/17/2018 09/16/2018 08/02/2017  WBC 4.0 - 10.5 K/uL 15.3(H) 8.5 6.9  Hemoglobin 12.0 - 15.0 g/dL 4.0(J) 8.1(X) 9.1(Y)  Hematocrit 36.0 - 46.0 % 29.3(L) 29.4(L) 28.1(L)  Platelets 150 - 400 K/uL 336 315 290     Assessment/Plan: Status post vaginal delivery day 1. Stable Continue current care. Plan for discharge tomorrow, Breastfeeding and Lactation consult    Henderson Newcomer ProtheroCNM 09/18/2018, 9:03 AM

## 2018-09-19 DIAGNOSIS — O34219 Maternal care for unspecified type scar from previous cesarean delivery: Secondary | ICD-10-CM

## 2018-09-19 MED ORDER — HYDROXYCHLOROQUINE SULFATE 200 MG PO TABS: 400.0000 mg | ORAL_TABLET | Freq: Every day | ORAL | 0 refills | Status: AC

## 2018-09-19 MED ORDER — FERROUS SULFATE 325 (65 FE) MG PO TABS: 325.0000 mg | ORAL_TABLET | Freq: Every morning | ORAL | 0 refills | Status: DC

## 2018-09-19 MED ORDER — SENNOSIDES-DOCUSATE SODIUM 8.6-50 MG PO TABS: 1.0000 | ORAL_TABLET | ORAL | 0 refills | Status: AC

## 2018-09-19 NOTE — Progress Notes (Signed)
Post Partum Day 2 s/p VBAC complicated by PPH and chorioamnionitis dx by fetal and maternal tachycardia; afebrile throughout labor and postpartum. Antibiotics d/c 24h post birth.  Subjective: no complaints, up ad lib, voiding, tolerating PO, + flatus and some mild low back pain from epidural. c/o cough and some sinus congestion  Objective: Blood pressure 113/71, pulse 88, temperature 98.1 F (36.7 C), temperature source Oral, resp. rate 18, height 5' 2.5" (1.588 m), weight 104.3 kg, last menstrual period 12/13/2017, SpO2 99 %, unknown if currently breastfeeding.  Physical Exam:  General: alert, cooperative and no distress Lochia: appropriate Uterine Fundus: firm DVT Evaluation: No cords or calf tenderness. No significant calf/ankle edema.  Recent Labs    09/17/18 0746  HGB 9.2*  HCT 29.3*    Assessment/Plan: Discharge home Talked about saline nasal rinse, humidifier, cough drops to help with cough. F/u with PCP if worsens, if she develops fever or SOB.  BF and formula feeding per her preference.    LOS: 3 days   Pamela Erickson 09/19/2018, 8:47 AM

## 2018-09-19 NOTE — Discharge Summary (Signed)
OB Discharge Summary     Patient Name: Pamela Erickson DOB: 1991/01/05 MRN: [id number]  Date of admission: 09/16/2018 Delivering MD: Dale [place]   Date of discharge: 09/19/2018  Admitting diagnosis: pregnancy Intrauterine pregnancy: [redacted]w[redacted]d     Secondary diagnosis:  Active Problems:   Postpartum hemorrhage   VBAC, delivered  Additional problems: anemia due to blood loss      Discharge diagnosis: VBAC                                                                                                Post partum procedures:none  Augmentation: Pitocin  Complications: Intrauterine Inflammation or infection (Chorioamniotis) and Hemorrhage>1053mL  Hospital course:  Induction of Labor With Vaginal Delivery   28 y.o. yo [id number] at [redacted]w[redacted]d was admitted to the hospital 09/16/2018 for induction of labor.  Indication for induction: elective - h/o 20w IUFD with multiple fetal anomalies, h/o CS and VBAC x1. .  Patient had an uncomplicated labor course as follows: Membrane Rupture Time/Date: 7:22 PM ,09/16/2018   Intrapartum Procedures: Episiotomy: None [1]                                         Lacerations:  2nd degree [3]  Patient had delivery of a Viable infant.  Information for the patient's newborn:  Jacqueline, [place]cker [[id number]]  Delivery Method: Vag-Spont   09/17/2018  Details of delivery can be found in separate delivery note.  Patient had a routine postpartum course. Patient is discharged home 09/19/18.  Physical exam  Vitals:   09/17/18 2143 09/18/18 0527 09/18/18 1431 09/19/18 0630  BP: 112/71 110/75 107/71 113/71  Pulse: 76 85 88   Resp:  18 20 18   Temp:  98.5 F (36.9 C) 97.9 F (36.6 C) 98.1 F (36.7 C)  TempSrc:  Oral Axillary Oral  SpO2: 99% 98% 99%   Weight:      Height:        Labs: Lab Results  Component Value Date   WBC 15.3 (H) 09/17/2018   HGB 9.2 (L) 09/17/2018   HCT 29.3 (L) 09/17/2018   MCV 71.1 (L) 09/17/2018   PLT 336 09/17/2018   CMP Latest Ref  Rng & Units 07/26/2012  Glucose 70 - 99 mg/dL 82  BUN 6 - 23 mg/dL 11  Creatinine 4.23 - 5.36 mg/dL 1.44  Sodium 315 - 400 mEq/L 140  Potassium 3.5 - 5.1 mEq/L 3.8  Chloride 96 - 112 mEq/L 103    Discharge instruction: per After Visit Summary and "Baby and Me Booklet".  After visit meds:  Allergies as of 09/19/2018      Reactions   Ibuprofen Rash   On hands      Medication List    STOP taking these medications   aspirin EC 81 MG tablet   Fusion Plus Caps   nystatin cream Commonly known as:  MYCOSTATIN     TAKE these medications   ferrous sulfate 325 (65 FE) MG tablet Take 1 tablet (  325 mg total) by mouth every morning for 30 days.   hydroxychloroquine 200 MG tablet Commonly known as:  PLAQUENIL Take 2 tablets (400 mg total) by mouth daily for 30 days. What changed:  when to take this   prenatal multivitamin Tabs tablet Take 1 tablet by mouth every morning.   senna-docusate 8.6-50 MG tablet Commonly known as:  Senokot-S Take 1 tablet by mouth daily for 10 days. Start taking on:  September 20, 2018   valACYclovir 500 MG tablet Commonly known as:  VALTREX Take 500 mg by mouth at bedtime.       Diet: regular  Activity: Advance as tolerated. Pelvic rest for 6 weeks.   Outpatient follow up:6 weeks Follow up Appt: Future Appointments  Date Time Provider Department Center  11/12/2018  2:00 PM Arnette Felts, FNP [facility] None   Follow up Visit:No follow-ups on file.  Postpartum contraception: Progesterone only pills  Newborn Data: Live born female  Birth Weight: 8 lb 2.2 oz (3691 g) APGAR: 8, 9  Newborn Delivery   Birth date/time:  09/17/2018 03:09:00 Delivery type:  VBAC, Spontaneous     Baby Feeding: Bottle and pumping/bottle feeding breastmilk Disposition:home with mother   09/19/2018 Faylene Million Herlinda Heady, CNM

## 2018-09-19 NOTE — Lactation Note (Signed)
This note was copied from a baby's chart. Lactation Consultation Note; Mom request visit from [facility]. Wants assist with DEBP. Setup, use and cleaning of pump pieces reviewed with mom. Mom pumping and reports is comfortable. Baby waking.  Mom reports baby has been nursing well. Baby latched well and still nursing as I left room. Reports breasts are feeling fuller this morning. Has Medela pump for home. Reviewed engorgement prevention and treatment. No questions at present. Reviewed our phone number, OP appointments and BFSG as resources for support after DC. To call prn  Patient Name: Pamela Erickson [id number]'I Date: 09/19/2018 Reason for consult: Follow-up assessment   Maternal Data Formula Feeding for Exclusion: No Has patient been taught Hand Expression?: Yes Does the patient have breastfeeding experience prior to this delivery?: Yes  Feeding Feeding Type: Breast Fed  LATCH Score Latch: Grasps breast easily, tongue down, lips flanged, rhythmical sucking.  Audible Swallowing: A few with stimulation  Type of Nipple: Everted at rest and after stimulation  Comfort (Breast/Nipple): Soft / non-tender  Hold (Positioning): Assistance needed to correctly position infant at breast and maintain latch.  LATCH Score: 8  Interventions Interventions: Breast feeding basics reviewed;Breast massage;Breast compression;Expressed milk  Lactation Tools Discussed/Used WIC Program: No Pump Review: Setup, frequency, and cleaning Initiated by:: DW Date initiated:: 09/19/18   Consult Status Consult Status: Complete    [name] 09/19/2018, 7:56 AM

## 2018-10-15 DIAGNOSIS — R399 Unspecified symptoms and signs involving the genitourinary system: Secondary | ICD-10-CM | POA: Diagnosis not present

## 2018-10-15 DIAGNOSIS — O9123 Nonpurulent mastitis associated with lactation: Secondary | ICD-10-CM | POA: Diagnosis not present

## 2018-10-20 ENCOUNTER — Telehealth: Payer: Self-pay

## 2018-10-20 NOTE — Telephone Encounter (Signed)
Called pt to reschedule her physical from May to Late June per provider request. Adolph Pollack

## 2018-10-28 DIAGNOSIS — B373 Candidiasis of vulva and vagina: Secondary | ICD-10-CM | POA: Diagnosis not present

## 2018-10-28 DIAGNOSIS — Z3040 Encounter for surveillance of contraceptives, unspecified: Secondary | ICD-10-CM | POA: Diagnosis not present

## 2018-10-28 DIAGNOSIS — R32 Unspecified urinary incontinence: Secondary | ICD-10-CM | POA: Diagnosis not present

## 2018-11-12 ENCOUNTER — Encounter: Payer: Self-pay | Admitting: Nurse Practitioner

## 2019-01-04 ENCOUNTER — Encounter: Payer: Self-pay | Admitting: Nurse Practitioner

## 2019-01-25 ENCOUNTER — Encounter: Payer: Self-pay | Admitting: Nurse Practitioner

## 2019-02-15 ENCOUNTER — Other Ambulatory Visit: Payer: Self-pay

## 2019-02-16 ENCOUNTER — Encounter: Payer: Self-pay | Admitting: Nurse Practitioner

## 2019-02-16 ENCOUNTER — Ambulatory Visit: Payer: Medicaid Other | Admitting: Nurse Practitioner

## 2019-02-16 VITALS — BP 118/82 | HR 85 | Temp 98.0°F | Ht 62.4 in | Wt 220.4 lb

## 2019-02-16 DIAGNOSIS — Z0000 Encounter for general adult medical examination without abnormal findings: Secondary | ICD-10-CM | POA: Diagnosis not present

## 2019-02-16 DIAGNOSIS — D573 Sickle-cell trait: Secondary | ICD-10-CM

## 2019-02-16 DIAGNOSIS — M3501 Sicca syndrome with keratoconjunctivitis: Secondary | ICD-10-CM

## 2019-02-16 LAB — POCT URINALYSIS DIPSTICK
Bilirubin, UA: NEGATIVE
Blood, UA: NEGATIVE
Glucose, UA: NEGATIVE
Ketones, UA: NEGATIVE
Leukocytes, UA: NEGATIVE
Nitrite, UA: NEGATIVE
Protein, UA: NEGATIVE
Spec Grav, UA: 1.025 (ref 1.010–1.025)
Urobilinogen, UA: 0.2 E.U./dL
pH, UA: 6.5 (ref 5.0–8.0)

## 2019-02-16 NOTE — Progress Notes (Signed)
Subjective:     Patient ID: Pamela Erickson , female    DOB: [birth year] , 28 y.o.   MRN: [id number]   Chief Complaint  Patient presents with  . Annual Exam   The patient states she uses Mini pill for birth control. Last LMP was Patient's last menstrual period was 02/11/2019.. Negative for Dysmenorrhea and Negative for Menorrhagia  Negative for: breast discharge, breast lump(s), breast pain and breast self exam.  Pertinent negatives include abnormal bleeding (hematology), anxiety, decreased libido, depression, difficulty falling sleep, dyspareunia, history of infertility, nocturia, sexual dysfunction, sleep disturbances, urinary incontinence, urinary urgency, vaginal discharge and vaginal itching. Diet regular. The patient states her exercise level is   walking and she is calorie counting.   The patient's tobacco use is:  Social History   Tobacco Use  Smoking Status Never Smoker  Smokeless Tobacco Never Used   She has been exposed to passive smoke. The patient's alcohol use is:  Social History   Substance and Sexual Activity  Alcohol Use No  . Frequency: Never   Comment: OCC   Additional information: Last pap May 2019  HPI  Here for HM.  She is having a cycle on the 3rd week instead of the 4th week of mini pill.  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     Wt Readings from Last 3 Encounters: 02/16/19 : 220 lb 6.4 oz (100 kg) 09/16/18 : 230 lb (104.3 kg) 06/06/18 : 213 lb (96.6 kg)  She is walking 30-40 minutes - days a week. Using my plate for her calorie. She is postpartum 5 months.    Sjoren's - taking plaquenil       Past Medical History:  Diagnosis Date  . Abnormal Pap smear    last pap 01/2012  . Anemia    During pregnancy  . Herpes   . 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 Sjogren's syndrome ([place])   . Torn ACL (anterior cruciate ligament) 2008  . Vaginal Pap smear, abnormal    May 2018; biopsy was normal     Family History  Problem Relation Age of  Onset  . Hypertension Mother   . Hypertension Father   . Diabetes Maternal Aunt   . Hypertension Maternal Grandmother   . Hypertension Maternal Grandfather      Current Outpatient Medications:  .  hydroxychloroquine (PLAQUENIL) 200 MG tablet, Take 400 mg by mouth daily. Take 2 tablets daily, Disp: , Rfl:  .  Prenatal Vit-Fe Fumarate-FA (PRENATAL MULTIVITAMIN) TABS tablet, Take 1 tablet by mouth every morning., Disp: , Rfl:  .  valACYclovir (VALTREX) 500 MG tablet, Take 500 mg by mouth at bedtime., Disp: , Rfl:  .  ferrous sulfate 325 (65 FE) MG tablet, Take 1 tablet (325 mg total) by mouth every morning for 30 days., Disp: 30 tablet, Rfl: 0   Allergies  Allergen Reactions  . Ibuprofen Rash    On hands     Review of Systems  Constitutional: Negative.   HENT: Negative.   Eyes: Negative.   Respiratory: Negative.   Cardiovascular: Negative.   Gastrointestinal: Negative.   Endocrine: Negative.   Genitourinary: Negative.   Musculoskeletal: Negative.   Skin: Negative.   Allergic/Immunologic: Negative.   Neurological: Negative.   Hematological: Negative.   Psychiatric/Behavioral: Negative.      Today's Vitals   02/16/19 1421  BP: 118/82  Pulse: 85  Temp: 98 F (36.7 C)  TempSrc: Oral  Weight: 220 lb 6.4 oz (  100 kg)  Height: 5' 2.4" (1.585 m)  PainSc: 0-No pain   Body mass index is 39.8 kg/m.   Objective:  Physical Exam Constitutional:      Appearance: Normal appearance. She is well-developed.  HENT:     Head: Normocephalic and atraumatic.     Right Ear: Hearing, tympanic membrane, ear canal and external ear normal.     Left Ear: Hearing, tympanic membrane, ear canal and external ear normal.  Eyes:     General: Lids are normal.     Conjunctiva/sclera: Conjunctivae normal.     Pupils: Pupils are equal, round, and reactive to light.     Funduscopic exam:    Right eye: No papilledema.        Left eye: No papilledema.  Neck:     Musculoskeletal: Full passive  range of motion without pain, normal range of motion and neck supple.     Thyroid: No thyroid mass.     Vascular: No carotid bruit.  Cardiovascular:     Rate and Rhythm: Normal rate and regular rhythm.     Pulses: Normal pulses.     Heart sounds: Normal heart sounds. No murmur.  Pulmonary:     Effort: Pulmonary effort is normal.     Breath sounds: Normal breath sounds.  Abdominal:     General: Abdomen is flat. Bowel sounds are normal.     Palpations: Abdomen is soft.  Musculoskeletal: Normal range of motion.        General: No swelling.     Right lower leg: No edema.     Left lower leg: No edema.  Skin:    General: Skin is warm and dry.     Capillary Refill: Capillary refill takes less than 2 seconds.  Neurological:     General: No focal deficit present.     Mental Status: She is alert and oriented to person, place, and time.     Cranial Nerves: No cranial nerve deficit.     Sensory: No sensory deficit.  Psychiatric:        Mood and Affect: Mood normal.        Behavior: Behavior normal.        Thought Content: Thought content normal.        Judgment: Judgment normal.         Assessment And Plan:     1. Encounter for general adult medical examination w/o abnormal findings . Behavior modifications discussed and diet history reviewed.   . Pt will continue to exercise regularly and modify diet with low GI, plant based foods and decrease intake of processed foods.  . Recommend intake of daily multivitamin, Vitamin D, and calcium.  . Recommend for preventive screenings, as well as recommend immunizations that include TDAP (up to date) - POCT Urinalysis Dipstick (81002) - POCT UA - Microalbumin - BMP8+Anion Gap - Lipid Profile - CBC no Diff - Hemoglobin A1c - Vitamin D (25 hydroxy)  2. Morbid obesity (HCC)  Chronic  Discussed healthy diet and regular exercise options   Encouraged to exercise at least 150 minutes per week with 2 days of strength training  3. Sjogren's  syndrome with keratoconjunctivitis sicca (HCC)  Being followed by Rheumatology and tolerating Plaquenil  4. Sickle cell trait (HCC)  No concerns  Encouraged to stay well hydrated with water       Pamela FeltsJanece Aviya Jarvie, FNP    THE PATIENT IS ENCOURAGED TO PRACTICE SOCIAL DISTANCING DUE TO THE COVID-19 PANDEMIC.

## 2019-02-16 NOTE — Patient Instructions (Addendum)
Health Maintenance  Topic Date Due   PAP-Cervical Cytology Screening  01/24/2012   PAP SMEAR-Modifier  01/24/2012   INFLUENZA VACCINE  02/06/2019   TETANUS/TDAP  04/27/2023   HIV Screening  Completed   Health Maintenance, Female Adopting a healthy lifestyle and getting preventive care are important in promoting health and wellness. Ask your health care provider about:  The right schedule for you to have regular tests and exams.  Things you can do on your own to prevent diseases and keep yourself healthy. What should I know about diet, weight, and exercise? Eat a healthy diet   Eat a diet that includes plenty of vegetables, fruits, low-fat dairy products, and lean protein.  Do not eat a lot of foods that are high in solid fats, added sugars, or sodium. Maintain a healthy weight Body mass index (BMI) is used to identify weight problems. It estimates body fat based on height and weight. Your health care provider can help determine your BMI and help you achieve or maintain a healthy weight. Get regular exercise Get regular exercise. This is one of the most important things you can do for your health. Most adults should:  Exercise for at least 150 minutes each week. The exercise should increase your heart rate and make you sweat (moderate-intensity exercise).  Do strengthening exercises at least twice a week. This is in addition to the moderate-intensity exercise.  Spend less time sitting. Even light physical activity can be beneficial. Watch cholesterol and blood lipids Have your blood tested for lipids and cholesterol at 28 years of age, then have this test every 5 years. Have your cholesterol levels checked more often if:  Your lipid or cholesterol levels are high.  You are older than 28 years of age.  You are at high risk for heart disease. What should I know about cancer screening? Depending on your health history and family history, you may need to have cancer screening  at various ages. This may include screening for:  Breast cancer.  Cervical cancer.  Colorectal cancer.  Skin cancer.  Lung cancer. What should I know about heart disease, diabetes, and high blood pressure? Blood pressure and heart disease  High blood pressure causes heart disease and increases the risk of stroke. This is more likely to develop in people who have high blood pressure readings, are of African descent, or are overweight.  Have your blood pressure checked: ? Every 3-5 years if you are 8118-28 years of age. ? Every year if you are 28 years old or older. Diabetes Have regular diabetes screenings. This checks your fasting blood sugar level. Have the screening done:  Once every three years after age 28 if you are at a normal weight and have a low risk for diabetes.  More often and at a younger age if you are overweight or have a high risk for diabetes. What should I know about preventing infection? Hepatitis B If you have a higher risk for hepatitis B, you should be screened for this virus. Talk with your health care provider to find out if you are at risk for hepatitis B infection. Hepatitis C Testing is recommended for:  Everyone born from [address]1945 through 1965.  Anyone with known risk factors for hepatitis C. Sexually transmitted infections (STIs)  Get screened for STIs, including gonorrhea and chlamydia, if: ? You are sexually active and are younger than 28 years of age. ? You are older than 28 years of age and your health care provider tells  you that you are at risk for this type of infection. ? Your sexual activity has changed since you were last screened, and you are at increased risk for chlamydia or gonorrhea. Ask your health care provider if you are at risk.  Ask your health care provider about whether you are at high risk for HIV. Your health care provider may recommend a prescription medicine to help prevent HIV infection. If you choose to take medicine to  prevent HIV, you should first get tested for HIV. You should then be tested every 3 months for as long as you are taking the medicine. Pregnancy  If you are about to stop having your period (premenopausal) and you may become pregnant, seek counseling before you get pregnant.  Take 400 to 800 micrograms (mcg) of folic acid every day if you become pregnant.  Ask for birth control (contraception) if you want to prevent pregnancy. Osteoporosis and menopause Osteoporosis is a disease in which the bones lose minerals and strength with aging. This can result in bone fractures. If you are 28 years old or older, or if you are at risk for osteoporosis and fractures, ask your health care provider if you should:  Be screened for bone loss.  Take a calcium or vitamin D supplement to lower your risk of fractures.  Be given hormone replacement therapy (HRT) to treat symptoms of menopause. Follow these instructions at home: Lifestyle  Do not use any products that contain nicotine or tobacco, such as cigarettes, e-cigarettes, and chewing tobacco. If you need help quitting, ask your health care provider.  Do not use street drugs.  Do not share needles.  Ask your health care provider for help if you need support or information about quitting drugs. Alcohol use  Do not drink alcohol if: ? Your health care provider tells you not to drink. ? You are pregnant, may be pregnant, or are planning to become pregnant.  If you drink alcohol: ? Limit how much you use to 0-1 drink a day. ? Limit intake if you are breastfeeding.  Be aware of how much alcohol is in your drink. In the [place] one drink equals one 12 oz bottle of beer (355 mL), one 5 oz glass of wine (148 mL), or one 1 oz glass of hard liquor (44 mL). General instructions  Schedule regular health, dental, and eye exams.  Stay current with your vaccines.  Tell your health care provider if: ? You often feel depressed. ? You have ever been  abused or do not feel safe at home. Summary  Adopting a healthy lifestyle and getting preventive care are important in promoting health and wellness.  Follow your health care provider's instructions about healthy diet, exercising, and getting tested or screened for diseases.  Follow your health care provider's instructions on monitoring your cholesterol and blood pressure. This information is not intended to replace advice given to you by your health care provider. Make sure you discuss any questions you have with your health care provider. Document Released: 01/07/2011 Document Revised: 06/17/2018 Document Reviewed: 06/17/2018 Elsevier Patient Education  2020 [employer]  [place]  Walking is a great form of exercise to increase your strength, endurance and overall fitness.  A walking program can help you start slowly and gradually build endurance as you go.  Everyone's ability is different, so each person's starting point will be different.  You do not have to follow them exactly.  The are just samples. You should simply find out what's right  for you and stick to that program.   In the beginning, you'll start off walking 2-3 times a day for short distances.  As you get stronger, you'll be walking further at just 1-2 times per day.  A. You Can Walk For A Certain Length Of Time Each Day    Walk 5 minutes 3 times per day.  Increase 2 minutes every 2 days (3 times per day).  Work up to 25-30 minutes (1-2 times per day).   Example:   Day 1-2 5 minutes 3 times per day   Day 7-8 12 minutes 2-3 times per day   Day 13-14 25 minutes 1-2 times per day  B. You Can Walk For a Certain Distance Each Day     Distance can be substituted for time.    Example:   3 trips to mailbox (at road)   3 trips to corner of block   3 trips around the block  C. Go to local high school and use the track.      Please only do the exercises that your therapist has initialed and dated

## 2019-02-17 LAB — CBC
Hematocrit: 38.4 % (ref 34.0–46.6)
Hemoglobin: 12.3 g/dL (ref 11.1–15.9)
MCH: 23.3 pg — ABNORMAL LOW (ref 26.6–33.0)
MCHC: 32 g/dL (ref 31.5–35.7)
MCV: 73 fL — ABNORMAL LOW (ref 79–97)
Platelets: 510 10*3/uL — ABNORMAL HIGH (ref 150–450)
RBC: 5.27 x10E6/uL (ref 3.77–5.28)
RDW: 17.1 % — ABNORMAL HIGH (ref 11.7–15.4)
WBC: 4.1 10*3/uL (ref 3.4–10.8)

## 2019-02-17 LAB — LIPID PANEL
Chol/HDL Ratio: 2.6 ratio (ref 0.0–4.4)
Cholesterol, Total: 139 mg/dL (ref 100–199)
HDL: 53 mg/dL
LDL Calculated: 65 mg/dL (ref 0–99)
Triglycerides: 103 mg/dL (ref 0–149)
VLDL Cholesterol Cal: 21 mg/dL (ref 5–40)

## 2019-02-17 LAB — VITAMIN D 25 HYDROXY (VIT D DEFICIENCY, FRACTURES): Vit D, 25-Hydroxy: 24.4 ng/mL — ABNORMAL LOW (ref 30.0–100.0)

## 2019-02-17 LAB — BMP8+ANION GAP
Anion Gap: 12 mmol/L (ref 10.0–18.0)
BUN/Creatinine Ratio: 10 (ref 9–23)
BUN: 10 mg/dL (ref 6–20)
CO2: 22 mmol/L (ref 20–29)
Calcium: 9.4 mg/dL (ref 8.7–10.2)
Chloride: 104 mmol/L (ref 96–106)
Creatinine, Ser: 1.04 mg/dL — ABNORMAL HIGH (ref 0.57–1.00)
GFR calc Af Amer: 84 mL/min/{1.73_m2} (ref >59)
GFR calc non Af Amer: 73 mL/min/{1.73_m2} (ref >59)
Glucose: 80 mg/dL (ref 65–99)
Potassium: 4.3 mmol/L (ref 3.5–5.2)
Sodium: 138 mmol/L (ref 134–144)

## 2019-02-17 LAB — HEMOGLOBIN A1C
Est. average glucose Bld gHb Est-mCnc: 105 mg/dL
Hgb A1c MFr Bld: 5.3 % (ref 4.8–5.6)

## 2019-03-02 DIAGNOSIS — Z79899 Other long term (current) drug therapy: Secondary | ICD-10-CM | POA: Diagnosis not present

## 2019-03-02 DIAGNOSIS — M3501 Sicca syndrome with keratoconjunctivitis: Secondary | ICD-10-CM | POA: Diagnosis not present

## 2019-03-02 DIAGNOSIS — R768 Other specified abnormal immunological findings in serum: Secondary | ICD-10-CM | POA: Diagnosis not present

## 2019-04-22 DIAGNOSIS — R87610 Atypical squamous cells of undetermined significance on cytologic smear of cervix (ASC-US): Secondary | ICD-10-CM | POA: Diagnosis not present

## 2019-04-22 DIAGNOSIS — R87612 Low grade squamous intraepithelial lesion on cytologic smear of cervix (LGSIL): Secondary | ICD-10-CM | POA: Diagnosis not present

## 2019-04-22 DIAGNOSIS — Z113 Encounter for screening for infections with a predominantly sexual mode of transmission: Secondary | ICD-10-CM | POA: Diagnosis not present

## 2019-04-23 LAB — HM PAP SMEAR: HM Pap smear: ABNORMAL

## 2019-04-27 ENCOUNTER — Encounter: Payer: Self-pay | Admitting: Nurse Practitioner

## 2019-08-24 ENCOUNTER — Other Ambulatory Visit: Payer: Self-pay

## 2019-08-24 ENCOUNTER — Ambulatory Visit (INDEPENDENT_AMBULATORY_CARE_PROVIDER_SITE_OTHER): Payer: Medicaid Other | Admitting: Nurse Practitioner

## 2019-08-24 ENCOUNTER — Encounter: Payer: Self-pay | Admitting: Nurse Practitioner

## 2019-08-24 VITALS — BP 122/86 | HR 72 | Temp 98.7°F | Ht 62.4 in | Wt 229.4 lb

## 2019-08-24 DIAGNOSIS — Z6841 Body Mass Index (BMI) 40.0 and over, adult: Secondary | ICD-10-CM

## 2019-08-24 NOTE — Patient Instructions (Signed)

## 2019-08-24 NOTE — Progress Notes (Signed)
This visit occurred during the SARS-CoV-2 public health emergency.  Safety protocols were in place, including screening questions prior to the visit, additional usage of staff PPE, and extensive cleaning of exam room while observing appropriate contact time as indicated for disinfecting solutions.  Subjective:     Patient ID: Pamela Erickson , female    DOB: 03-30-1991 , 29 y.o.   MRN: [id number]   Chief Complaint  Patient presents with  . Weight Check    HPI  Here for weight loss. She is no longer breast feeding stopped approximately.  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  Has not had covid or the vaccine. She has had weight concerns for quite some time. She has tried calorie counting with a lot of fluctuation.  She has a Research scientist (physical sciences) at [employer].  She will walk 30 minutes on the treadmill and elliptical 25-30 minutes.  May do 1-2 miles on the bike and 3 times a week. She has also been doing youtube work outs (dancing). She has been doing this for the last 3 weeks.    Diet consists of skipping breakfast, then a big lunch (will eat chicken, rice, pasta, and potatoes).  Does admit to snacking a lot with a big dinner. She will get home about 830p -930p.  She is usually running errands late in the evening. She does eat out a lot with fast food.    She has noticed maybe some changes in her clothes fitting more loosely. She is drinking more water.    Wt Readings from Last 3 Encounters: 08/24/19 : 229 lb 6.4 oz (104.1 kg) 02/16/19 : 220 lb 6.4 oz (100 kg) 09/16/18 : 230 lb (104.3 kg)     Past Medical History:  Diagnosis Date  . Abnormal Pap smear    last pap 01/2012  . Anemia    During pregnancy  . Herpes   . 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 Sjogren's syndrome (HCC)   . Torn ACL (anterior cruciate ligament) 2008  . Vaginal Pap smear, abnormal    May 2018; biopsy was normal     Family History  Problem Relation Age of Onset  . Hypertension Mother   . Hypertension  Father   . Diabetes Maternal Aunt   . Hypertension Maternal Grandmother   . Hypertension Maternal Grandfather      Current Outpatient Medications:  .  hydroxychloroquine (PLAQUENIL) 200 MG tablet, Take 400 mg by mouth daily. Take 2 tablets daily, Disp: , Rfl:  .  valACYclovir (VALTREX) 500 MG tablet, Take 500 mg by mouth at bedtime., Disp: , Rfl:    Allergies  Allergen Reactions  . Ibuprofen Rash    On hands     Review of Systems  Constitutional: Negative.   Respiratory: Negative.  Negative for cough.   Cardiovascular: Negative.  Negative for chest pain, palpitations and leg swelling.  Neurological: Negative for dizziness and headaches.  Psychiatric/Behavioral: Negative.      Today's Vitals   08/24/19 1407  BP: 122/86  Pulse: 72  Temp: 98.7 F (37.1 C)  Weight: 229 lb 6.4 oz (104.1 kg)  Height: 5' 2.4" (1.585 m)  PainSc: 0-No pain   Body mass index is 41.42 kg/m.   Objective:  Physical Exam Constitutional:      General: She is not in acute distress.    Appearance: Normal appearance. She is obese.  Cardiovascular:     Rate and Rhythm: Normal rate and regular rhythm.  Pulses: Normal pulses.     Heart sounds: Normal heart sounds.  Pulmonary:     Effort: Pulmonary effort is normal. No respiratory distress.     Breath sounds: Normal breath sounds.  Skin:    Capillary Refill: Capillary refill takes less than 2 seconds.  Neurological:     General: No focal deficit present.     Mental Status: She is alert and oriented to person, place, and time.  Psychiatric:        Mood and Affect: Mood normal.        Behavior: Behavior normal.        Thought Content: Thought content normal.        Judgment: Judgment normal.         Assessment And Plan:    1. Morbid obesity ([place])  Will try her on phentermine, discussed side effects if has elevated heart rate to contact the office  EKG done with NSR  Discussed importance of healthy diet and regular exercise  2. BMI  40.0-44.9, adult (HCC)     ASK- Given permission to discuss weight today ASSESS - Body mass index is 41.42 kg/m., well tolerated ADVISE  - on risk of cardiovascular disease currently has a diagnosis of hypertension, on medications. AGREE - to increase her physical activity of walking during commercials ASSIST - prescription for phentermine given  Short term goal - lose 15 lbs Long term goal - weight 175-180 lbs.      Minette Brine, FNP    THE PATIENT IS ENCOURAGED TO PRACTICE SOCIAL DISTANCING DUE TO THE COVID-19 PANDEMIC.

## 2019-08-30 ENCOUNTER — Encounter: Payer: Self-pay | Admitting: Nurse Practitioner

## 2019-08-30 ENCOUNTER — Telehealth: Payer: Self-pay

## 2019-08-30 MED ORDER — PHENTERMINE HCL 15 MG PO CAPS: 15.0000 mg | ORAL_CAPSULE | ORAL | 1 refills | Status: DC

## 2019-08-30 NOTE — Telephone Encounter (Signed)
Patient notified that her medicine has been sent to the pharmacy. YRL,RMA

## 2019-10-25 ENCOUNTER — Ambulatory Visit: Payer: Medicaid Other | Admitting: Nurse Practitioner

## 2019-10-25 ENCOUNTER — Encounter: Payer: Self-pay | Admitting: Nurse Practitioner

## 2019-10-25 ENCOUNTER — Other Ambulatory Visit: Payer: Self-pay

## 2019-10-25 DIAGNOSIS — Z6838 Body mass index (BMI) 38.0-38.9, adult: Secondary | ICD-10-CM | POA: Diagnosis not present

## 2019-10-25 MED ORDER — PHENTERMINE HCL 37.5 MG PO CAPS: 37.5000 mg | ORAL_CAPSULE | ORAL | 1 refills | Status: DC

## 2019-10-25 NOTE — Progress Notes (Signed)
This visit occurred during the SARS-CoV-2 public health emergency.  Safety protocols were in place, including screening questions prior to the visit, additional usage of staff PPE, and extensive cleaning of exam room while observing appropriate contact time as indicated for disinfecting solutions.  Subjective:     Patient ID: Pamela Erickson , female    DOB: [birth year] , 29 y.o.   MRN: [id number]   Chief Complaint  Patient presents with  . Weight Loss    HPI  Wt Readings from Last 3 Encounters: 10/25/19 : 208 lb 9.6 oz (94.6 kg) 08/24/19 : 229 lb 6.4 oz (104.1 kg) 02/16/19 : 220 lb 6.4 oz (100 kg)  She is taking phentermine denies any difficulty with sleeping, chest pain, or heart palpitations. She is not eating as much junk food, she is eating healthier foods.  She has recently gotten married on Saturday.  She is doing a low calorie diet. She has not been exercising due to planning her wedding. She is planning to go to [employer] at least 3 times a week.  Has not had her Covid vaccine. She has a history of Sjorgen's and she is concerned about the correlation for Autoimmune.      Past Medical History:  Diagnosis Date  . Abnormal Pap smear    last pap 01/2012  . Anemia    During pregnancy  . Herpes   . 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 Sjogren's syndrome (HCC)   . Torn ACL (anterior cruciate ligament) 2008  . Vaginal Pap smear, abnormal    May 2018; biopsy was normal     Family History  Problem Relation Age of Onset  . Hypertension Mother   . Hypertension Father   . Diabetes Maternal Aunt   . Hypertension Maternal Grandmother   . Hypertension Maternal Grandfather      Current Outpatient Medications:  .  phentermine 15 MG capsule, Take 1 capsule (15 mg total) by mouth every morning., Disp: 30 capsule, Rfl: 1 .  valACYclovir (VALTREX) 500 MG tablet, Take 500 mg by mouth at bedtime., Disp: , Rfl:  .  hydroxychloroquine (PLAQUENIL) 200 MG tablet, Take 400 mg  by mouth daily. Take 2 tablets daily, Disp: , Rfl:    Allergies  Allergen Reactions  . Ibuprofen Rash    On hands     Review of Systems  Constitutional: Negative.   Respiratory: Negative.   Cardiovascular: Negative for chest pain, palpitations and leg swelling.  Neurological: Negative for dizziness and headaches.  Psychiatric/Behavioral: Negative.      Today's Vitals   10/25/19 1411  BP: 120/84  Pulse: 77  Temp: 97.9 F (36.6 C)  Weight: 208 lb 9.6 oz (94.6 kg)  Height: 5' 1.6" (1.565 m)   Body mass index is 38.65 kg/m.   Objective:  Physical Exam Constitutional:      Appearance: Normal appearance.  Cardiovascular:     Rate and Rhythm: Normal rate and regular rhythm.     Pulses: Normal pulses.     Heart sounds: Normal heart sounds. No murmur.  Pulmonary:     Effort: Pulmonary effort is normal. No respiratory distress.     Breath sounds: Normal breath sounds.  Neurological:     General: No focal deficit present.     Mental Status: She is alert and oriented to person, place, and time.     Cranial Nerves: No cranial nerve deficit.  Psychiatric:        Mood and  Affect: Mood normal.        Behavior: Behavior normal.        Thought Content: Thought content normal.        Judgment: Judgment normal.         Assessment And Plan:     1. Morbid obesity ([employer])  She has lost 11 lbs since her last office visit. ASK- here for weight check ASSESS - Body mass index is 38.65 kg/m., well tolerated, WAIST CIRCUMFERENCE  ADVISE  - on risk of cardiovascular disease currently has a diagnosis of hypertension, on medications. AGREE - she will go to the gym at least 3 days a week  ASSIST - continue with Phentermine will increase to 37.5 mg daily - phentermine 37.5 MG capsule; Take 1 capsule (37.5 mg total) by mouth every morning.  Dispense: 30 capsule; Refill: 1  2. BMI 38.0-38.9,adult  - phentermine 37.5 MG capsule; Take 1 capsule (37.5 mg total) by mouth every morning.   Dispense: 30 capsule; Refill: 1   Minette Brine, FNP    THE PATIENT IS ENCOURAGED TO PRACTICE SOCIAL DISTANCING DUE TO THE COVID-19 PANDEMIC.

## 2019-12-28 ENCOUNTER — Ambulatory Visit: Payer: Medicaid Other | Admitting: Nurse Practitioner

## 2020-01-02 IMAGING — US US MFM OB LIMITED
1 series · 15 of 16 positions shown · non-contrast
Comparison: none

[Series 1: us mfm ob limited · 16 acquisitions, 15 frames shown]
[im 1/16]
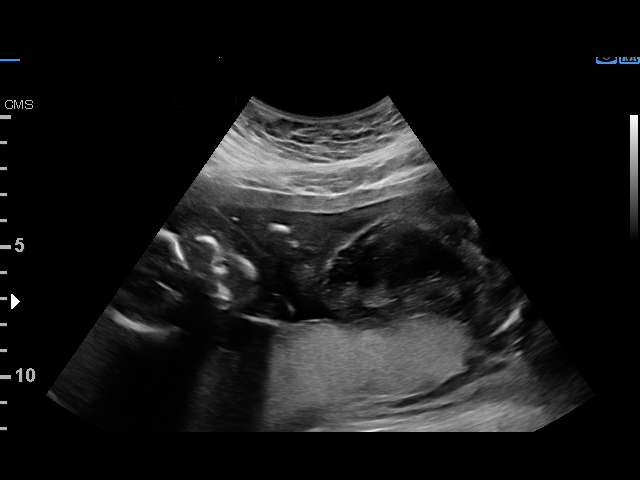
[im 2/16]
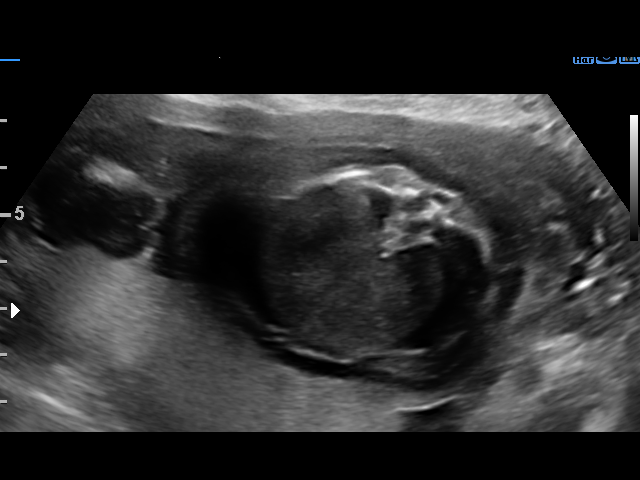
[im 3/16]
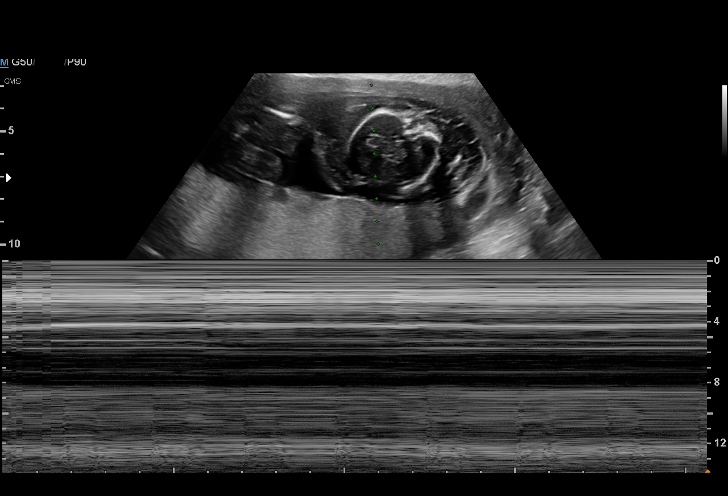
[im 4/16]
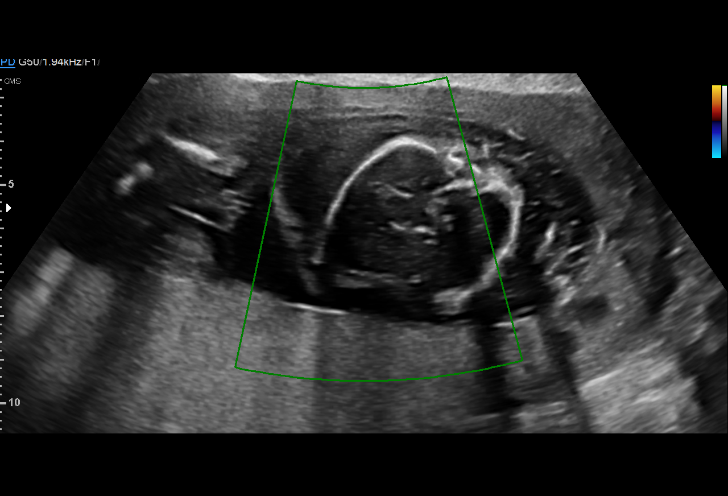
[im 5/16]
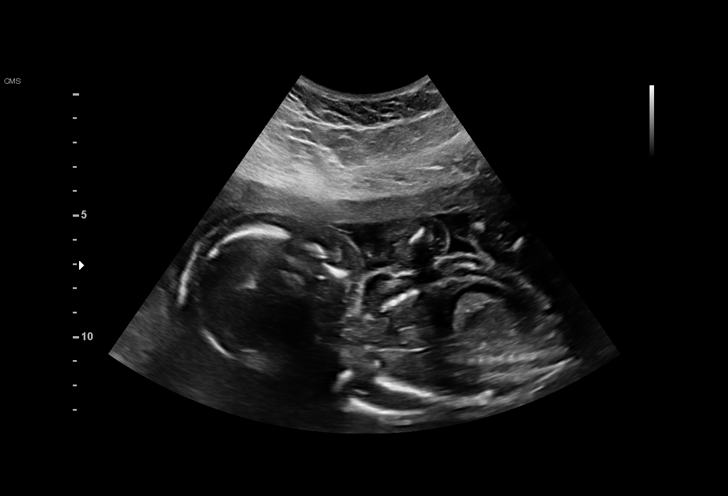
[im 6/16]
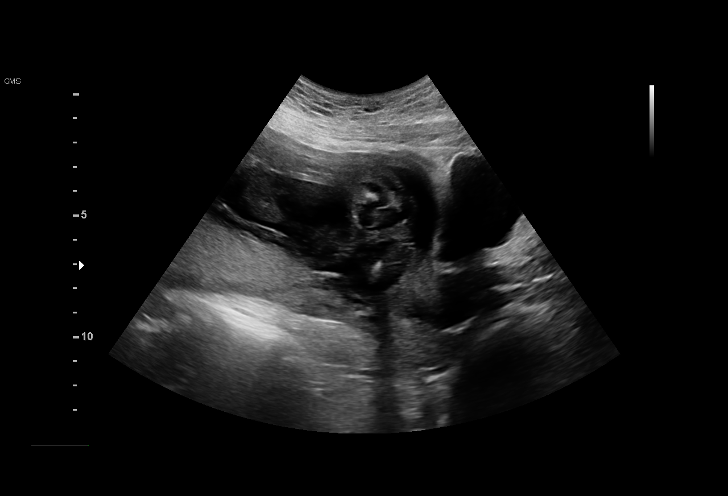
[im 7/16]
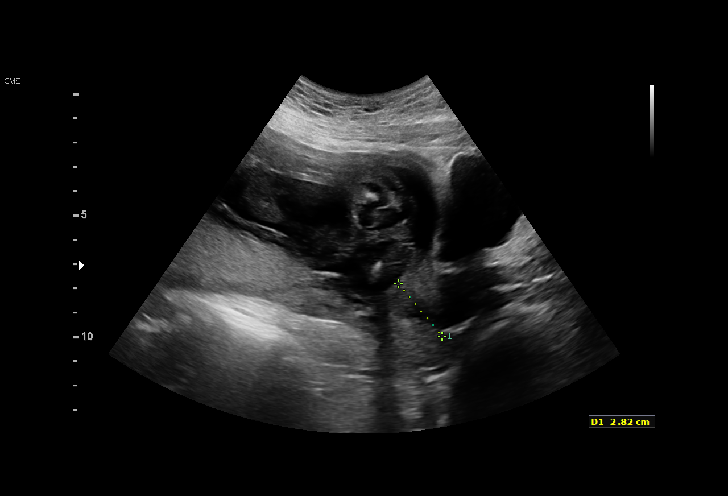
[im 9/16]
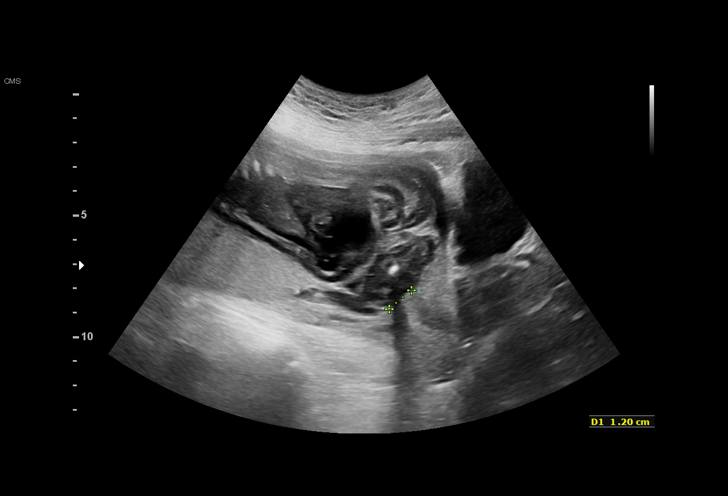
[im 10/16]
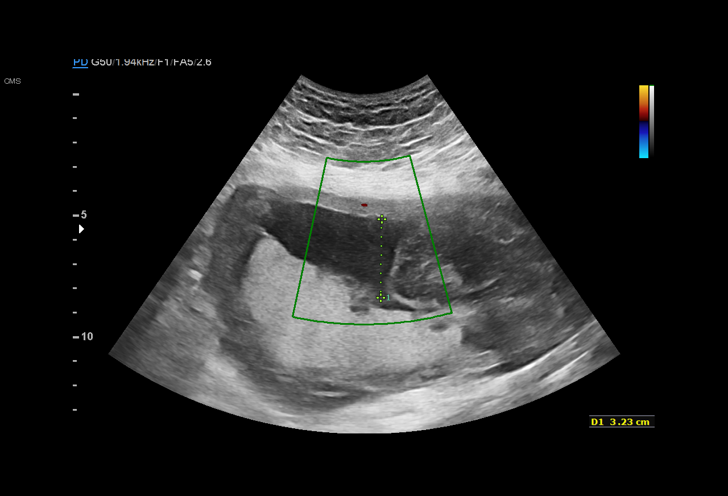
[im 11/16]
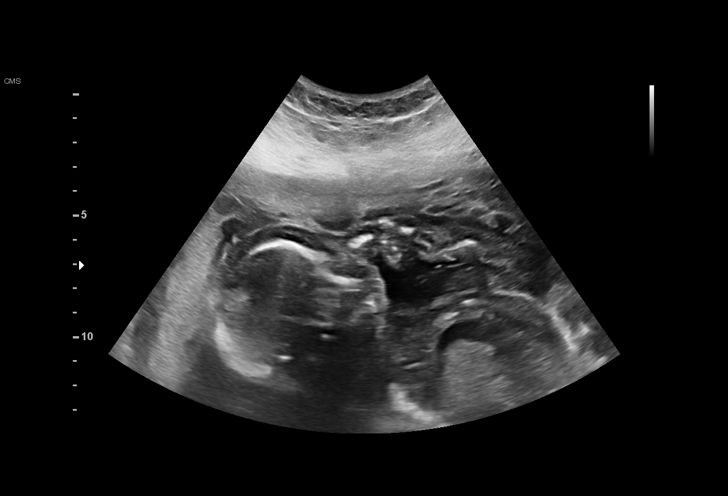
[im 12/16]
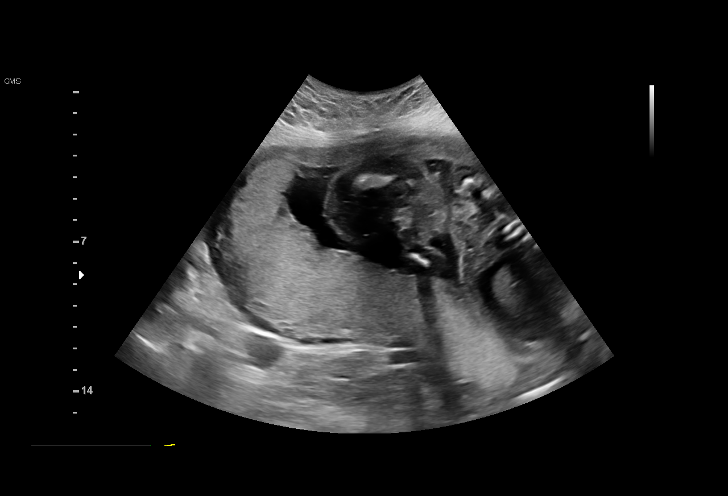
[im 13/16]
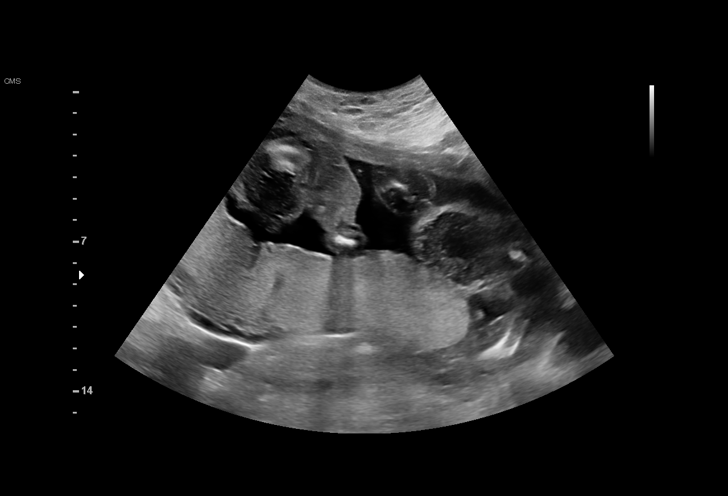
[im 14/16]
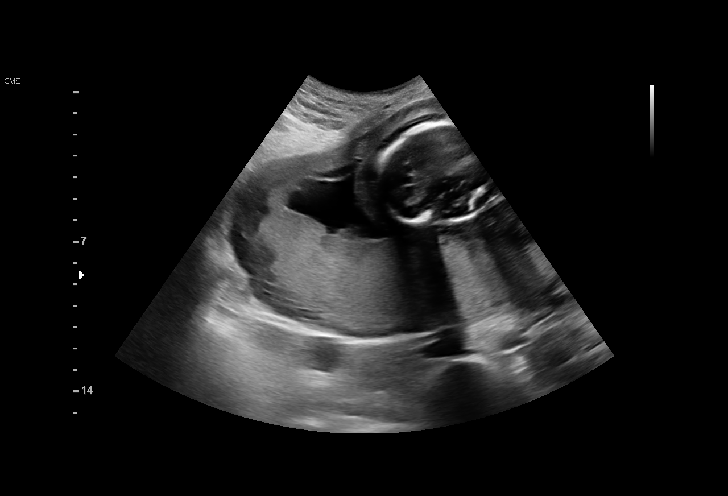
[im 15/16]
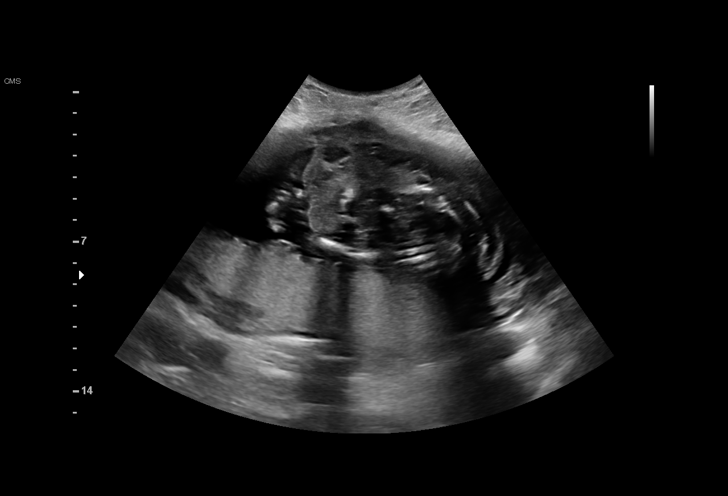
[im 16/16]
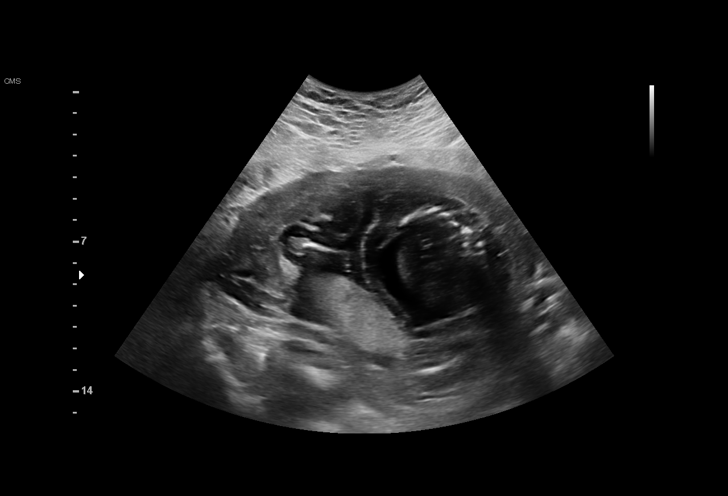

[15 of 16 positions shown; findings below may reference images not displayed]

#[id number]

1  HALDERG JOFFRE RIMASSA NARANJO              [id number]      [id number]     [id number]
Indications

22 weeks gestation of pregnancy
History of cesarean delivery, currently
pregnant
Abnormal fetal ultrasound (pericardial
effusion, ascites, and bradycardia seen on
ultrasound in office)
Medical complication of pregnancy (specify);
+ SS A and + SS B antibodies; ?
undiagnosed autoimmune disorder
Encounter for other antenatal screening
follow-up
OB History

Gravidity:    2         Term:   1
Living:       1
Fetal Evaluation

Num Of Fetuses:     1
Cardiac Activity:   Absent
Presentation:       Breech
Placenta:           Posterior, low-lying, 1.2 cm from int os

Amniotic Fluid
AFI FV:      Subjectively within normal limits

Largest Pocket(cm)
3.23
Gestational Age
LMP:           21w 4d        Date:  03/02/17                 EDD:   12/07/17
Best:          22w 0d     Det. By:  Early Ultrasound         EDD:   12/04/17
(05/26/17)
Cervix Uterus Adnexa

Cervix
Length:            2.8  cm.
Normal appearance by transabdominal scan.

Uterus
No abnormality visualized.
Impression

Fetal demise at 22+0 weeks due to third degree heart block
with subsequent heart failure
Hydrops with significant skin edema and ascites
Normal amniotic fluid volume
Posterior low-lying placenta; inferior edge 
 1.2 cms from
internal os

The US findings were shared with Ms. Holcomb and her
mother.

Discussed with Dr. Maruyama.
Recommendations

Ms. Holcomb desired induction of labor tomorrow
See rheumatologist in the near future
Preconception MFM consult if/when next pregnancy desired
Early entry into prenatal care; first fetal ECHO would be at 16
weeks

## 2020-01-05 ENCOUNTER — Other Ambulatory Visit: Payer: Self-pay | Admitting: Nurse Practitioner

## 2020-01-05 DIAGNOSIS — Z6838 Body mass index (BMI) 38.0-38.9, adult: Secondary | ICD-10-CM

## 2020-01-05 NOTE — Telephone Encounter (Signed)
She needs an office visit 

## 2020-01-05 NOTE — Telephone Encounter (Signed)
Phentermine refill. 

## 2020-01-06 NOTE — Telephone Encounter (Signed)
Please schedule patient for an office visit it has been 2 months since her visit once it is scheduled I will refill her medication

## 2020-02-16 NOTE — Progress Notes (Signed)
This visit occurred during the SARS-CoV-2 public health emergency.  Safety protocols were in place, including screening questions prior to the visit, additional usage of staff PPE, and extensive cleaning of exam room while observing appropriate contact time as indicated for disinfecting solutions.  Subjective:     Patient ID: Pamela Erickson , female    DOB: January 11, 1991 , 29 y.o.   MRN: [id number]   Chief Complaint  Patient presents with  . Annual Exam    HPI  Here for HM.  She is having a cycle on the 3rd week instead of the 4th week of mini pill.  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    Wt Readings from Last 3 Encounters: 02/17/20 : 208 lb 9.6 oz (94.6 kg) 10/25/19 : 208 lb 9.6 oz (94.6 kg) 08/24/19 : 229 lb 6.4 oz (104.1 kg)  When she was taking phentermine she did notice her appetite was better.   She continues to walk at least every other day for exercise. She has been more mindful about overeating.  Has not had phentermine in at least one month.  She is drinking more water than she used to.   Sjoren's - taking plaquenil - has appt at the end of August.      Past Medical History:  Diagnosis Date  . Abnormal Pap smear    last pap 01/2012  . Anemia    During pregnancy  . Herpes   . 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 Sjogren's syndrome ([place])   . Torn ACL (anterior cruciate ligament) 2008  . Vaginal Pap smear, abnormal    May 2018; biopsy was normal      Family History  Problem Relation Age of Onset  . Hypertension Mother   . Hypertension Father   . Diabetes Maternal Aunt   . Hypertension Maternal Grandmother   . Hypertension Maternal Grandfather      Current Outpatient Medications:  .  hydroxychloroquine (PLAQUENIL) 200 MG tablet, Take 400 mg by mouth daily. Take 2 tablets daily, Disp: , Rfl:  .  valACYclovir (VALTREX) 500 MG tablet, Take 500 mg by mouth at bedtime., Disp: , Rfl:  .  phentermine 15 MG capsule, Take 1 capsule (15 mg total) by mouth  every morning., Disp: 30 capsule, Rfl: 0 .  Vitamin D, Ergocalciferol, (DRISDOL) 1.25 MG (50000 UNIT) CAPS capsule, Take 1 capsule (50,000 Units total) by mouth every 7 (seven) days., Disp: 12 capsule, Rfl: 0   Allergies  Allergen Reactions  . Ibuprofen Rash    On hands      The patient states she uses mini pill for birth control.  Patient's last menstrual period was 01/29/2020.. Negative for Dysmenorrhea and Negative for Menorrhagia. Negative for: breast discharge, breast lump(s), breast pain and breast self exam. Associated symptoms include abnormal vaginal bleeding. Pertinent negatives include abnormal bleeding (hematology), anxiety, decreased libido, depression, difficulty falling sleep, dyspareunia, history of infertility, nocturia, sexual dysfunction, sleep disturbances, urinary incontinence, urinary urgency, vaginal discharge and vaginal itching. Diet regular. The patient states her exercise level is    The patient's tobacco use is:  Social History   Tobacco Use  Smoking Status Never Smoker  Smokeless Tobacco Never Used   She has been exposed to passive smoke. The patient's alcohol use is:  Social History   Substance and Sexual Activity  Alcohol Use No   Comment: OCC   Additional information: Last pap 04/23/2019, next one scheduled for 04/22/20.    Review of  Systems  Constitutional: Negative.   HENT: Negative.   Eyes: Negative.   Respiratory: Negative.   Cardiovascular: Negative.   Gastrointestinal: Negative.   Endocrine: Negative.   Genitourinary: Negative.   Musculoskeletal: Negative.   Skin: Negative.   Allergic/Immunologic: Negative.   Neurological: Negative.   Hematological: Negative.   Psychiatric/Behavioral: Negative.      Today's Vitals   02/17/20 1433  BP: 112/70  Pulse: 96  Temp: 98.3 F (36.8 C)  TempSrc: Oral  Weight: 208 lb 9.6 oz (94.6 kg)  Height: 5' 1.4" (1.56 m)  PainSc: 0-No pain   Body mass index is 38.9 kg/m.   Objective:   Physical Exam Constitutional:      General: She is not in acute distress.    Appearance: Normal appearance. She is well-developed. She is obese.  HENT:     Head: Normocephalic and atraumatic.     Right Ear: Hearing, tympanic membrane, ear canal and external ear normal. There is no impacted cerumen.     Left Ear: Hearing, tympanic membrane, ear canal and external ear normal. There is no impacted cerumen.     Nose: Nose normal.     Mouth/Throat:     Mouth: Mucous membranes are dry.  Eyes:     General: Lids are normal.     Extraocular Movements: Extraocular movements intact.     Conjunctiva/sclera: Conjunctivae normal.     Pupils: Pupils are equal, round, and reactive to light.     Funduscopic exam:    Right eye: No papilledema.        Left eye: No papilledema.  Neck:     Thyroid: No thyroid mass.     Vascular: No carotid bruit.  Cardiovascular:     Rate and Rhythm: Normal rate and regular rhythm.     Pulses: Normal pulses.     Heart sounds: Normal heart sounds. No murmur heard.   Pulmonary:     Effort: Pulmonary effort is normal.     Breath sounds: Normal breath sounds.  Abdominal:     General: Abdomen is flat. Bowel sounds are normal. There is no distension.     Palpations: Abdomen is soft.     Tenderness: There is no abdominal tenderness.  Genitourinary:    Rectum: Guaiac result negative.  Musculoskeletal:        General: No swelling. Normal range of motion.     Cervical back: Full passive range of motion without pain, normal range of motion and neck supple.     Right lower leg: No edema.     Left lower leg: No edema.  Skin:    General: Skin is warm and dry.     Capillary Refill: Capillary refill takes less than 2 seconds.  Neurological:     General: No focal deficit present.     Mental Status: She is alert and oriented to person, place, and time.     Cranial Nerves: No cranial nerve deficit.     Sensory: No sensory deficit.  Psychiatric:        Mood and Affect:  Mood normal.        Behavior: Behavior normal.        Thought Content: Thought content normal.        Judgment: Judgment normal.         Assessment And Plan:     1. Encounter for general adult medical examination w/o abnormal findings . Behavior modifications discussed and diet history reviewed.   . Pt will continue  to exercise regularly and modify diet with low GI, plant based foods and decrease intake of processed foods.  . Recommend intake of daily multivitamin, Vitamin D, and calcium.  . Recommend for preventive screenings, as well as recommend immunizations that include influenza, TDAP - Lipid panel  2. Encounter for hepatitis C screening test for low risk patient  Will check Hepatitis C screening due to recent recommendations to screen all adults 18 years and older - Hepatitis C antibody  3. Morbid obesity (HCC) Chronic Discussed healthy diet and regular exercise options  Encouraged to exercise at least 150 minutes per week with 2 days of strength training Started her on phentermine 15 mg discussed side effects to include palpitations and dry mouth.  Return in 2 months for weight check. - CMP14+EGFR - CBC - Hemoglobin A1c - phentermine 15 MG capsule; Take 1 capsule (15 mg total) by mouth every morning.  Dispense: 30 capsule; Refill: 0  4. BMI 38.0-38.9,adult  She is encouraged to strive for BMI less than 30 to decrease cardiac risk. Advised to aim for at least 150 minutes of exercise per week. - phentermine 15 MG capsule; Take 1 capsule (15 mg total) by mouth every morning.  Dispense: 30 capsule; Refill: 0  5. Abnormal glucose  Chronic  Diet controlled   Encouraged to limit intake of sugary foods and drinks  6. Sjogren's syndrome with keratoconjunctivitis sicca ([place])  Continue follow up with Rheumatology  7. Vitamin D deficiency  Will check vitamin D level and supplement as needed.     Also encouraged to spend 15 minutes in the sun daily.  - VITAMIN D 25  Hydroxy (Vit-D Deficiency, Fractures)     Patient was given opportunity to ask questions. Patient verbalized understanding of the plan and was able to repeat key elements of the plan. All questions were answered to their satisfaction.   Minette Brine, FNP   I, Minette Brine, FNP, have reviewed all documentation for this visit. The documentation on 02/29/20 for the exam, diagnosis, procedures, and orders are all accurate and complete.  THE PATIENT IS ENCOURAGED TO PRACTICE SOCIAL DISTANCING DUE TO THE COVID-19 PANDEMIC.

## 2020-02-17 ENCOUNTER — Other Ambulatory Visit: Payer: Self-pay

## 2020-02-17 ENCOUNTER — Ambulatory Visit: Payer: Medicaid Other | Admitting: Nurse Practitioner

## 2020-02-17 ENCOUNTER — Encounter: Payer: Self-pay | Admitting: Nurse Practitioner

## 2020-02-17 VITALS — BP 112/70 | HR 96 | Temp 98.3°F | Ht 61.4 in | Wt 208.6 lb

## 2020-02-17 DIAGNOSIS — Z6838 Body mass index (BMI) 38.0-38.9, adult: Secondary | ICD-10-CM

## 2020-02-17 DIAGNOSIS — R7309 Other abnormal glucose: Secondary | ICD-10-CM | POA: Diagnosis not present

## 2020-02-17 DIAGNOSIS — E559 Vitamin D deficiency, unspecified: Secondary | ICD-10-CM

## 2020-02-17 DIAGNOSIS — Z1159 Encounter for screening for other viral diseases: Secondary | ICD-10-CM | POA: Diagnosis not present

## 2020-02-17 DIAGNOSIS — M3501 Sicca syndrome with keratoconjunctivitis: Secondary | ICD-10-CM

## 2020-02-17 DIAGNOSIS — Z0000 Encounter for general adult medical examination without abnormal findings: Secondary | ICD-10-CM | POA: Diagnosis not present

## 2020-02-17 MED ORDER — PHENTERMINE HCL 15 MG PO CAPS: 15.0000 mg | ORAL_CAPSULE | ORAL | 0 refills | Status: DC

## 2020-02-17 NOTE — Patient Instructions (Addendum)
Health Maintenance, Female Adopting a healthy lifestyle and getting preventive care are important in promoting health and wellness. Ask your health care provider about:  The right schedule for you to have regular tests and exams.  Things you can do on your own to prevent diseases and keep yourself healthy. What should I know about diet, weight, and exercise? Eat a healthy diet   Eat a diet that includes plenty of vegetables, fruits, low-fat dairy products, and lean protein.  Do not eat a lot of foods that are high in solid fats, added sugars, or sodium. Maintain a healthy weight Body mass index (BMI) is used to identify weight problems. It estimates body fat based on height and weight. Your health care provider can help determine your BMI and help you achieve or maintain a healthy weight. Get regular exercise Get regular exercise. This is one of the most important things you can do for your health. Most adults should:  Exercise for at least 150 minutes each week. The exercise should increase your heart rate and make you sweat (moderate-intensity exercise).  Do strengthening exercises at least twice a week. This is in addition to the moderate-intensity exercise.  Spend less time sitting. Even light physical activity can be beneficial. Watch cholesterol and blood lipids Have your blood tested for lipids and cholesterol at 29 years of age, then have this test every 5 years. Have your cholesterol levels checked more often if:  Your lipid or cholesterol levels are high.  You are older than 29 years of age.  You are at high risk for heart disease. What should I know about cancer screening? Depending on your health history and family history, you may need to have cancer screening at various ages. This may include screening for:  Breast cancer.  Cervical cancer.  Colorectal cancer.  Skin cancer.  Lung cancer. What should I know about heart disease, diabetes, and high blood  pressure? Blood pressure and heart disease  High blood pressure causes heart disease and increases the risk of stroke. This is more likely to develop in people who have high blood pressure readings, are of African descent, or are overweight.  Have your blood pressure checked: ? Every 3-5 years if you are 18-39 years of age. ? Every year if you are 40 years old or older. Diabetes Have regular diabetes screenings. This checks your fasting blood sugar level. Have the screening done:  Once every three years after age 40 if you are at a normal weight and have a low risk for diabetes.  More often and at a younger age if you are overweight or have a high risk for diabetes. What should I know about preventing infection? Hepatitis B If you have a higher risk for hepatitis B, you should be screened for this virus. Talk with your health care provider to find out if you are at risk for hepatitis B infection. Hepatitis C Testing is recommended for:  Everyone born from 1945 through 1965.  Anyone with known risk factors for hepatitis C. Sexually transmitted infections (STIs)  Get screened for STIs, including gonorrhea and chlamydia, if: ? You are sexually active and are younger than 29 years of age. ? You are older than 29 years of age and your health care provider tells you that you are at risk for this type of infection. ? Your sexual activity has changed since you were last screened, and you are at increased risk for chlamydia or gonorrhea. Ask your health care provider if   you are at risk.  Ask your health care provider about whether you are at high risk for HIV. Your health care provider may recommend a prescription medicine to help prevent HIV infection. If you choose to take medicine to prevent HIV, you should first get tested for HIV. You should then be tested every 3 months for as long as you are taking the medicine. Pregnancy  If you are about to stop having your period (premenopausal) and  you may become pregnant, seek counseling before you get pregnant.  Take 400 to 800 micrograms (mcg) of folic acid every day if you become pregnant.  Ask for birth control (contraception) if you want to prevent pregnancy. Osteoporosis and menopause Osteoporosis is a disease in which the bones lose minerals and strength with aging. This can result in bone fractures. If you are 26 years old or older, or if you are at risk for osteoporosis and fractures, ask your health care provider if you should:  Be screened for bone loss.  Take a calcium or vitamin D supplement to lower your risk of fractures.  Be given hormone replacement therapy (HRT) to treat symptoms of menopause. Follow these instructions at home: Lifestyle  Do not use any products that contain nicotine or tobacco, such as cigarettes, e-cigarettes, and chewing tobacco. If you need help quitting, ask your health care provider.  Do not use street drugs.  Do not share needles.  Ask your health care provider for help if you need support or information about quitting drugs. Alcohol use  Do not drink alcohol if: ? Your health care provider tells you not to drink. ? You are pregnant, may be pregnant, or are planning to become pregnant.  If you drink alcohol: ? Limit how much you use to 0-1 drink a day. ? Limit intake if you are breastfeeding.  Be aware of how much alcohol is in your drink. In the [place] one drink equals one 12 oz bottle of beer (355 mL), one 5 oz glass of wine (148 mL), or one 1 oz glass of hard liquor (44 mL). General instructions  Schedule regular health, dental, and eye exams.  Stay current with your vaccines.  Tell your health care provider if: ? You often feel depressed. ? You have ever been abused or do not feel safe at home. Summary  Adopting a healthy lifestyle and getting preventive care are important in promoting health and wellness.  Follow your health care provider's instructions about healthy  diet, exercising, and getting tested or screened for diseases.  Follow your health care provider's instructions on monitoring your cholesterol and blood pressure. This information is not intended to replace advice given to you by your health care provider. Make sure you discuss any questions you have with your health care provider. Document Revised: 06/17/2018 Document Reviewed: 06/17/2018 Elsevier Patient Education  2020 [employer].   Call when you are a week out from the end of your phentermine so I can send the upper date.

## 2020-02-18 LAB — CBC
Hematocrit: 37.2 % (ref 34.0–46.6)
Hemoglobin: 12.4 g/dL (ref 11.1–15.9)
MCH: 25.2 pg — ABNORMAL LOW (ref 26.6–33.0)
MCHC: 33.3 g/dL (ref 31.5–35.7)
MCV: 76 fL — ABNORMAL LOW (ref 79–97)
Platelets: 464 10*3/uL — ABNORMAL HIGH (ref 150–450)
RBC: 4.93 x10E6/uL (ref 3.77–5.28)
RDW: 13.5 % (ref 11.7–15.4)
WBC: 4.2 10*3/uL (ref 3.4–10.8)

## 2020-02-18 LAB — CMP14+EGFR
ALT: 12 IU/L (ref 0–32)
AST: 20 IU/L (ref 0–40)
Albumin/Globulin Ratio: 1 — ABNORMAL LOW (ref 1.2–2.2)
Albumin: 4.1 g/dL (ref 3.9–5.0)
Alkaline Phosphatase: 66 IU/L (ref 48–121)
BUN/Creatinine Ratio: 10 (ref 9–23)
BUN: 9 mg/dL (ref 6–20)
Bilirubin Total: 0.3 mg/dL (ref 0.0–1.2)
CO2: 22 mmol/L (ref 20–29)
Calcium: 8.9 mg/dL (ref 8.7–10.2)
Chloride: 104 mmol/L (ref 96–106)
Creatinine, Ser: 0.88 mg/dL (ref 0.57–1.00)
GFR calc Af Amer: 103 mL/min/{1.73_m2} (ref >59)
GFR calc non Af Amer: 89 mL/min/{1.73_m2} (ref >59)
Globulin, Total: 4.3 g/dL (ref 1.5–4.5)
Glucose: 79 mg/dL (ref 65–99)
Potassium: 4 mmol/L (ref 3.5–5.2)
Sodium: 139 mmol/L (ref 134–144)
Total Protein: 8.4 g/dL (ref 6.0–8.5)

## 2020-02-18 LAB — HEMOGLOBIN A1C
Est. average glucose Bld gHb Est-mCnc: 108 mg/dL
Hgb A1c MFr Bld: 5.4 % (ref 4.8–5.6)

## 2020-02-18 LAB — LIPID PANEL
Chol/HDL Ratio: 3.1 ratio (ref 0.0–4.4)
Cholesterol, Total: 137 mg/dL (ref 100–199)
HDL: 44 mg/dL (ref >39)
LDL Chol Calc (NIH): 79 mg/dL (ref 0–99)
Triglycerides: 68 mg/dL (ref 0–149)
VLDL Cholesterol Cal: 14 mg/dL (ref 5–40)

## 2020-02-18 LAB — VITAMIN D 25 HYDROXY (VIT D DEFICIENCY, FRACTURES): Vit D, 25-Hydroxy: 22.3 ng/mL — ABNORMAL LOW (ref 30.0–100.0)

## 2020-02-18 LAB — HEPATITIS C ANTIBODY: Hep C Virus Ab: <0.1 s/co ratio (ref 0.0–0.9)

## 2020-02-29 ENCOUNTER — Other Ambulatory Visit: Payer: Self-pay | Admitting: Nurse Practitioner

## 2020-02-29 MED ORDER — VITAMIN D (ERGOCALCIFEROL) 1.25 MG (50000 UNIT) PO CAPS: 50000.0000 [IU] | ORAL_CAPSULE | ORAL | 0 refills | Status: DC

## 2020-03-22 ENCOUNTER — Other Ambulatory Visit: Payer: Self-pay | Admitting: Nurse Practitioner

## 2020-03-22 NOTE — Telephone Encounter (Signed)
Last 02/17/20

## 2020-04-20 ENCOUNTER — Encounter: Payer: Self-pay | Admitting: Nurse Practitioner

## 2020-04-20 ENCOUNTER — Other Ambulatory Visit: Payer: Self-pay

## 2020-04-20 ENCOUNTER — Ambulatory Visit: Payer: Medicaid Other | Admitting: Nurse Practitioner

## 2020-04-20 VITALS — BP 118/76 | HR 84 | Temp 98.5°F | Ht 61.6 in | Wt 204.0 lb

## 2020-04-20 DIAGNOSIS — Z2821 Immunization not carried out because of patient refusal: Secondary | ICD-10-CM

## 2020-04-20 DIAGNOSIS — Z6835 Body mass index (BMI) 35.0-35.9, adult: Secondary | ICD-10-CM

## 2020-04-20 DIAGNOSIS — E669 Obesity, unspecified: Secondary | ICD-10-CM | POA: Diagnosis not present

## 2020-04-20 MED ORDER — PHENTERMINE HCL 37.5 MG PO CAPS: 37.5000 mg | ORAL_CAPSULE | Freq: Every morning | ORAL | 1 refills | Status: DC

## 2020-04-20 NOTE — Patient Instructions (Addendum)

## 2020-04-20 NOTE — Progress Notes (Signed)
I,Yamilka Roman [employer] as a Neurosurgeon for [employer], FNP.,have documented all relevant documentation on the behalf of Arnette Felts, FNP,as directed by  Arnette Felts, FNP while in the presence of Arnette Felts, FNP. This visit occurred during the SARS-CoV-2 public health emergency.  Safety protocols were in place, including screening questions prior to the visit, additional usage of staff PPE, and extensive cleaning of exam room while observing appropriate contact time as indicated for disinfecting solutions.  Subjective:     Patient ID: Pamela Erickson , female    DOB: [birth year] , 29 y.o.   MRN: [id number]   Chief Complaint  Patient presents with  . Weight Check    HPI  Patient here for a weight check. She is currently taking phentermine 37.5 mg, she did not do as well this past month due to her uncle passing away. She did not exercise as much.  Denies any increased heart rate.  She is trying to eat more grilled foods (chicken and salmon).  She is incorporating more vegetables.  She had been walking 2 miles at a fast pace 3-4 times a week.    Wt Readings from Last 3 Encounters: 04/20/20 : 204 lb (92.5 kg) 02/17/20 : 208 lb 9.6 oz (94.6 kg) 10/25/19 : 208 lb 9.6 oz (94.6 kg)    Past Medical History:  Diagnosis Date  . Abnormal Pap smear    last pap 01/2012  . Anemia    During pregnancy  . Herpes   . 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 Sjogren's syndrome (HCC)   . Torn ACL (anterior cruciate ligament) 2008  . Vaginal Pap smear, abnormal    May 2018; biopsy was normal     Family History  Problem Relation Age of Onset  . Hypertension Mother   . Hypertension Father   . Diabetes Maternal Aunt   . Hypertension Maternal Grandmother   . Hypertension Maternal Grandfather      Current Outpatient Medications:  .  hydroxychloroquine (PLAQUENIL) 200 MG tablet, Take 400 mg by mouth daily. Take 2 tablets daily, Disp: , Rfl:  .  phentermine 37.5 MG capsule, Take 1 capsule  (37.5 mg total) by mouth every morning., Disp: 30 capsule, Rfl: 1 .  valACYclovir (VALTREX) 500 MG tablet, Take 500 mg by mouth at bedtime., Disp: , Rfl:  .  Vitamin D, Ergocalciferol, (DRISDOL) 1.25 MG (50000 UNIT) CAPS capsule, Take 1 capsule (50,000 Units total) by mouth every 7 (seven) days., Disp: 12 capsule, Rfl: 0   Allergies  Allergen Reactions  . Ibuprofen Rash    On hands     Review of Systems  Constitutional: Negative.   Respiratory: Negative.   Cardiovascular: Negative.   Gastrointestinal: Negative.   Genitourinary: Negative.   Musculoskeletal: Negative.   Skin: Negative.   Psychiatric/Behavioral: Negative.      Today's Vitals   04/20/20 1520  BP: 118/76  Pulse: 84  Temp: 98.5 F (36.9 C)  TempSrc: Oral  Weight: 204 lb (92.5 kg)  Height: 5' 1.6" (1.565 m)  PainSc: 0-No pain   Body mass index is 37.8 kg/m.   Objective:  Physical Exam Vitals reviewed.  Constitutional:      General: She is not in acute distress.    Appearance: Normal appearance. She is well-developed. She is obese.  Cardiovascular:     Rate and Rhythm: Normal rate and regular rhythm.     Pulses: Normal pulses.     Heart sounds: Normal heart sounds.  No murmur heard.   Pulmonary:     Effort: Pulmonary effort is normal. No respiratory distress.     Breath sounds: Normal breath sounds.  Chest:     Chest wall: No tenderness.  Musculoskeletal:        General: Normal range of motion.  Skin:    General: Skin is warm and dry.     Capillary Refill: Capillary refill takes less than 2 seconds.  Neurological:     General: No focal deficit present.     Mental Status: She is alert and oriented to person, place, and time.     Cranial Nerves: No cranial nerve deficit.  Psychiatric:        Mood and Affect: Mood normal.        Behavior: Behavior normal.        Thought Content: Thought content normal.        Judgment: Judgment normal.         Assessment And Plan:     1. Obesity (BMI  35.0-39.9 without comorbidity)  Chronic ASK- Given permission to discuss weight today ASSESS - Body mass index is 37.8 kg/m., well tolerated, WAIST CIRCUMFERENCE 40.75 in ADVISE  - on risk of cardiovascular disease currently has a diagnosis of hypertension, on medications. AGREE - to continue with exercising regularly, return to office for follow up in 2 months ASSIST - continue with phentermine. She is also wearing waist beads that she feels has dropped. - phentermine 37.5 MG capsule; Take 1 capsule (37.5 mg total) by mouth every morning.  Dispense: 30 capsule; Refill: 1  2. COVID-19 vaccination declined  Declines covid 19 vaccine. Discussed risk of covid 29 and if she changes her mind about the vaccine to call the office.  Encouraged to take multivitamin, vitamin d, vitamin c and zinc to increase immune system. Aware can call office if would like to have vaccine here at office.    Patient was given opportunity to ask questions. Patient verbalized understanding of the plan and was able to repeat key elements of the plan. All questions were answered to their satisfaction.     Jeanell Sparrow, FNP, have reviewed all documentation for this visit. The documentation on 04/20/20 for the exam, diagnosis, procedures, and orders are all accurate and complete.   THE PATIENT IS ENCOURAGED TO PRACTICE SOCIAL DISTANCING DUE TO THE COVID-19 PANDEMIC.

## 2020-06-19 ENCOUNTER — Ambulatory Visit: Payer: Medicaid Other | Admitting: Nurse Practitioner

## 2020-07-18 DIAGNOSIS — R768 Other specified abnormal immunological findings in serum: Secondary | ICD-10-CM | POA: Diagnosis not present

## 2020-07-18 DIAGNOSIS — Z79899 Other long term (current) drug therapy: Secondary | ICD-10-CM | POA: Diagnosis not present

## 2020-07-18 DIAGNOSIS — M3509 Sicca syndrome with other organ involvement: Secondary | ICD-10-CM | POA: Diagnosis not present

## 2020-07-25 DIAGNOSIS — Z01419 Encounter for gynecological examination (general) (routine) without abnormal findings: Secondary | ICD-10-CM | POA: Diagnosis not present

## 2020-07-25 DIAGNOSIS — Z3040 Encounter for surveillance of contraceptives, unspecified: Secondary | ICD-10-CM | POA: Diagnosis not present

## 2020-07-25 DIAGNOSIS — Z113 Encounter for screening for infections with a predominantly sexual mode of transmission: Secondary | ICD-10-CM | POA: Diagnosis not present

## 2020-07-25 DIAGNOSIS — Z124 Encounter for screening for malignant neoplasm of cervix: Secondary | ICD-10-CM | POA: Diagnosis not present

## 2020-07-25 DIAGNOSIS — Z0000 Encounter for general adult medical examination without abnormal findings: Secondary | ICD-10-CM | POA: Diagnosis not present

## 2020-07-25 DIAGNOSIS — Z6837 Body mass index (BMI) 37.0-37.9, adult: Secondary | ICD-10-CM | POA: Diagnosis not present

## 2020-08-25 IMAGING — US US MFM OB LIMITED
1 series · 15 of 27 positions shown · non-contrast
Comparison: none

[Series 1: us mfm ob limited · 27 acquisitions, 15 frames shown]
[im 1/27]
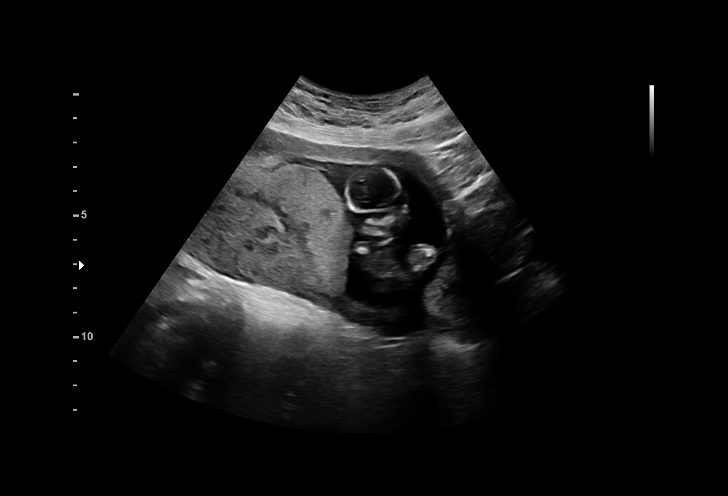
[im 3/27]
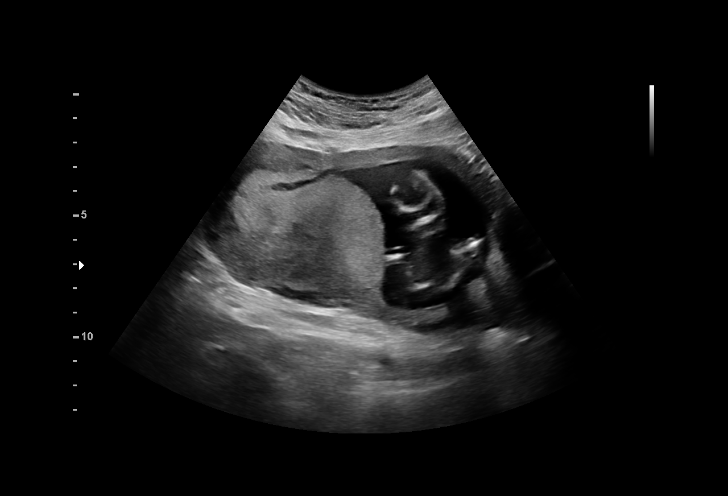
[im 5/27]
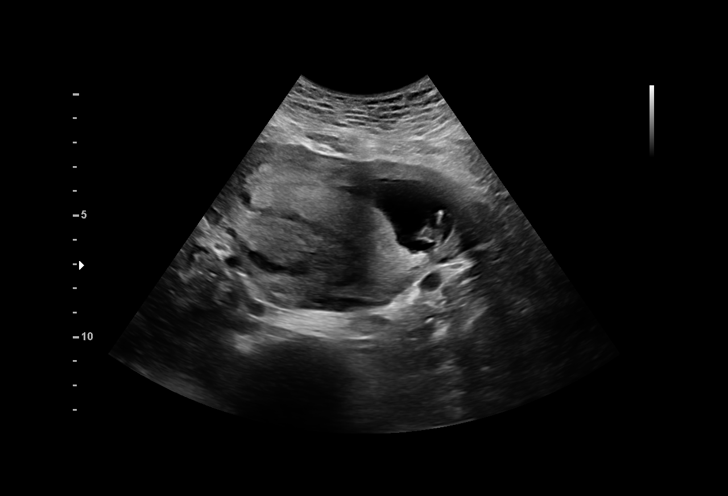
[im 7/27]
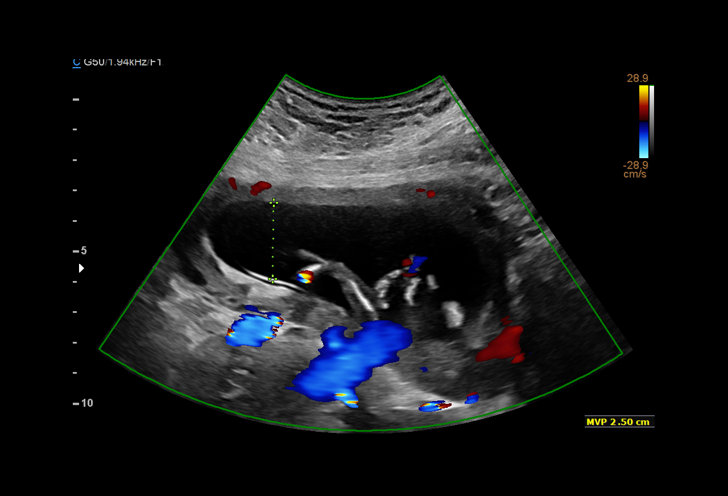
[im 9/27]
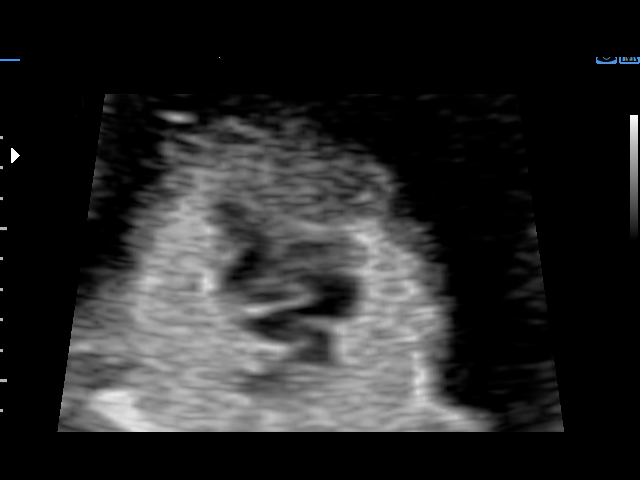
[im 10/27]
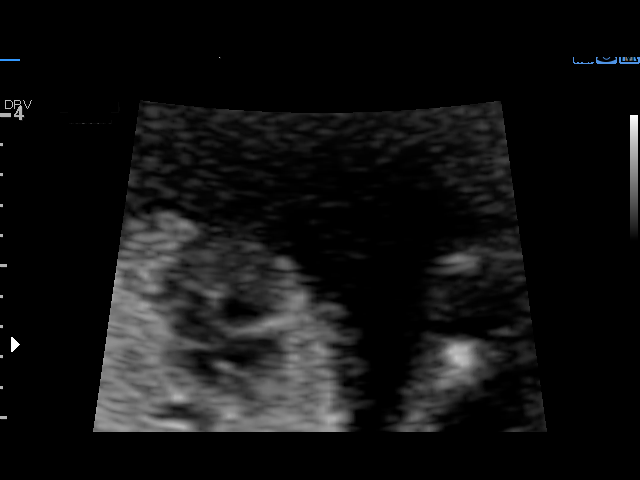
[im 12/27]
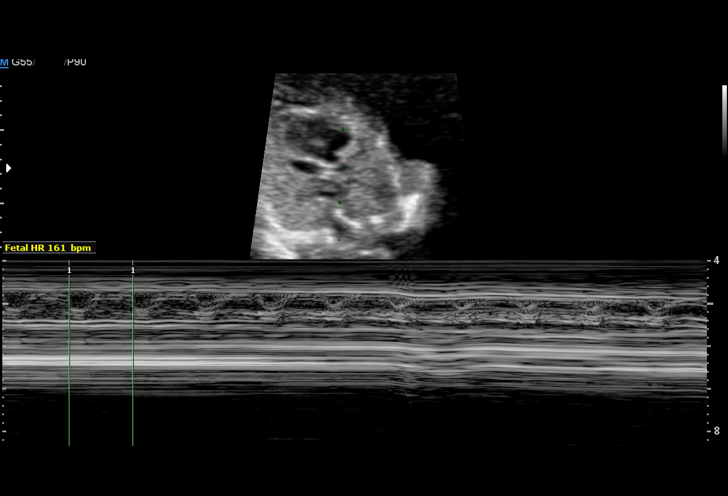
[im 14/27]
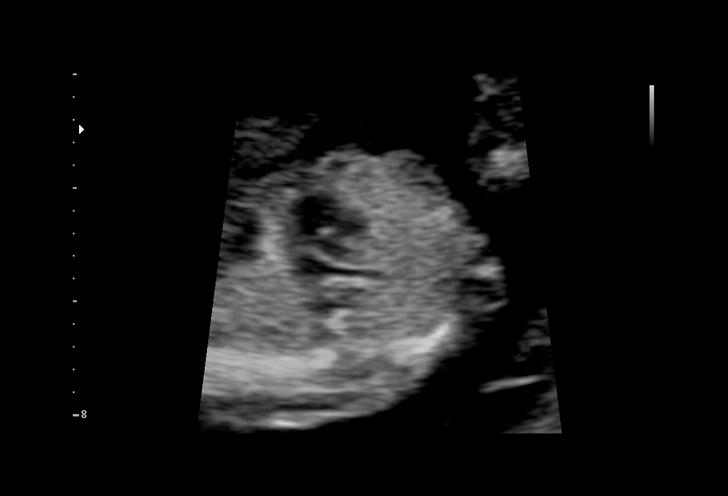
[im 16/27]
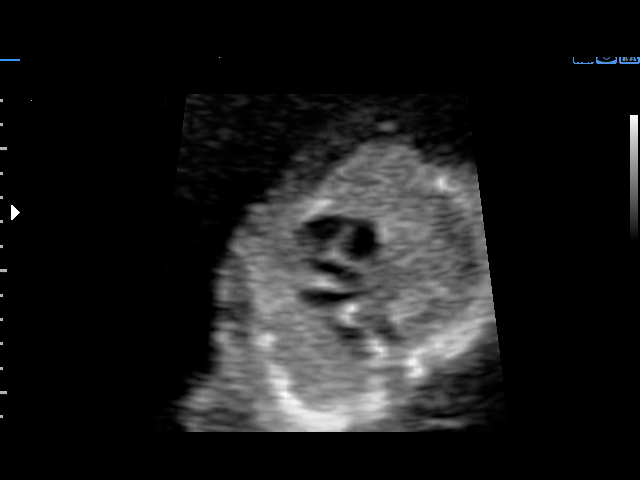
[im 18/27]
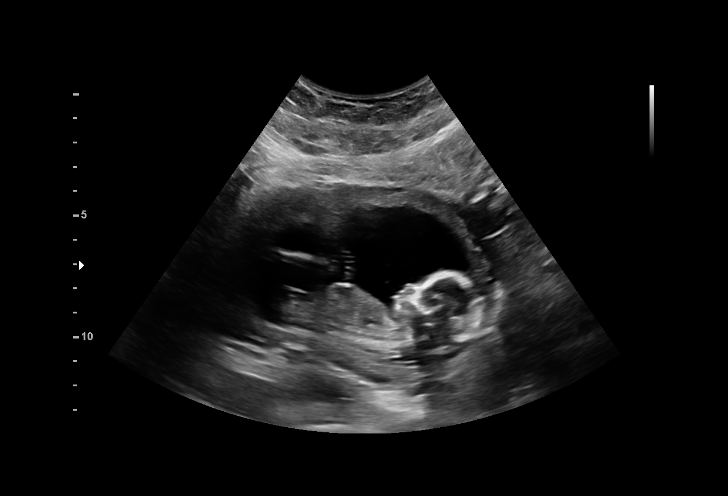
[im 19/27]
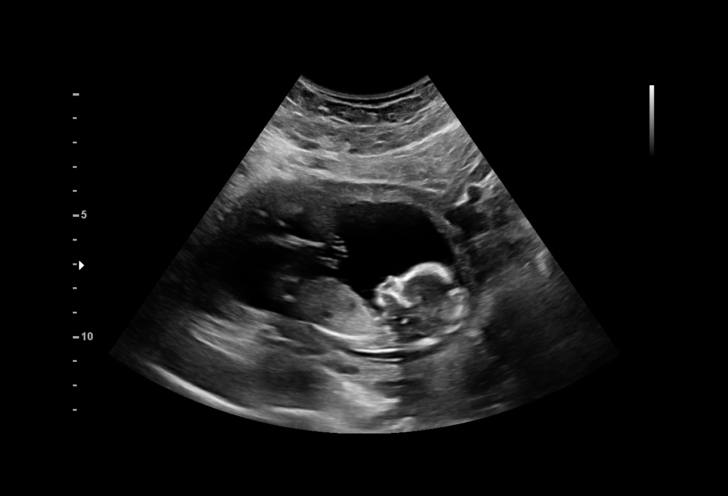
[im 21/27]
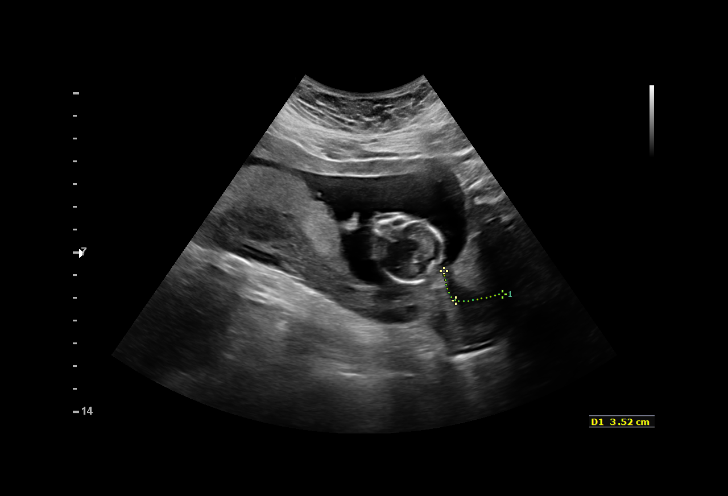
[im 23/27]
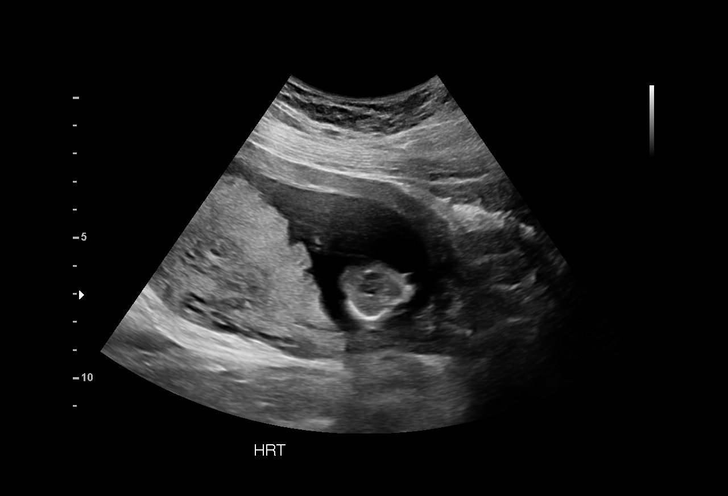
[im 25/27]
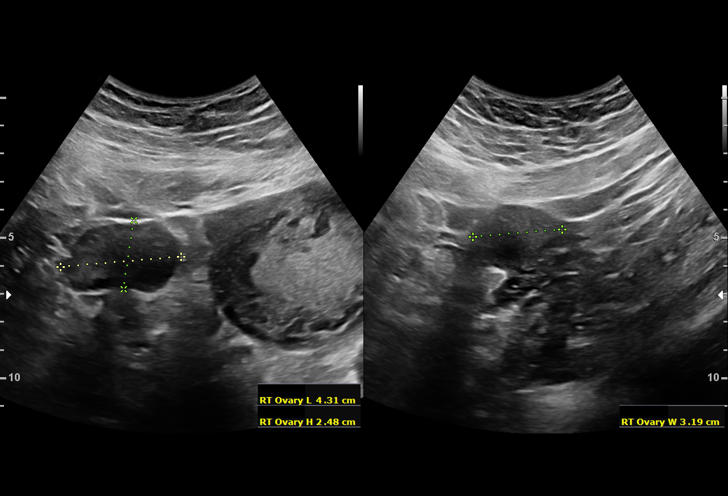
[im 27/27]
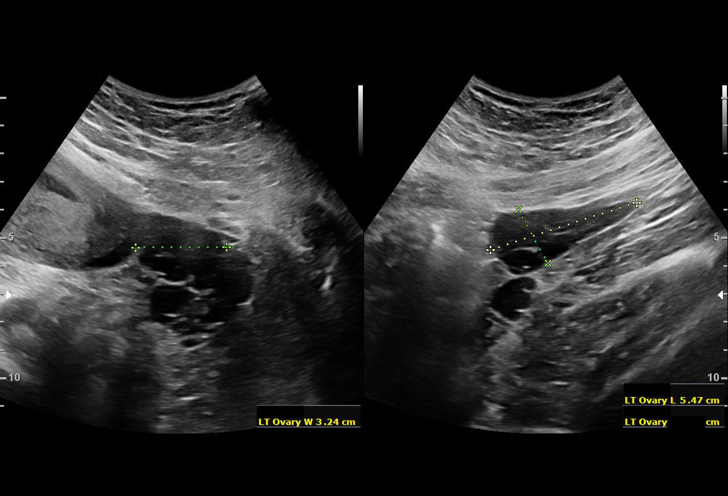

[15 of 27 positions shown; findings below may reference images not displayed]

Obstetrics &
Gynecology
[address]
[place].

1  US MFM OB LIMITED                    768[address].01     LKW TIGER

Indications

Poor obstetric history: Previous IUFD
(stillbirth) (93weeks with 3rd degree Heart
Block and Hydrops)
14 weeks gestation of pregnancy
Obesity complicating pregnancy, first
trimester
Previous cesarean delivery, antepartum
History of sickle cell trait
Medical complication of pregnancy
(Jhon Raul, +DII, SSB)
Vital Signs

BMI:
Fetal Evaluation

Num Of Fetuses:         1
Fetal Heart Rate(bpm):  158
Cardiac Activity:       Observed
Presentation:           Variable
Placenta:               Posterior

Amniotic Fluid
AFI FV:      Within normal limits
Largest Pocket(cm)
2.5
OB History

Gravidity:    3         Term:   1        Prem:   1        SAB:   0
TOP:          0       Ectopic:  0        Living: 1
Gestational Age

LMP:           14w 3d        Date:  12/13/17                 EDD:   09/19/18
Best:          14w 3d     Det. By:  LMP  (12/13/17)          EDD:   09/19/18
Cervix Uterus Adnexa

Cervix
Length:           3.52  cm.
Normal appearance by transabdominal scan.

Uterus
No abnormality visualized.

Left Ovary
Size(cm)       5.5  x   2.2    x  3.2       Vol(ml):
Within normal limits.

Right Ovary
Size(cm)       4.3  x   2.5    x  3.2       Vol(ml):
Within normal limits.
Comments

Comments: U/S images reviewed. Findings reviewed with
patient.   No evidence of a fetal heart rate disturbance in seen
on U/S today.
Questions answered.
10 minutes spent face to face with patient.
Recommendations: 1) Weekly limited scan for heart block
beginning @ 16 weeks 3) Detailed anatomic survey @ 18-20
weeks 4) Serial*U/S every 4 weeks for fetal growth 5) Close
A-P surveillance in the third trimester
Recommendations

1) Weekly limited scan for heart block beginning @ 16 weeks
3) Detailed anatomic survey @ 18-20 weeks 4) Serial*U/S
every 4 weeks for fetal growth 5) Close A-P surveillance in
the third trimester

## 2020-09-20 DIAGNOSIS — Z79899 Other long term (current) drug therapy: Secondary | ICD-10-CM | POA: Diagnosis not present

## 2020-09-20 DIAGNOSIS — R768 Other specified abnormal immunological findings in serum: Secondary | ICD-10-CM | POA: Diagnosis not present

## 2020-09-20 DIAGNOSIS — M3509 Sicca syndrome with other organ involvement: Secondary | ICD-10-CM | POA: Diagnosis not present

## 2020-10-23 DIAGNOSIS — L700 Acne vulgaris: Secondary | ICD-10-CM | POA: Diagnosis not present

## 2020-10-23 DIAGNOSIS — Z3040 Encounter for surveillance of contraceptives, unspecified: Secondary | ICD-10-CM | POA: Diagnosis not present

## 2020-10-24 ENCOUNTER — Other Ambulatory Visit: Payer: Self-pay

## 2020-10-24 ENCOUNTER — Ambulatory Visit: Payer: Medicaid Other | Admitting: Nurse Practitioner

## 2020-10-24 ENCOUNTER — Encounter: Payer: Self-pay | Admitting: Nurse Practitioner

## 2020-10-24 VITALS — BP 116/74 | HR 68 | Temp 98.2°F | Ht 61.6 in | Wt 206.0 lb

## 2020-10-24 DIAGNOSIS — E559 Vitamin D deficiency, unspecified: Secondary | ICD-10-CM

## 2020-10-24 DIAGNOSIS — Z6838 Body mass index (BMI) 38.0-38.9, adult: Secondary | ICD-10-CM

## 2020-10-24 DIAGNOSIS — E669 Obesity, unspecified: Secondary | ICD-10-CM

## 2020-10-24 MED ORDER — PHENTERMINE HCL 15 MG PO CAPS: 15.0000 mg | ORAL_CAPSULE | ORAL | 0 refills | Status: DC

## 2020-10-24 NOTE — Progress Notes (Signed)
Tomasa Hose as a scribe for Arnette Felts, FNP.,have documented all relevant documentation on the behalf of Arnette Felts, FNP,as directed by  Arnette Felts, FNP while in the presence of Arnette Felts, FNP. This visit occurred during the SARS-CoV-2 public health emergency.  Safety protocols were in place, including screening questions prior to the visit, additional usage of staff PPE, and extensive cleaning of exam room while observing appropriate contact time as indicated for disinfecting solutions.  Subjective:     Patient ID: Pamela Erickson , female    DOB: November 01, 1990 , 30 y.o.   MRN: [id number]   Chief Complaint  Patient presents with  . Weight Check    HPI  Patient here for a weight check. She is currently taking phentermine 37.5 mg, she is compliant with all meds and has no other complaints at this time   Wt Readings from Last 3 Encounters: 10/24/20 : 206 lb (93.4 kg) 04/20/20 : 204 lb (92.5 kg) 02/17/20 : 208 lb 9.6 oz (94.6 kg)  She has been out of her phentermine for several months.  She is exercising, she has joined [employer] for the last month. She has seen some inches to decrease.  When she started there her weight went back up to 212 lb a month ago.       Past Medical History:  Diagnosis Date  . Abnormal Pap smear    last pap 01/2012  . Anemia    During pregnancy  . Herpes   . 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 Sjogren's syndrome (HCC)   . Torn ACL (anterior cruciate ligament) 2008  . Vaginal Pap smear, abnormal    May 2018; biopsy was normal     Family History  Problem Relation Age of Onset  . Hypertension Mother   . Hypertension Father   . Diabetes Maternal Aunt   . Hypertension Maternal Grandmother   . Hypertension Maternal Grandfather      Current Outpatient Medications:  .  hydroxychloroquine (PLAQUENIL) 200 MG tablet, Take 400 mg by mouth daily. Take 2 tablets daily, Disp: , Rfl:  .  phentermine 15 MG capsule, Take 1 capsule  (15 mg total) by mouth every morning., Disp: 30 capsule, Rfl: 0 .  valACYclovir (VALTREX) 500 MG tablet, Take 500 mg by mouth at bedtime., Disp: , Rfl:  .  Vitamin D, Ergocalciferol, (DRISDOL) 1.25 MG (50000 UNIT) CAPS capsule, Take 1 capsule (50,000 Units total) by mouth every 7 (seven) days., Disp: 12 capsule, Rfl: 0   Allergies  Allergen Reactions  . Ibuprofen Rash    On hands     Review of Systems  Constitutional: Negative.  Negative for fatigue.  HENT: Negative.   Respiratory: Negative.   Cardiovascular: Negative.  Negative for chest pain, palpitations and leg swelling.  Endocrine: Negative for polydipsia, polyphagia and polyuria.  Musculoskeletal: Negative.   Skin: Negative.   Neurological: Negative for dizziness and headaches.  Psychiatric/Behavioral: Negative.      Today's Vitals   10/24/20 1634  BP: 116/74  Pulse: 68  Temp: 98.2 F (36.8 C)  TempSrc: Oral  Weight: 206 lb (93.4 kg)  Height: 5' 1.6" (1.565 m)   Body mass index is 38.17 kg/m.  Wt Readings from Last 3 Encounters:  10/24/20 206 lb (93.4 kg)  04/20/20 204 lb (92.5 kg)  02/17/20 208 lb 9.6 oz (94.6 kg)   Objective:  Physical Exam Vitals reviewed.  Constitutional:      General: She is  not in acute distress.    Appearance: Normal appearance. She is well-developed. She is obese.  Cardiovascular:     Rate and Rhythm: Normal rate and regular rhythm.     Pulses: Normal pulses.     Heart sounds: Normal heart sounds. No murmur heard.   Pulmonary:     Effort: Pulmonary effort is normal. No respiratory distress.     Breath sounds: Normal breath sounds.  Chest:     Chest wall: No tenderness.  Musculoskeletal:        General: Normal range of motion.  Skin:    General: Skin is warm and dry.     Capillary Refill: Capillary refill takes less than 2 seconds.  Neurological:     General: No focal deficit present.     Mental Status: She is alert and oriented to person, place, and time.     Cranial  Nerves: No cranial nerve deficit.  Psychiatric:        Mood and Affect: Mood normal.        Behavior: Behavior normal.        Thought Content: Thought content normal.        Judgment: Judgment normal.         Assessment And Plan:     1. Obesity (BMI 38.17-39.9 without comorbidity)  She continues to have struggles with losing weight  Will start her on phentermine 15mg  discussed side effects if has palpitations or headache persistent to contact office . Goal to exercise 150 minutes per week with at least 2 days of strength training . Encouraged to park further when at the store, take stairs instead of elevators and to walk in place during commercials. . Increase water intake to at least one gallon of water daily. - phentermine 15 MG capsule; Take 1 capsule (15 mg total) by mouth every morning.  Dispense: 30 capsule; Refill: 0  2. Vitamin D deficiency  Will check vitamin D level and supplement as needed.     Also encouraged to spend 15 minutes in the sun daily.  - Vitamin D (25 hydroxy)   She is encouraged to strive for BMI less than 30 to decrease cardiac risk. Advised to aim for at least 150 minutes of exercise per week.  Patient was given opportunity to ask questions. Patient verbalized understanding of the plan and was able to repeat key elements of the plan. All questions were answered to their satisfaction.  , FNP   I, Arnette Felts, FNP, have reviewed all documentation for this visit. The documentation on 10/24/20 for the exam, diagnosis, procedures, and orders are all accurate and complete.   IF YOU HAVE BEEN REFERRED TO A SPECIALIST, IT MAY TAKE 1-2 WEEKS TO SCHEDULE/PROCESS THE REFERRAL. IF YOU HAVE NOT HEARD FROM US/SPECIALIST IN TWO WEEKS, PLEASE GIVE 10/26/20 A CALL AT [phone].   THE PATIENT IS ENCOURAGED TO PRACTICE SOCIAL DISTANCING DUE TO THE COVID-19 PANDEMIC.

## 2020-10-24 NOTE — Patient Instructions (Signed)

## 2020-11-03 ENCOUNTER — Other Ambulatory Visit: Payer: Medicaid Other

## 2020-11-03 ENCOUNTER — Other Ambulatory Visit: Payer: Self-pay

## 2020-11-03 DIAGNOSIS — E559 Vitamin D deficiency, unspecified: Secondary | ICD-10-CM | POA: Diagnosis not present

## 2020-11-04 LAB — VITAMIN D 25 HYDROXY (VIT D DEFICIENCY, FRACTURES): Vit D, 25-Hydroxy: 27.1 ng/mL — ABNORMAL LOW (ref 30.0–100.0)

## 2020-11-04 MED ORDER — VITAMIN D (ERGOCALCIFEROL) 1.25 MG (50000 UNIT) PO CAPS: 50000.0000 [IU] | ORAL_CAPSULE | ORAL | 0 refills | Status: DC

## 2020-11-21 ENCOUNTER — Other Ambulatory Visit: Payer: Self-pay | Admitting: Nurse Practitioner

## 2020-11-21 NOTE — Telephone Encounter (Signed)
Phentermine refill. 

## 2021-01-01 ENCOUNTER — Ambulatory Visit: Payer: Medicaid Other | Admitting: Nurse Practitioner

## 2021-01-01 ENCOUNTER — Other Ambulatory Visit: Payer: Self-pay

## 2021-01-01 ENCOUNTER — Encounter: Payer: Self-pay | Admitting: Nurse Practitioner

## 2021-01-01 VITALS — BP 116/72 | HR 84 | Temp 98.7°F | Ht 61.6 in | Wt 195.4 lb

## 2021-01-01 DIAGNOSIS — Z6836 Body mass index (BMI) 36.0-36.9, adult: Secondary | ICD-10-CM

## 2021-01-01 DIAGNOSIS — E6609 Other obesity due to excess calories: Secondary | ICD-10-CM | POA: Diagnosis not present

## 2021-01-01 MED ORDER — PHENTERMINE HCL 37.5 MG PO CAPS: 37.5000 mg | ORAL_CAPSULE | Freq: Every morning | ORAL | 1 refills | Status: DC

## 2021-01-01 NOTE — Patient Instructions (Signed)
Exercising to Lose Weight Exercise is structured, repetitive physical activity to improve fitness and health. Getting regular exercise is important for everyone. It is especially important if you are overweight. Being overweight increases your risk of heart disease, stroke, diabetes, high blood pressure, and several types of cancer.Reducing your calorie intake and exercising can help you lose weight. Exercise is usually categorized as moderate or vigorous intensity. To lose weight, most people need to do a certain amount of moderate-intensity orvigorous-intensity exercise each week. Moderate-intensity exercise  Moderate-intensity exercise is any activity that gets you moving enough to burn at least three times more energy (calories) than if you were sitting. Examples of moderate exercise include: Walking a mile in 15 minutes. Doing light yard work. Biking at an easy pace. Most people should get at least 150 minutes (2 hours and 30 minutes) a week ofmoderate-intensity exercise to maintain their body weight. Vigorous-intensity exercise Vigorous-intensity exercise is any activity that gets you moving enough to burn at least six times more calories than if you were sitting. When you exercise at this intensity, you should be working hard enough that you are not able tocarry on a conversation. Examples of vigorous exercise include: Running. Playing a team sport, such as football, basketball, and soccer. Jumping rope. Most people should get at least 75 minutes (1 hour and 15 minutes) a week ofvigorous-intensity exercise to maintain their body weight. How can exercise affect me? When you exercise enough to burn more calories than you eat, you lose weight. Exercise also reduces body fat and builds muscle. The more muscle you have, the more calories you burn. Exercise also: Improves mood. Reduces stress and tension. Improves your overall fitness, flexibility, and endurance. Increases bone strength. The  amount of exercise you need to lose weight depends on: Your age. The type of exercise. Any health conditions you have. Your overall physical ability. Talk to your health care provider about how much exercise you need and whattypes of activities are safe for you. What actions can I take to lose weight? Nutrition  Make changes to your diet as told by your health care provider or diet and nutrition specialist (dietitian). This may include: Eating fewer calories. Eating more protein. Eating less unhealthy fats. Eating a diet that includes fresh fruits and vegetables, whole grains, low-fat dairy products, and lean protein. Avoiding foods with added fat, salt, and sugar. Drink plenty of water while you exercise to prevent dehydration or heat stroke.  Activity Choose an activity that you enjoy and set realistic goals. Your health care provider can help you make an exercise plan that works for you. Exercise at a moderate or vigorous intensity most days of the week. The intensity of exercise may vary from person to person. You can tell how intense a workout is for you by paying attention to your breathing and heartbeat. Most people will notice their breathing and heartbeat get faster with more intense exercise. Do resistance training twice each week, such as: Push-ups. Sit-ups. Lifting weights. Using resistance bands. Getting short amounts of exercise can be just as helpful as long structured periods of exercise. If you have trouble finding time to exercise, try to include exercise in your daily routine. Get up, stretch, and walk around every 30 minutes throughout the day. Go for a walk during your lunch break. Park your car farther away from your destination. If you take public transportation, get off one stop early and walk the rest of the way. Make phone calls while standing up and   walking around. Take the stairs instead of elevators or escalators. Wear comfortable clothes and shoes with  good support. Do not exercise so much that you hurt yourself, feel dizzy, or get very short of breath. Where to find more information [place] Department of Health and Human Services: www.hhs.gov Centers for Disease Control and Prevention (CDC): www.cdc.gov Contact a health care provider: Before starting a new exercise program. If you have questions or concerns about your weight. If you have a medical problem that keeps you from exercising. Get help right away if you have any of the following while exercising: Injury. Dizziness. Difficulty breathing or shortness of breath that does not go away when you stop exercising. Chest pain. Rapid heartbeat. Summary Being overweight increases your risk of heart disease, stroke, diabetes, high blood pressure, and several types of cancer. Losing weight happens when you burn more calories than you eat. Reducing the amount of calories you eat in addition to getting regular moderate or vigorous exercise each week helps you lose weight. This information is not intended to replace advice given to you by your health care provider. Make sure you discuss any questions you have with your healthcare provider. Document Revised: 10/04/2019 Document Reviewed: 10/21/2019 Elsevier Patient Education  2022 Elsevier Inc.  

## 2021-01-01 NOTE — Progress Notes (Signed)
I,Yamilka Roman [employer] as a Neurosurgeon for [employer], FNP.,have documented all relevant documentation on the behalf of Arnette Felts, FNP,as directed by  Arnette Felts, FNP while in the presence of Arnette Felts, FNP.  This visit occurred during the SARS-CoV-2 public health emergency.  Safety protocols were in place, including screening questions prior to the visit, additional usage of staff PPE, and extensive cleaning of exam room while observing appropriate contact time as indicated for disinfecting solutions.  Subjective:     Patient ID: Pamela Erickson , female    DOB: 03/02/1991 , 30 y.o.   MRN: [id number]   Chief Complaint  Patient presents with   Weight Check    HPI  Patient presents today for a weight check. She is going to [employer] in the mornings 6 days a week and she is walking 4-5 days a week. She has been given a list of foods she should be eating.   Wt Readings from Last 3 Encounters: 01/01/21 : 195 lb 6.4 oz (88.6 kg) 10/24/20 : 206 lb (93.4 kg) 04/20/20 : 204 lb (92.5 kg)      Past Medical History:  Diagnosis Date   Abnormal Pap smear    last pap 01/2012   Anemia    During pregnancy   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   Sjogren's syndrome (HCC)    Torn ACL (anterior cruciate ligament) 2008   Vaginal Pap smear, abnormal    May 2018; biopsy was normal     Family History  Problem Relation Age of Onset   Hypertension Mother    Hypertension Father    Diabetes Maternal Aunt    Hypertension Maternal Grandmother    Hypertension Maternal Grandfather      Current Outpatient Medications:    hydroxychloroquine (PLAQUENIL) 200 MG tablet, Take 400 mg by mouth daily. Take 2 tablets daily, Disp: , Rfl:    valACYclovir (VALTREX) 500 MG tablet, Take 500 mg by mouth at bedtime., Disp: , Rfl:    Vitamin D, Ergocalciferol, (DRISDOL) 1.25 MG (50000 UNIT) CAPS capsule, Take 1 capsule (50,000 Units total) by mouth every 7 (seven) days., Disp: 12  capsule, Rfl: 0   phentermine 37.5 MG capsule, Take 1 capsule (37.5 mg total) by mouth every morning., Disp: 30 capsule, Rfl: 1   Allergies  Allergen Reactions   Ibuprofen Rash    On hands     Review of Systems  Constitutional: Negative.   Eyes: Negative.   Musculoskeletal: Negative.   Skin: Negative.   Psychiatric/Behavioral: Negative.      Today's Vitals   01/01/21 1621  BP: 116/72  Pulse: 84  Temp: 98.7 F (37.1 C)  Weight: 195 lb 6.4 oz (88.6 kg)  Height: 5' 1.6" (1.565 m)  PainSc: 0-No pain   Body mass index is 36.2 kg/m.   Objective:  Physical Exam Vitals reviewed.  Constitutional:      General: She is not in acute distress.    Appearance: Normal appearance. She is well-developed. She is obese.  Cardiovascular:     Rate and Rhythm: Normal rate and regular rhythm.     Pulses: Normal pulses.     Heart sounds: Normal heart sounds. No murmur heard. Pulmonary:     Effort: Pulmonary effort is normal. No respiratory distress.     Breath sounds: Normal breath sounds. No wheezing.  Chest:     Chest wall: No tenderness.  Musculoskeletal:  General: Normal range of motion.  Skin:    General: Skin is warm and dry.     Capillary Refill: Capillary refill takes less than 2 seconds.  Neurological:     General: No focal deficit present.     Mental Status: She is alert and oriented to person, place, and time.     Cranial Nerves: No cranial nerve deficit.     Motor: No weakness.  Psychiatric:        Mood and Affect: Mood normal.        Behavior: Behavior normal.        Thought Content: Thought content normal.        Judgment: Judgment normal.        Assessment And Plan:     1. Class 2 obesity due to excess calories with body mass index (BMI) of 36.0 to 36.9 in adult, unspecified whether serious comorbidity present Comments: Congratulated on her 9lb weight loss continue with phentermine, tolerating well - phentermine 37.5 MG capsule; Take 1 capsule (37.5 mg  total) by mouth every morning.  Dispense: 30 capsule; Refill: 1    Patient was given opportunity to ask questions. Patient verbalized understanding of the plan and was able to repeat key elements of the plan. All questions were answered to their satisfaction.  Arnette Felts, FNP   I, Arnette Felts, FNP, have reviewed all documentation for this visit. The documentation on 01/31/21 for the exam, diagnosis, procedures, and orders are all accurate and complete.   IF YOU HAVE BEEN REFERRED TO A SPECIALIST, IT MAY TAKE 1-2 WEEKS TO SCHEDULE/PROCESS THE REFERRAL. IF YOU HAVE NOT HEARD FROM US/SPECIALIST IN TWO WEEKS, PLEASE GIVE [place] A CALL AT [phone].   THE PATIENT IS ENCOURAGED TO PRACTICE SOCIAL DISTANCING DUE TO THE COVID-19 PANDEMIC.

## 2021-01-05 DIAGNOSIS — L810 Postinflammatory hyperpigmentation: Secondary | ICD-10-CM | POA: Diagnosis not present

## 2021-01-05 DIAGNOSIS — L818 Other specified disorders of pigmentation: Secondary | ICD-10-CM | POA: Diagnosis not present

## 2021-01-05 DIAGNOSIS — L700 Acne vulgaris: Secondary | ICD-10-CM | POA: Diagnosis not present

## 2021-01-05 DIAGNOSIS — D2372 Other benign neoplasm of skin of left lower limb, including hip: Secondary | ICD-10-CM | POA: Diagnosis not present

## 2021-02-22 ENCOUNTER — Encounter: Payer: Medicaid Other | Admitting: Nurse Practitioner

## 2021-04-18 DIAGNOSIS — L700 Acne vulgaris: Secondary | ICD-10-CM | POA: Diagnosis not present

## 2021-06-11 ENCOUNTER — Other Ambulatory Visit: Payer: Self-pay

## 2021-06-11 ENCOUNTER — Ambulatory Visit: Payer: Medicaid Other | Admitting: Nurse Practitioner

## 2021-06-11 ENCOUNTER — Encounter: Payer: Self-pay | Admitting: Nurse Practitioner

## 2021-06-11 VITALS — BP 122/80 | HR 76 | Temp 98.7°F | Ht 61.6 in | Wt 199.6 lb

## 2021-06-11 DIAGNOSIS — Z6836 Body mass index (BMI) 36.0-36.9, adult: Secondary | ICD-10-CM

## 2021-06-11 DIAGNOSIS — E6609 Other obesity due to excess calories: Secondary | ICD-10-CM | POA: Diagnosis not present

## 2021-06-11 DIAGNOSIS — Z2821 Immunization not carried out because of patient refusal: Secondary | ICD-10-CM | POA: Diagnosis not present

## 2021-06-11 MED ORDER — PHENTERMINE HCL 15 MG PO CAPS: 15.0000 mg | ORAL_CAPSULE | ORAL | 1 refills | Status: DC

## 2021-06-11 NOTE — Patient Instructions (Signed)

## 2021-06-11 NOTE — Progress Notes (Signed)
Claudell Kyle [place],acting as a Neurosurgeon for [employer], FNP.,have documented all relevant documentation on the behalf of Arnette Felts, FNP,as directed by  Arnette Felts, FNP while in the presence of Arnette Felts, FNP.   This visit occurred during the SARS-CoV-2 public health emergency.  Safety protocols were in place, including screening questions prior to the visit, additional usage of staff PPE, and extensive cleaning of exam room while observing appropriate contact time as indicated for disinfecting solutions.  Subjective:     Patient ID: Pamela Erickson , female    DOB: 1990/08/29 , 30 y.o.   MRN: [id number]   Chief Complaint  Patient presents with   Weight Check    HPI  Patient presents today for a weight check. Patient stated she was taken phentermine but she has not had any since July. She reported she tolerated it well. She continues to exercise with [employer]. She has been trying to do better with water. She feels she drinks empty calories with tea. She has been trying to do more baked foods, but on the weekends is her downfall.   Wt Readings from Last 3 Encounters: 06/11/21 : 199 lb 9.6 oz (90.5 kg) 01/01/21 : 195 lb 6.4 oz (88.6 kg) 10/24/20 : 206 lb (93.4 kg)      Past Medical History:  Diagnosis Date   Abnormal Pap smear    last pap 01/2012   Anemia    During pregnancy   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   Sjogren's syndrome (HCC)    Torn ACL (anterior cruciate ligament) 2008   Vaginal Pap smear, abnormal    May 2018; biopsy was normal     Family History  Problem Relation Age of Onset   Hypertension Mother    Hypertension Father    Diabetes Maternal Aunt    Hypertension Maternal Grandmother    Hypertension Maternal Grandfather      Current Outpatient Medications:    hydroxychloroquine (PLAQUENIL) 200 MG tablet, Take 400 mg by mouth daily. Take 2 tablets daily, Disp: , Rfl:    phentermine 15 MG capsule, Take 1 capsule (15 mg total)  by mouth every morning., Disp: 30 capsule, Rfl: 1   valACYclovir (VALTREX) 500 MG tablet, Take 500 mg by mouth at bedtime., Disp: , Rfl:    Vitamin D, Ergocalciferol, (DRISDOL) 1.25 MG (50000 UNIT) CAPS capsule, Take 1 capsule (50,000 Units total) by mouth every 7 (seven) days., Disp: 12 capsule, Rfl: 0   Allergies  Allergen Reactions   Ibuprofen Rash    On hands     Review of Systems  Constitutional: Negative.   Respiratory: Negative.    Cardiovascular: Negative.   Neurological:  Negative for dizziness and headaches.  Psychiatric/Behavioral: Negative.      Today's Vitals   06/11/21 1112  BP: 122/80  Pulse: 76  Temp: 98.7 F (37.1 C)  Weight: 199 lb 9.6 oz (90.5 kg)  Height: 5' 1.6" (1.565 m)  PainSc: 0-No pain   Body mass index is 36.98 kg/m.   Objective:  Physical Exam Vitals reviewed.  Constitutional:      General: She is not in acute distress.    Appearance: Normal appearance. She is well-developed. She is obese.  Cardiovascular:     Rate and Rhythm: Normal rate and regular rhythm.     Pulses: Normal pulses.     Heart sounds: Normal heart sounds. No murmur heard. Pulmonary:     Effort: Pulmonary  effort is normal. No respiratory distress.     Breath sounds: Normal breath sounds. No wheezing.  Chest:     Chest wall: No tenderness.  Skin:    General: Skin is warm and dry.     Capillary Refill: Capillary refill takes less than 2 seconds.  Neurological:     General: No focal deficit present.     Mental Status: She is alert and oriented to person, place, and time.     Cranial Nerves: No cranial nerve deficit.     Motor: No weakness.  Psychiatric:        Mood and Affect: Mood normal.        Behavior: Behavior normal.        Thought Content: Thought content normal.        Judgment: Judgment normal.        Assessment And Plan:     1. Class 2 obesity due to excess calories with body mass index (BMI) of 36.0 to 36.9 in adult, unspecified whether serious  comorbidity present I have discussed with her the risk for resistant weight loss, she will be restarted on phentermine as a jump start, it able to tolerate the 15 mg she will call for increased dose of 37.5 mg after the first month. She is encouraged to limit her intake of sugary drinks and to continue with regular exercise.  - phentermine 15 MG capsule; Take 1 capsule (15 mg total) by mouth every morning.  Dispense: 30 capsule; Refill: 1  2. Influenza vaccination declined Patient declined influenza vaccination at this time. Patient is aware that influenza vaccine prevents illness in 70% of healthy people, and reduces hospitalizations to 30-70% in elderly. This vaccine is recommended annually. Pt is willing to accept risk associated with refusing vaccination.  3. COVID-19 vaccination declined  Declines covid 19 vaccine. Discussed risk of covid 75 and if she changes her mind about the vaccine to call the office.  Encouraged to take multivitamin, vitamin d, vitamin c and zinc to increase immune system. Aware can call office if would like to have vaccine here at office.    Patient was given opportunity to ask questions. Patient verbalized understanding of the plan and was able to repeat key elements of the plan. All questions were answered to their satisfaction.  Arnette Felts, FNP   I, Arnette Felts, FNP, have reviewed all documentation for this visit. The documentation on 06/11/21 for the exam, diagnosis, procedures, and orders are all accurate and complete.   IF YOU HAVE BEEN REFERRED TO A SPECIALIST, IT MAY TAKE 1-2 WEEKS TO SCHEDULE/PROCESS THE REFERRAL. IF YOU HAVE NOT HEARD FROM US/SPECIALIST IN TWO WEEKS, PLEASE GIVE [place] A CALL AT [phone].   THE PATIENT IS ENCOURAGED TO PRACTICE SOCIAL DISTANCING DUE TO THE COVID-19 PANDEMIC.

## 2021-07-18 ENCOUNTER — Encounter: Payer: Self-pay | Admitting: Nurse Practitioner

## 2021-07-18 ENCOUNTER — Other Ambulatory Visit: Payer: Self-pay | Admitting: Nurse Practitioner

## 2021-07-18 DIAGNOSIS — Z79899 Other long term (current) drug therapy: Secondary | ICD-10-CM | POA: Diagnosis not present

## 2021-07-18 DIAGNOSIS — Z6836 Body mass index (BMI) 36.0-36.9, adult: Secondary | ICD-10-CM

## 2021-07-18 DIAGNOSIS — M3509 Sicca syndrome with other organ involvement: Secondary | ICD-10-CM | POA: Diagnosis not present

## 2021-07-18 MED ORDER — PHENTERMINE HCL 37.5 MG PO CAPS: 37.5000 mg | ORAL_CAPSULE | ORAL | 1 refills | Status: DC

## 2021-08-21 ENCOUNTER — Ambulatory Visit: Payer: Medicaid Other | Admitting: Nurse Practitioner

## 2021-09-10 ENCOUNTER — Other Ambulatory Visit: Payer: Self-pay

## 2021-09-10 ENCOUNTER — Encounter: Payer: Self-pay | Admitting: Nurse Practitioner

## 2021-09-10 ENCOUNTER — Ambulatory Visit: Payer: Medicaid Other | Admitting: Nurse Practitioner

## 2021-09-10 VITALS — BP 112/70 | HR 88 | Temp 98.7°F | Ht 61.6 in | Wt 196.8 lb

## 2021-09-10 DIAGNOSIS — E6609 Other obesity due to excess calories: Secondary | ICD-10-CM | POA: Diagnosis not present

## 2021-09-10 DIAGNOSIS — Z6836 Body mass index (BMI) 36.0-36.9, adult: Secondary | ICD-10-CM | POA: Diagnosis not present

## 2021-09-10 MED ORDER — PHENTERMINE HCL 37.5 MG PO CAPS: 37.5000 mg | ORAL_CAPSULE | ORAL | 1 refills | Status: DC

## 2021-09-10 NOTE — Patient Instructions (Signed)

## 2021-09-10 NOTE — Progress Notes (Signed)
?Clinical biochemist as a Neurosurgeon for [employer], FNP.,have documented all relevant documentation on the behalf of Arnette Felts, FNP,as directed by  Arnette Felts, FNP while in the presence of Arnette Felts, FNP. ? ?This visit occurred during the SARS-CoV-2 public health emergency.  Safety protocols were in place, including screening questions prior to the visit, additional usage of staff PPE, and extensive cleaning of exam room while observing appropriate contact time as indicated for disinfecting solutions. ? ?Subjective:  ?  ? Patient ID: Pamela Erickson , female    DOB: 09-Feb-1991 , 31 y.o.   MRN: [id number] ? ? ?Chief Complaint  ?Patient presents with  ? Weight Check  ? ? ?HPI ? ?Pt presents for a weight check.  ? ?Starting weight: 206 ?Starting date: 10/24/2020 ?Today's weight: 196 lbs ?Today's date: 09/10/2021  ?Total lbs lost to date: 10 lbs ?Total lbs lost since last in-office visit: 3 lbs ? ?Wt Readings from Last 3 Encounters: ?09/10/21 : 196 lb 12.8 oz (89.3 kg) ?06/11/21 : 199 lb 9.6 oz (90.5 kg) ?01/01/21 : 195 lb 6.4 oz (88.6 kg) ?  ?Exercising: 4 days a week, with AWOL and cardio at [employer] ?Water intake: has been better over the last few weeks.  ?Diet: regular, she had tried to do keto diet for 2 weeks, she is doing low calorie diet (eating about 2000 calories) ?She has muscle mass due to doing weights.  ? ? ? ? ?Past Medical History:  ?Diagnosis Date  ? Abnormal Pap smear   ? last pap 01/2012  ? Anemia   ? During pregnancy  ? Herpes   ? Infection 2011  ? HSV 2  RARE OUTBREAK  ? Sjogren's syndrome (HCC)   ? Torn ACL (anterior cruciate ligament) 2008  ? Vaginal Pap smear, abnormal   ? May 2018; biopsy was normal  ?  ? ?Family History  ?Problem Relation Age of Onset  ? Hypertension Mother   ? Hypertension Father   ? Diabetes Maternal Aunt   ? Hypertension Maternal Grandmother   ? Hypertension Maternal Grandfather   ? ? ? ?Current Outpatient Medications:  ?  hydroxychloroquine (PLAQUENIL) 200 MG  tablet, Take 400 mg by mouth daily. Take 2 tablets daily, Disp: , Rfl:  ?  valACYclovir (VALTREX) 500 MG tablet, Take 500 mg by mouth at bedtime., Disp: , Rfl:  ?  Vitamin D, Ergocalciferol, (DRISDOL) 1.25 MG (50000 UNIT) CAPS capsule, Take 1 capsule (50,000 Units total) by mouth every 7 (seven) days., Disp: 12 capsule, Rfl: 0 ?  phentermine 37.5 MG capsule, Take 1 capsule (37.5 mg total) by mouth every morning., Disp: 30 capsule, Rfl: 1  ? ?Allergies  ?Allergen Reactions  ? Ibuprofen Rash  ?  On hands  ?  ? ?Review of Systems  ?Constitutional: Negative.   ?Respiratory: Negative.    ?Cardiovascular: Negative.  Negative for chest pain, palpitations and leg swelling.  ?Neurological: Negative.   ?Psychiatric/Behavioral: Negative.     ? ?Today's Vitals  ? 09/10/21 1432  ?BP: 112/70  ?Pulse: 88  ?Temp: 98.7 ?F (37.1 ?C)  ?TempSrc: Oral  ?Weight: 196 lb 12.8 oz (89.3 kg)  ?Height: 5' 1.6" (1.565 m)  ? ?Body mass index is 36.46 kg/m?.  ?Wt Readings from Last 3 Encounters:  ?09/10/21 196 lb 12.8 oz (89.3 kg)  ?06/11/21 199 lb 9.6 oz (90.5 kg)  ?01/01/21 195 lb 6.4 oz (88.6 kg)  ? ? ?Objective:  ?Physical Exam ?Vitals reviewed.  ?Constitutional:   ?  General: She is not in acute distress. ?   Appearance: Normal appearance. She is obese.  ?Pulmonary:  ?   Effort: Pulmonary effort is normal. No respiratory distress.  ?Neurological:  ?   General: No focal deficit present.  ?   Mental Status: She is alert and oriented to person, place, and time.  ?   Cranial Nerves: No cranial nerve deficit.  ?   Motor: No weakness.  ?Psychiatric:     ?   Mood and Affect: Mood normal.     ?   Behavior: Behavior normal.     ?   Thought Content: Thought content normal.     ?   Judgment: Judgment normal.  ?  ? ?   ?Assessment And Plan:  ?   ?1. Class 2 obesity due to excess calories with body mass index (BMI) of 36.0 to 36.9 in adult, unspecified whether serious comorbidity present ?ASK- here for weight check ?ASSESS - Body mass index is 36.46  kg/m?., well tolerated, WAIST CIRCUMFERENCE 41 cm ?ADVISE  - on risk of cardiovascular disease currently has a diagnosis of hypertension, on medications. ?AGREE - to increase her water intake  ?ASSIST - refilled phentermine 37.5 mg daily, follow up weight check in 2 months. Encouraged to eat at least 1500 calories a day to be in a calorie deficit ?- phentermine 37.5 MG capsule; Take 1 capsule (37.5 mg total) by mouth every morning.  Dispense: 30 capsule; Refill: 1 ? She is encouraged to strive for BMI less than 30 to decrease cardiac risk. Advised to aim for at least 150 minutes of exercise per week. ? ? ?Patient was given opportunity to ask questions. Patient verbalized understanding of the plan and was able to repeat key elements of the plan. All questions were answered to their satisfaction.  ?Minette Brine, FNP  ? ?I, Minette Brine, FNP, have reviewed all documentation for this visit. The documentation on 09/10/21 for the exam, diagnosis, procedures, and orders are all accurate and complete.  ? ?IF YOU HAVE BEEN REFERRED TO A SPECIALIST, IT MAY TAKE 1-2 WEEKS TO SCHEDULE/PROCESS THE REFERRAL. IF YOU HAVE NOT HEARD FROM US/SPECIALIST IN TWO WEEKS, PLEASE GIVE [place] A CALL AT [phone].  ? ?THE PATIENT IS ENCOURAGED TO PRACTICE SOCIAL DISTANCING DUE TO THE COVID-19 PANDEMIC.   ?

## 2021-11-14 ENCOUNTER — Ambulatory Visit: Payer: Medicaid Other | Admitting: Nurse Practitioner

## 2021-12-13 DIAGNOSIS — Z0000 Encounter for general adult medical examination without abnormal findings: Secondary | ICD-10-CM | POA: Diagnosis not present

## 2021-12-13 DIAGNOSIS — Z3040 Encounter for surveillance of contraceptives, unspecified: Secondary | ICD-10-CM | POA: Diagnosis not present

## 2021-12-13 DIAGNOSIS — Z0001 Encounter for general adult medical examination with abnormal findings: Secondary | ICD-10-CM | POA: Diagnosis not present

## 2021-12-13 DIAGNOSIS — Z6836 Body mass index (BMI) 36.0-36.9, adult: Secondary | ICD-10-CM | POA: Diagnosis not present

## 2021-12-13 DIAGNOSIS — Z113 Encounter for screening for infections with a predominantly sexual mode of transmission: Secondary | ICD-10-CM | POA: Diagnosis not present

## 2021-12-13 DIAGNOSIS — Z124 Encounter for screening for malignant neoplasm of cervix: Secondary | ICD-10-CM | POA: Diagnosis not present

## 2021-12-13 DIAGNOSIS — A609 Anogenital herpesviral infection, unspecified: Secondary | ICD-10-CM | POA: Diagnosis not present

## 2021-12-13 DIAGNOSIS — Z01411 Encounter for gynecological examination (general) (routine) with abnormal findings: Secondary | ICD-10-CM | POA: Diagnosis not present

## 2021-12-13 DIAGNOSIS — R87810 Cervical high risk human papillomavirus (HPV) DNA test positive: Secondary | ICD-10-CM | POA: Diagnosis not present

## 2021-12-13 LAB — HM PAP SMEAR: HM Pap smear: ABNORMAL

## 2022-01-16 DIAGNOSIS — Z23 Encounter for immunization: Secondary | ICD-10-CM | POA: Diagnosis not present

## 2022-03-17 ENCOUNTER — Encounter (HOSPITAL_COMMUNITY): Payer: Self-pay | Admitting: *Deleted

## 2022-03-17 ENCOUNTER — Ambulatory Visit (INDEPENDENT_AMBULATORY_CARE_PROVIDER_SITE_OTHER): Payer: Medicaid Other

## 2022-03-17 ENCOUNTER — Ambulatory Visit (HOSPITAL_COMMUNITY)
Admission: EM | Admit: 2022-03-17 | Discharge: 2022-03-17 | Disposition: A | Discharge status: Home / Self Care | Payer: Medicaid Other | Attending: Emergency Medicine | Admitting: Emergency Medicine

## 2022-03-17 DIAGNOSIS — S92354A Nondisplaced fracture of fifth metatarsal bone, right foot, initial encounter for closed fracture: Secondary | ICD-10-CM

## 2022-03-17 DIAGNOSIS — M7989 Other specified soft tissue disorders: Secondary | ICD-10-CM | POA: Diagnosis not present

## 2022-03-17 MED ORDER — IBUPROFEN 800 MG PO TABS
800.0000 mg | ORAL_TABLET | Freq: Once | ORAL | Status: AC
  Administered 2022-03-17: 800 mg via ORAL

## 2022-03-17 MED ORDER — IBUPROFEN 800 MG PO TABS
ORAL_TABLET | ORAL | Status: AC
  Filled 2022-03-17: qty 1

## 2022-03-17 NOTE — ED Triage Notes (Signed)
Pt states she was running on gravel on Thursday and twisted her ankle. The pain is mostly in the top of her right foot. She has been taking IBU for the pain.

## 2022-03-17 NOTE — Discharge Instructions (Addendum)
Call the podiatry office first thing tomorrow morning.  They will schedule an appointment to see you and evaluate your injury.  In the meantime, wear the boot for support.  Elevate the leg and apply ice to help with swelling and pain.  You can continue ibuprofen as needed.

## 2022-03-17 NOTE — ED Provider Notes (Signed)
[facility]    CSN: [id number] Arrival date & time: 03/17/22  1003      History   Chief Complaint Chief Complaint  Patient presents with   Ankle Pain    HPI Pamela Erickson is a 31 y.o. female.  Presents with a right foot injury. Was running 3 days ago when she twisted her ankle and fell on it.  Reports pain to the top of the foot, midfoot.  Area of swelling.  She is able to walk but reports pain with weightbearing. 8/10 She has been taking ibuprofen for the pain, last dose was yesterday.   Past Medical History:  Diagnosis Date   Abnormal Pap smear    last pap 01/2012   Anemia    During pregnancy   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   Sjogren's syndrome (HCC)    Torn ACL (anterior cruciate ligament) 2008   Vaginal Pap smear, abnormal    May 2018; biopsy was normal    Patient Active Problem List   Diagnosis Date Noted   Obesity (BMI 35.0-39.9 without comorbidity) 04/20/2020   VBAC, delivered 09/19/2018   Postpartum hemorrhage 09/17/2018   Sjogren's syndrome with keratoconjunctivitis sicca (HCC) 02/27/2018   Morbid obesity (HCC) 02/27/2018   Sickle cell trait (HCC) 08/01/2017   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 Authorizing Provider  hydroxychloroquine (PLAQUENIL) 200 MG tablet Take 400 mg by mouth daily. Take 2 tablets daily   Yes [provider]  valACYclovir  (VALTREX) 500 MG tablet Take 500 mg by mouth at bedtime.   Yes [provider]  phentermine 37.5 MG capsule Take 1 capsule (37.5 mg total) by mouth every morning. 09/10/21   Arnette Felts, FNP  Vitamin D, Ergocalciferol, (DRISDOL) 1.25 MG (50000 UNIT) CAPS capsule Take 1 capsule (50,000 Units total) by mouth every 7 (seven) days. 11/04/20   Arnette Felts, FNP    Family History Family History  Problem Relation Age of Onset   Hypertension Mother    Hypertension Father    Diabetes Maternal Aunt    Hypertension Maternal Grandmother    Hypertension Maternal Grandfather     Social History Social History   Tobacco Use   Smoking status: Never   Smokeless tobacco: Never  Vaping Use   Vaping Use: Never used  Substance Use Topics   Alcohol use: No    Comment: OCC   Drug use: Yes    Types: Marijuana    Comment: OCC LAST USE 06/2012     Allergies   Patient has no active allergies.   Review of Systems Review of  Systems Per HPI  Physical Exam Triage Vital Signs ED Triage Vitals  Enc Vitals Group     BP 03/17/22 1023 119/83     Pulse Rate 03/17/22 1023 84     Resp 03/17/22 1023 18     Temp 03/17/22 1023 98.1 F (36.7 C)     Temp Source 03/17/22 1023 Oral     SpO2 03/17/22 1023 99 %     Weight --      Height --      Head Circumference --      Peak Flow --      Pain Score 03/17/22 1021 8     Pain Loc --      Pain Edu? --      Excl. in GC? --    No data found.  Updated Vital Signs BP 119/83 (BP Location: Right Arm)   Pulse 84   Temp 98.1 F (36.7 C) (Oral)   Resp 18   LMP 03/14/2022 (Exact Date)   SpO2 99%    Physical Exam Vitals and nursing note reviewed.  Constitutional:      General: She is not in acute distress.    Appearance: Normal appearance.  HENT:     Mouth/Throat:     Pharynx: Oropharynx is clear.  Eyes:     Conjunctiva/sclera: Conjunctivae normal.  Cardiovascular:     Rate and Rhythm: Normal rate and regular rhythm.     Pulses: Normal  pulses.     Heart sounds: Normal heart sounds.  Pulmonary:     Effort: Pulmonary effort is normal.     Breath sounds: Normal breath sounds.  Musculoskeletal:     Right foot: Normal range of motion and normal capillary refill. Swelling and tenderness present. No deformity. Normal pulse.     Comments: Area of swelling over right midfoot, tender to touch.  Ankle is nontender with full range of motion.  Skin:    Capillary Refill: Capillary refill takes less than 2 seconds.  Neurological:     Mental Status: She is alert and oriented to person, place, and time.     Comments: Distal sensation intact, cap refill less than 2 seconds, antalgic gait due to pain      UC Treatments / Results  Labs (all labs ordered are listed, but only abnormal results are displayed) Labs Reviewed - No data to display  EKG   Radiology DG Foot Complete Right  Result Date: 03/17/2022 CLINICAL DATA:  Fall, twisting injury.  Pain EXAM: RIGHT FOOT COMPLETE - 3+ VIEW COMPARISON:  None Available. FINDINGS: Acute mildly comminuted nondisplaced intra-articular fracture of the fifth metatarsal base. Prominent vascular channel versus is nondisplaced fracture of the fourth metatarsal proximal metaphysis. No additional fractures are seen. Normal alignment. Mild soft tissue swelling of the forefoot. IMPRESSION: 1. Acute nondisplaced intra-articular fracture of the fifth metatarsal base. 2. Prominent vascular channel versus nondisplaced fracture of the fourth metatarsal proximal metaphysis. Correlate for point tenderness. Electronically Signed   By: Duanne Guess D.O.   On: 03/17/2022 10:50    Procedures Procedures (including critical care time)  Medications Ordered in UC Medications  ibuprofen (ADVIL) tablet 800 mg (800 mg Oral Given 03/17/22 1037)    Initial Impression / Assessment and Plan / UC Course  I have reviewed the triage vital signs and the nursing notes.  Pertinent labs & imaging results that were  available during my care of the patient were reviewed by me and considered in my medical decision making (see chart  for details).  Ibuprofen dose given with improvement in pain. Right foot x-ray showing fracture through 5th metatarsal base, intraarticular and mildy comminuted. Possible nondisplaced 4th metatarsal proximal metaphysis.  CAM boot. WBAT. Follow up with podiatry. Given [facility] foot [facility] ankle contact info. Elevate, ice, ibuprofen as needed. Return precautions discussed. Patient agrees to plan  Final Clinical Impressions(s) / UC Diagnoses   Final diagnoses:  Closed nondisplaced fracture of fifth metatarsal bone of right foot, initial encounter     Discharge Instructions      Call the podiatry office first thing tomorrow morning.  They will schedule an appointment to see you and evaluate your injury.  In the meantime, wear the boot for support.  Elevate the leg and apply ice to help with swelling and pain.  You can continue ibuprofen as needed.     ED Prescriptions   None    PDMP not reviewed this encounter.   Keir Viernes, Lurena Joiner, [place] 03/17/22 1105

## 2022-03-19 DIAGNOSIS — Z23 Encounter for immunization: Secondary | ICD-10-CM | POA: Diagnosis not present

## 2022-03-26 ENCOUNTER — Encounter: Payer: Self-pay | Admitting: Podiatry

## 2022-03-26 ENCOUNTER — Ambulatory Visit: Payer: Medicaid Other | Admitting: Podiatry

## 2022-03-26 DIAGNOSIS — R9389 Abnormal findings on diagnostic imaging of other specified body structures: Secondary | ICD-10-CM | POA: Insufficient documentation

## 2022-03-26 DIAGNOSIS — S92354A Nondisplaced fracture of fifth metatarsal bone, right foot, initial encounter for closed fracture: Secondary | ICD-10-CM | POA: Diagnosis not present

## 2022-03-26 DIAGNOSIS — N898 Other specified noninflammatory disorders of vagina: Secondary | ICD-10-CM | POA: Insufficient documentation

## 2022-03-26 DIAGNOSIS — B9689 Other specified bacterial agents as the cause of diseases classified elsewhere: Secondary | ICD-10-CM | POA: Insufficient documentation

## 2022-03-26 DIAGNOSIS — L0291 Cutaneous abscess, unspecified: Secondary | ICD-10-CM | POA: Insufficient documentation

## 2022-03-26 DIAGNOSIS — A491 Streptococcal infection, unspecified site: Secondary | ICD-10-CM | POA: Insufficient documentation

## 2022-03-26 DIAGNOSIS — N390 Urinary tract infection, site not specified: Secondary | ICD-10-CM | POA: Insufficient documentation

## 2022-03-26 NOTE — Progress Notes (Signed)
Subjective:  Patient ID: Pamela Erickson, female    DOB: 09/25/1990,  MRN: [id number] HPI Chief Complaint  Patient presents with   Foot Injury    DOI: 03/20/22 - lateral and dorsal foot right - rolled foot running on a gravel track, ER xrayed-fractured 5th met, wearing walking boot   New Patient (Initial Visit)    31 y.o. female presents with the above complaint.   ROS: Denies fever chills nausea vomiting muscle aches pains calf pain back pain chest pain shortness of breath.  Past Medical History:  Diagnosis Date   Abnormal Pap smear    last pap 01/2012   Anemia    During pregnancy   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   Sjogren's syndrome ([place])    Torn ACL (anterior cruciate ligament) 2008   Vaginal Pap smear, abnormal    May 2018; biopsy was normal   Past Surgical History:  Procedure Laterality Date   ANTERIOR CRUCIATE LIGAMENT REPAIR  2008   CESAREAN SECTION N/A 04/25/2013   Procedure: CESAREAN SECTION;  Surgeon: Betsy Coder, MD;  Location: [facility];  Service: Obstetrics;  Laterality: N/A;   COLPOSCOPY     endocervical curettage  2018    Current Outpatient Medications:    hydroxychloroquine (PLAQUENIL) 200 MG tablet, Take 400 mg by mouth daily. Take 2 tablets daily, Disp: , Rfl:    phentermine 37.5 MG capsule, Take 1 capsule (37.5 mg total) by mouth every morning., Disp: 30 capsule, Rfl: 1   SRONYX 0.1-20 MG-MCG tablet, Take 1 tablet by mouth daily., Disp: , Rfl:    valACYclovir (VALTREX) 500 MG tablet, Take 500 mg by mouth at bedtime., Disp: , Rfl:   Allergies  Allergen Reactions   Cephalexin     Other reaction(s): other   Review of Systems Objective:  There were no vitals filed for this visit.  General: Well developed, nourished, in no acute distress, alert and oriented x3   Dermatological: Skin is warm, dry and supple bilateral. Nails x 10 are well maintained; remaining integument appears unremarkable at this time. There are no open sores, no  preulcerative lesions, no rash or signs of infection present.  Vascular: Dorsalis Pedis artery and Posterior Tibial artery pedal pulses are 2/4 bilateral with immedate capillary fill time. Pedal hair growth present. No varicosities and no lower extremity edema present bilateral.   Neruologic: Grossly intact via light touch bilateral. Vibratory intact via tuning fork bilateral. Protective threshold with Semmes Wienstein monofilament intact to all pedal sites bilateral. Patellar and Achilles deep tendon reflexes 2+ bilateral. No Babinski or clonus noted bilateral.   Musculoskeletal: No gross boney pedal deformities bilateral. No pain, crepitus, or limitation noted with foot and ankle range of motion bilateral. Muscular strength 5/5 in all groups tested bilateral.  She has swelling and ecchymosis overlying the fifth metatarsal base of the right foot.  She has tenderness on plantarflexion against resistance and abduction against resistance.  She also has tenderness on passive inversion of the subtalar joint.  Gait: Unassisted, Nonantalgic.    Radiographs:  I reviewed radiographs taken on the 13th which did not did demonstrate a fracture at the base of the fifth metatarsal primarily.  Is nondisplaced noncomminuted.  Assessment & Plan:   Assessment: Fracture fifth metatarsal base right.  Plan: Make sure that she wears her cam boot at all times and I did instruct her to purchase crutches and a knee scooter.  Instructing her to start nonweightbearing.  I will follow-up with her in about 4 weeks at which time another x-ray will be performed.  We did discuss the possibility of surgical intervention.     Max T. Roxie, [place]

## 2022-04-01 ENCOUNTER — Encounter: Payer: Self-pay | Admitting: Nurse Practitioner

## 2022-04-01 ENCOUNTER — Ambulatory Visit: Payer: Medicaid Other | Admitting: Nurse Practitioner

## 2022-04-01 VITALS — BP 118/82 | HR 97 | Temp 98.8°F | Ht 61.0 in | Wt 212.8 lb

## 2022-04-01 DIAGNOSIS — E559 Vitamin D deficiency, unspecified: Secondary | ICD-10-CM | POA: Diagnosis not present

## 2022-04-01 DIAGNOSIS — S92514D Nondisplaced fracture of proximal phalanx of right lesser toe(s), subsequent encounter for fracture with routine healing: Secondary | ICD-10-CM | POA: Diagnosis not present

## 2022-04-01 DIAGNOSIS — D573 Sickle-cell trait: Secondary | ICD-10-CM | POA: Diagnosis not present

## 2022-04-01 DIAGNOSIS — M3501 Sicca syndrome with keratoconjunctivitis: Secondary | ICD-10-CM

## 2022-04-01 DIAGNOSIS — Z6841 Body Mass Index (BMI) 40.0 and over, adult: Secondary | ICD-10-CM

## 2022-04-01 MED ORDER — PHENTERMINE HCL 15 MG PO CAPS: 15.0000 mg | ORAL_CAPSULE | ORAL | 1 refills | Status: DC

## 2022-04-01 MED ORDER — VITAMIN D (ERGOCALCIFEROL) 1.25 MG (50000 UNIT) PO CAPS: 50000.0000 [IU] | ORAL_CAPSULE | ORAL | 1 refills | Status: DC

## 2022-04-01 NOTE — Patient Instructions (Signed)
Obesity, Adult ?Obesity is having too much body fat. Being obese means that your weight is more than what is healthy for you.  ?BMI (body mass index) is a number that explains how much body fat you have. If you have a BMI of 30 or more, you are obese. ?Obesity can cause serious health problems, such as: ?Stroke. ?Coronary artery disease (CAD). ?Type 2 diabetes. ?Some types of cancer. ?High blood pressure (hypertension). ?High cholesterol. ?Gallbladder stones. ?Obesity can also contribute to: ?Osteoarthritis. ?Sleep apnea. ?Infertility problems. ?What are the causes? ?Eating meals each day that are high in calories, sugar, and fat. ?Drinking a lot of drinks that have sugar in them. ?Being born with genes that may make you more likely to become obese. ?Having a medical condition that causes obesity. ?Taking certain medicines. ?Sitting a lot (having a sedentary lifestyle). ?Not getting enough sleep. ?What increases the risk? ?Having a family history of obesity. ?Living in an area with limited access to: ?Parks, recreation centers, or sidewalks. ?Healthy food choices, such as grocery stores and farmers' markets. ?What are the signs or symptoms? ?The main sign is having too much body fat. ?How is this treated? ?Treatment for this condition often includes changing your lifestyle. Treatment may include: ?Changing your diet. This may include making a healthy meal plan. ?Exercise. This may include activity that causes your heart to beat faster (aerobic exercise) and strength training. Work with your doctor to design a program that works for you. ?Medicine to help you lose weight. This may be used if you are not able to lose one pound a week after 6 weeks of healthy eating and more exercise. ?Treating conditions that cause the obesity. ?Surgery. Options may include gastric banding and gastric bypass. This may be done if: ?Other treatments have not helped to improve your condition. ?You have a BMI of 40 or higher. ?You have  life-threatening health problems related to obesity. ?Follow these instructions at home: ?Eating and drinking ? ?Follow advice from your doctor about what to eat and drink. Your doctor may tell you to: ?Limit fast food, sweets, and processed snack foods. ?Choose low-fat options. For example, choose low-fat milk instead of whole milk. ?Eat five or more servings of fruits or vegetables each day. ?Eat at home more often. This gives you more control over what you eat. ?Choose healthy foods when you eat out. ?Learn to read food labels. This will help you learn how much food is in one serving. ?Keep low-fat snacks available. ?Avoid drinks that have a lot of sugar in them. These include soda, fruit juice, iced tea with sugar, and flavored milk. ?Drink enough water to keep your pee (urine) pale yellow. ?Do not go on fad diets. ?Physical activity ?Exercise often, as told by your doctor. Most adults should get up to 150 minutes of moderate-intensity exercise every week.Ask your doctor: ?What types of exercise are safe for you. ?How often you should exercise. ?Warm up and stretch before being active. ?Do slow stretching after being active (cool down). ?Rest between times of being active. ?Lifestyle ?Work with your doctor and a food expert (dietitian) to set a weight-loss goal that is best for you. ?Limit your screen time. ?Find ways to reward yourself that do not involve food. ?Do not drink alcohol if: ?Your doctor tells you not to drink. ?You are pregnant, may be pregnant, or are planning to become pregnant. ?If you drink alcohol: ?Limit how much you have to: ?0-1 drink a day for women. ?0-2 drinks   a day for men. ?Know how much alcohol is in your drink. In the [place] one drink equals one 12 oz bottle of beer (355 mL), one 5 oz glass of wine (148 mL), or one 1? oz glass of hard liquor (44 mL). ?General instructions ?Keep a weight-loss journal. This can help you keep track of: ?The food that you eat. ?How much exercise you  get. ?Take over-the-counter and prescription medicines only as told by your doctor. ?Take vitamins and supplements only as told by your doctor. ?Think about joining a support group. ?Pay attention to your mental health as obesity can lead to depression or self esteem issues. ?Keep all follow-up visits. ?Contact a doctor if: ?You cannot meet your weight-loss goal after you have changed your diet and lifestyle for 6 weeks. ?You are having trouble breathing. ?Summary ?Obesity is having too much body fat. ?Being obese means that your weight is more than what is healthy for you. ?Work with your doctor to set a weight-loss goal. ?Get regular exercise as told by your doctor. ?This information is not intended to replace advice given to you by your health care provider. Make sure you discuss any questions you have with your health care provider. ?Document Revised: 01/30/2021 Document Reviewed: 01/30/2021 ?Elsevier Patient Education ? 2023 [employer] ? ?

## 2022-04-01 NOTE — Progress Notes (Signed)
I,Pamela Erickson,acting as a Neurosurgeon for [employer], FNP.,have documented all relevant documentation on the behalf of Pamela Felts, FNP,as directed by  Pamela Felts, FNP while in the presence of Pamela Felts, FNP.  Subjective:     Patient ID: Pamela Erickson , female    DOB: [birth year] , 31 y.o.   MRN: [id number]   Chief Complaint  Patient presents with   Weight Loss    HPI  Pt presents for a weight check.   Wt Readings from Last 3 Encounters: 04/01/22 : 212 lb 12.8 oz (96.5 kg) 09/10/21 : 196 lb 12.8 oz (89.3 kg) 06/11/21 : 199 lb 9.6 oz (90.5 kg)  She was running and her foot rolled over and she broke two bones in her foot 2 weeks ago. Plan to keep in walking boot, will re-xray to see if will heal on its own. She had just went back she "fell off from her routine". She has not been on phentermine. She is planning to do calorie counting. She is not cleared to exercise.      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 Take 2 tablets daily, Disp: , Rfl:    phentermine 15 MG capsule, Take 1 capsule (15 mg total) by mouth every morning., Disp: 30 capsule, Rfl: 1   SRONYX 0.1-20 MG-MCG tablet, Take 1 tablet by mouth daily., Disp: , Rfl:    valACYclovir (VALTREX) 500 MG tablet, Take 500 mg by mouth at bedtime., Disp: , Rfl:    Vitamin D, Ergocalciferol, (DRISDOL) 1.25 MG (50000 UNIT) CAPS capsule, Take 1 capsule (50,000  Units total) by mouth 2 (two) times a week., Disp: 24 capsule, Rfl: 1   Allergies  Allergen Reactions   Cephalexin     Other reaction(s): other     Review of Systems  Constitutional: Negative.   HENT: Negative.    Eyes: Negative.   Respiratory: Negative.    Cardiovascular: Negative.   Gastrointestinal: Negative.   Endocrine: Negative.   Genitourinary: Negative.   Musculoskeletal: Negative.   Skin: Negative.   Allergic/Immunologic: Negative.   Neurological: Negative.   Hematological: Negative.   Psychiatric/Behavioral: Negative.       Today's Vitals   04/01/22 1628  BP: 118/82  Pulse: 97  Temp: 98.8 F (37.1 C)  TempSrc: Oral  Weight: 212 lb 12.8 oz (96.5 kg)  Height: 5\' 1"  (1.549 m)   Body mass index is 40.21 kg/m.  Wt Readings from Last 3 Encounters:  04/01/22 212 lb 12.8 oz (96.5 kg)  09/10/21 196 lb 12.8 oz (89.3 kg)  06/11/21 199 lb 9.6 oz (90.5 kg)    Objective:  Physical Exam Vitals reviewed.  Constitutional:      General: She is not in acute distress.    Appearance: Normal appearance. She is obese.  Pulmonary:     Effort: Pulmonary effort is normal. No respiratory distress.  Musculoskeletal:     Comments: Wearing a walking boot due to 2 broken bones to her right foot  Neurological:     General: No focal deficit present.     Mental Status: She is alert and oriented to person, place, and time.     Cranial Nerves: No cranial nerve deficit.     Motor: No weakness.  Psychiatric:        Mood and Affect: Mood normal.        Behavior: Behavior normal.        Thought Content: Thought content normal.        Judgment: Judgment normal.         Assessment And Plan:     1. Vitamin D deficiency Comments: Will check levels especially due to her recent fracture. Will increase her vitamin d to twice a week.  - Vitamin D (25 hydroxy) - Vitamin D, Ergocalciferol, (DRISDOL) 1.25 MG (50000 UNIT) CAPS capsule; Take 1 capsule (50,000 Units total) by mouth 2  (two) times a week.  Dispense: 24 capsule; Refill: 1  2. Sjogren's syndrome with keratoconjunctivitis sicca ([place]) Comments: Continues to follow up with Rheumatology. She did not feel the joint pain when she was exercising regularly  3. Sickle cell trait ([place]) Comments: No issues at this time  4. Closed nondisplaced fracture of proximal phalanx of lesser toe of right foot with routine healing, subsequent encounter Comments: Being followed by Dr. Milinda Pointer, wearing a walking boot and is to f/u in 2 weeks.   5. Class 3 severe obesity due to excess calories without serious comorbidity with body mass index (BMI) of 40.0 to 44.9 in adult [facility]) She is encouraged to strive for BMI less than 30 to decrease cardiac risk. Advised to aim for at least 150 minutes of exercise per week. Will treat with phentermine for 3 months as a boost. Will then consider other weight loss medications. Also encouraged to limit intake of breads, pastas, white potatoes and sweets.  - phentermine 15 MG capsule; Take 1 capsule (15 mg total) by mouth every morning.  Dispense: 30 capsule; Refill: 1    Patient was given opportunity to ask questions. Patient verbalized understanding of the plan and was able to repeat key elements of the plan. All questions were answered to their satisfaction.  Pamela Brine, FNP   I, Pamela Brine, FNP, have reviewed all documentation for this visit. The documentation on 04/01/22 for the exam, diagnosis, procedures, and orders are all accurate and complete.   IF YOU HAVE BEEN REFERRED TO A SPECIALIST, IT MAY TAKE 1-2 WEEKS TO SCHEDULE/PROCESS THE REFERRAL. IF YOU HAVE NOT HEARD FROM US/SPECIALIST IN TWO WEEKS, PLEASE GIVE [place] A CALL AT [phone].   THE PATIENT IS ENCOURAGED TO PRACTICE SOCIAL DISTANCING DUE TO THE COVID-19 PANDEMIC.

## 2022-04-02 LAB — VITAMIN D 25 HYDROXY (VIT D DEFICIENCY, FRACTURES): Vit D, 25-Hydroxy: 28.4 ng/mL — ABNORMAL LOW (ref 30.0–100.0)

## 2022-04-23 ENCOUNTER — Ambulatory Visit: Payer: Medicaid Other | Admitting: Podiatry

## 2022-04-23 ENCOUNTER — Encounter: Payer: Self-pay | Admitting: Podiatry

## 2022-04-23 ENCOUNTER — Ambulatory Visit (INDEPENDENT_AMBULATORY_CARE_PROVIDER_SITE_OTHER): Payer: Medicaid Other

## 2022-04-23 DIAGNOSIS — S99191D Other physeal fracture of right metatarsal, subsequent encounter for fracture with routine healing: Secondary | ICD-10-CM

## 2022-04-23 NOTE — Progress Notes (Signed)
Presents today for follow-up of her fifth metatarsal Jones fracture right foot.  States that is feeling a little better.  Objective: She presents today without the knee scooter or crutches that I recommended that she use.  Last time she was and I had explained to her that if she were to walk on this any longer without offloading it then she would run the risk of avulsing the fracture fracture fragment and this is what she is done today based on the x-ray.  Radiographs taken today demonstrate about a 5 mm diastases from the proximal fracture fragment and the diaphysis of the bone.  She does have mild tenderness on palpation to the area is mildly edematous right.  Assessment worsening of her Jones fracture right foot.  Plan: I am going to give her 2 more weeks of opportunity for this to heal in a nonweightbearing fashion and she will follow-up in 2 weeks with Dr. Sherryle Lis to consider surgical reduction.

## 2022-05-06 ENCOUNTER — Encounter: Payer: Self-pay | Admitting: Nurse Practitioner

## 2022-05-07 ENCOUNTER — Other Ambulatory Visit: Payer: Self-pay | Admitting: Nurse Practitioner

## 2022-05-07 MED ORDER — PHENTERMINE HCL 37.5 MG PO TABS: 37.5000 mg | ORAL_TABLET | Freq: Every day | ORAL | 1 refills | Status: DC

## 2022-05-09 ENCOUNTER — Ambulatory Visit: Payer: Medicaid Other | Admitting: Podiatry

## 2022-05-09 ENCOUNTER — Ambulatory Visit (INDEPENDENT_AMBULATORY_CARE_PROVIDER_SITE_OTHER): Payer: Medicaid Other

## 2022-05-09 DIAGNOSIS — S99191D Other physeal fracture of right metatarsal, subsequent encounter for fracture with routine healing: Secondary | ICD-10-CM | POA: Diagnosis not present

## 2022-05-09 NOTE — Progress Notes (Signed)
  Subjective:  Patient ID: ELVERIA LAUDERBAUGH, female    DOB: Feb 04, 1991,  MRN: [id number]  Chief Complaint  Patient presents with   Fracture    Follow up fracture right foot    31 y.o. female presents with the above complaint. History confirmed with patient.  She is referred to me by Dr. Milinda Pointer.  She has been using the knee scooter and remaining in the cam boot.  Objective:  Physical Exam: warm, good capillary refill, no trophic changes or ulcerative lesions, normal DP and PT pulses, normal sensory exam, and minimal tenderness or pain in the right foot today.   Radiographs: Multiple views x-ray of the right foot: Good stability of fracture noted no increasing diastases there does appear to be early bridging Assessment:   1. Closed fracture of base of fifth metatarsal bone of right foot at metaphyseal-diaphyseal junction with routine healing, subsequent encounter      Plan:  Patient was evaluated and treated and all questions answered.  We reviewed her radiographs together.  So far seems to be responding now to operative treatment since she is off of it and using the knee scooter.  I recommended she continue this for another 4 weeks until December 1 and then she may begin to weight-bear as tolerated in the cam walker boot.  I will see her back in 6 weeks for new radiographs.  Hopefully can transition back to shoe gear and/or ankle brace at that point.  Return in about 6 weeks (around 06/20/2022) for fracture follow up (right foot xrays).

## 2022-06-10 ENCOUNTER — Ambulatory Visit: Payer: Medicaid Other | Admitting: Nurse Practitioner

## 2022-06-20 ENCOUNTER — Ambulatory Visit: Payer: Medicaid Other | Admitting: Podiatry

## 2022-06-20 ENCOUNTER — Ambulatory Visit (INDEPENDENT_AMBULATORY_CARE_PROVIDER_SITE_OTHER): Payer: Medicaid Other

## 2022-06-20 DIAGNOSIS — S99191K Other physeal fracture of right metatarsal, subsequent encounter for fracture with nonunion: Secondary | ICD-10-CM | POA: Diagnosis not present

## 2022-06-20 DIAGNOSIS — S99191D Other physeal fracture of right metatarsal, subsequent encounter for fracture with routine healing: Secondary | ICD-10-CM

## 2022-06-20 NOTE — Progress Notes (Signed)
  Subjective:  Patient ID: [name], female    DOB: 1991-02-27,  MRN: [id number]  Chief Complaint  Patient presents with   Fracture    Closed fracture of base of fifth metatarsal bone of right foot at metaphyseal-diaphyseal junction with routine healing, subsequent encounter    31 y.o. female presents with the above complaint. History confirmed with patient.  She is doing well she is not having much pain  Objective:  Physical Exam: warm, good capillary refill, no trophic changes or ulcerative lesions, normal DP and PT pulses, normal sensory exam, and minimal tenderness or pain in the right foot today.   Radiographs: Multiple views x-ray of the right foot: Persistent lucency at fracture site noted Assessment:   1. 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  We reviewed her radiographs together.  Has had an increase in healing since the initial fracture on 03/17/2022 but there is persistent lucency at the fracture site.  So far overall relatively asymptomatic.  She is at risk for refracture.  I recommend noninvasive bone stimulation.  This has been ordered.  I will see her back in 8 weeks for new x-rays.  Return in about 8 weeks (around 08/15/2022) for fracture follow up (new x-rays).

## 2022-06-25 NOTE — Progress Notes (Signed)
Process has already been started.

## 2022-07-02 DIAGNOSIS — S92354K Nondisplaced fracture of fifth metatarsal bone, right foot, subsequent encounter for fracture with nonunion: Secondary | ICD-10-CM | POA: Diagnosis not present

## 2022-07-08 NOTE — L&D Delivery Note (Signed)
  Signed      Delivery Note      Patient Name: Pamela Erickson DOB: 08/17/1994 MRN: [id number]   Date of admission: 02/23/2023 Delivering MD:  Dale Red Wing Date of delivery: 02/23/2023 Type of delivery: SVD   Newborn Data: Live born newborn  Birth Weight:   APGAR:9 ,9    Newborn Delivery   Birth date/time:  Delivery type: Vaginal, Spontaneous        Pamela Erickson, 32 y.o., @ [redacted]w[redacted]d,  G2P1001, who was admitted for Not evaluated. and latent labor . I was called to the room when she progressed 2+ station in the second stage of labor.  She pushed for 30/min.  She delivered a viable infant, cephalic and restituted to the LOA position over an intact perineum.  A nuchal cord   was not identified. The baby was placed on maternal abdomen while initial step of NRP were perfmored (Dry, Stimulated, and warmed). Hat placed on baby for thermoregulation. Delayed cord clamping was performed for 2 minutes.  Cord double clamped and cut.  Cord cut by FOB. Apgar scores were 9 and 9. Prophylactic Pitocin was started in the third stage of labor for active management. The placenta delivered spontaneously, shultz, with a 3 vessel cord and was sent to LD.  Inspection revealed none. An examination of the vaginal vault and cervix was free from lacerations. The uterus was firm, bleeding stable.   Placenta and umbilical artery blood gas were not sent.  There were no complications during the procedure.  Mom and baby skin to skin following delivery. Left in stable condition.   Maternal Info: Anesthesia: Epidural Episiotomy: no Lacerations:  no Suture Repair: no Est. Blood Loss (mL):    Newborn Info:   Baby Sex: female Circumcision: in pt circ desired Babies Name: jermaine APGAR (1 MIN):  9 APGAR (5 MINS):  9 APGAR (10 MINS):       Mom to postpartum.  Baby to Couplet care / Skin to Skin.   Delivery Report:    Review the Delivery Report for details.    [facility]  CNM, FNP-C, PMHNP-BC  [id number]  [place], [place] [address]  Cell: [phone]  Office Phone: [phone] Fax: [phone] 02/23/2023  7:05 PM

## 2022-07-17 DIAGNOSIS — M3509 Sicca syndrome with other organ involvement: Secondary | ICD-10-CM | POA: Diagnosis not present

## 2022-07-17 DIAGNOSIS — M3501 Sicca syndrome with keratoconjunctivitis: Secondary | ICD-10-CM | POA: Diagnosis not present

## 2022-07-24 ENCOUNTER — Ambulatory Visit: Payer: Medicaid Other | Admitting: Nurse Practitioner

## 2022-08-05 DIAGNOSIS — Z362 Encounter for other antenatal screening follow-up: Secondary | ICD-10-CM | POA: Diagnosis not present

## 2022-08-05 DIAGNOSIS — Z113 Encounter for screening for infections with a predominantly sexual mode of transmission: Secondary | ICD-10-CM | POA: Diagnosis not present

## 2022-08-05 DIAGNOSIS — Z3A10 10 weeks gestation of pregnancy: Secondary | ICD-10-CM | POA: Diagnosis not present

## 2022-08-05 DIAGNOSIS — O3680X9 Pregnancy with inconclusive fetal viability, other fetus: Secondary | ICD-10-CM | POA: Diagnosis not present

## 2022-08-05 DIAGNOSIS — N912 Amenorrhea, unspecified: Secondary | ICD-10-CM | POA: Diagnosis not present

## 2022-08-05 DIAGNOSIS — Z3481 Encounter for supervision of other normal pregnancy, first trimester: Secondary | ICD-10-CM | POA: Diagnosis not present

## 2022-08-06 ENCOUNTER — Other Ambulatory Visit: Payer: Self-pay

## 2022-08-06 DIAGNOSIS — Z363 Encounter for antenatal screening for malformations: Secondary | ICD-10-CM

## 2022-08-08 LAB — OB RESULTS CONSOLE RUBELLA ANTIBODY, IGM: Rubella: IMMUNE

## 2022-08-08 LAB — OB RESULTS CONSOLE HEPATITIS B SURFACE ANTIGEN: Hepatitis B Surface Ag: NEGATIVE

## 2022-08-08 LAB — HEPATITIS C ANTIBODY: HCV Ab: NEGATIVE

## 2022-08-11 ENCOUNTER — Other Ambulatory Visit: Payer: Self-pay

## 2022-08-15 ENCOUNTER — Ambulatory Visit: Payer: Medicaid Other | Admitting: Podiatry

## 2022-08-15 ENCOUNTER — Ambulatory Visit: Payer: Medicaid Other

## 2022-08-15 ENCOUNTER — Other Ambulatory Visit: Payer: Self-pay

## 2022-08-15 DIAGNOSIS — S99191K Other physeal fracture of right metatarsal, subsequent encounter for fracture with nonunion: Secondary | ICD-10-CM | POA: Diagnosis not present

## 2022-08-15 NOTE — Progress Notes (Signed)
  Subjective:  Patient ID: Pamela Erickson, female    DOB: 30-Nov-1990,  MRN: [id number]  Chief Complaint  Patient presents with   Fracture    fracture follow up 8 week - xrays were NOT done today - patient reports that she is [redacted] weeks pregnant     32 y.o. female presents with the above complaint. History confirmed with patient.  She says that is doing much better she is not having any pain she is back in regular shoe gear the only thing she has not done yet is exercising the gym  Objective:  Physical Exam: warm, good capillary refill, no trophic changes or ulcerative lesions, normal DP and PT pulses, normal sensory exam, and no pain no edema around the fifth metatarsal base or shaft, she has good 5 out of 5 strength to resisted eversion.  Assessment:   1. 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  Clinically is doing much better she has no symptoms of fracture or nonunion at this point.  I recommend she continue to gradually increase her activity especially with low impact activity such as walking elliptical or stationary bike for exercise which she will begin doing.  May continue regular shoe gear.  Discussed possibility of refracture and to avoid areas of unstable ground for now.  We declined x-rays today due to her new pregnancy status, did not feel it would change her management at this point.  I discussed with her if it begins to swell or cause pain again to return for an x-ray  Return if symptoms worsen or fail to improve.

## 2022-08-25 ENCOUNTER — Encounter (HOSPITAL_COMMUNITY): Payer: Self-pay

## 2022-08-25 ENCOUNTER — Ambulatory Visit (HOSPITAL_COMMUNITY)
Admission: EM | Admit: 2022-08-25 | Discharge: 2022-08-25 | Disposition: A | Discharge status: Home / Self Care | Payer: Medicaid Other

## 2022-08-25 DIAGNOSIS — J069 Acute upper respiratory infection, unspecified: Secondary | ICD-10-CM | POA: Diagnosis not present

## 2022-08-25 DIAGNOSIS — B9689 Other specified bacterial agents as the cause of diseases classified elsewhere: Secondary | ICD-10-CM | POA: Diagnosis not present

## 2022-08-25 DIAGNOSIS — R051 Acute cough: Secondary | ICD-10-CM

## 2022-08-25 MED ORDER — AMOXICILLIN 500 MG PO CAPS: 500.0000 mg | ORAL_CAPSULE | Freq: Three times a day (TID) | ORAL | 0 refills | Status: DC

## 2022-08-25 NOTE — Discharge Instructions (Signed)
Your symptoms appear consistent with a bacterial upper respiratory infection.  I prescribed you amoxicillin, please take this 3 times daily until finished.  You can take Mucinex for your congestion, this is safe during pregnancy.  Please ensure you are drinking at least 64 ounces of water per day.  Please also sleep with a humidifier.  Attached is a list of medications safe to take during pregnancy.  Please return to clinic or follow-up with your primary care if no improvement.

## 2022-08-25 NOTE — ED Triage Notes (Signed)
Patient here today for cough and ST X 1.5 weeks. Patient is [redacted] weeks pregnant. She does have some SOB throughout the day and has productive cough. Yellowish phlegm. 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

## 2022-08-25 NOTE — ED Provider Notes (Signed)
[place]    CSN: [id number] Arrival date & time: 08/25/22  1007      History   Chief Complaint Chief Complaint  Patient presents with   Cough    HPI Pamela Erickson is a 32 y.o. female.   Reports cough, sore throat about 1.5 weeks, now cough is productive with yellow phglem, reports mild SOB after coughing fit. Denies chest pain, N/V/D. Has not been taking anything for this, as she was not sure what to take, she os [redacted] weeks pregnant.   The history is provided by the patient.  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   Sjogren's syndrome ([place])    Torn ACL (anterior cruciate ligament) 2008   Vaginal Pap smear, abnormal    May 2018; biopsy was normal    Patient Active Problem List   Diagnosis Date Noted   Abscess 03/26/2022   Bacterial vaginosis 03/26/2022   Infection due to Enterococcus 03/26/2022   Postpartum state 03/26/2022   Ultrasound scan abnormal 03/26/2022   Urinary tract infectious disease 03/26/2022   Vaginal discharge 03/26/2022   Cystic acne 10/23/2020   Obesity (BMI 35.0-39.9 without comorbidity) 04/20/2020   VBAC, delivered 09/19/2018   Postpartum hemorrhage 09/17/2018   Anemia of pregnancy 06/29/2018   [redacted] weeks gestation of pregnancy 05/21/2018   Drug therapy 03/17/2018   Sjogren's syndrome with keratoconjunctivitis sicca ([place]) 02/27/2018   Morbid obesity ([employer]) 02/27/2018   Other specified abnormal immunological findings in serum 12/16/2017   Sickle cell trait ([place]) 08/01/2017   History of cesarean section 07/20/2017   Anemia 06/08/2013   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 Authorizing Provider  amoxicillin (AMOXIL) 500 MG capsule Take 1 capsule (500 mg total) by mouth 3 (three) times daily. 08/25/22  Yes Louretta Shorten, [place] N, FNP  hydroxychloroquine (PLAQUENIL) 200 MG tablet Take 400 mg by mouth daily. Take 2 tablets daily   Yes [provider]  Prenatal MV & Min w/FA-DHA (PRENATAL ADULT GUMMY/DHA/FA PO) Take by mouth daily.   Yes [provider]  valACYclovir (VALTREX) 500 MG tablet Take 500 mg by mouth at bedtime.   Yes [provider]    Family History Family History  Problem Relation Age of Onset   Hypertension Mother    Hypertension Father  Diabetes Maternal Aunt    Hypertension Maternal Grandmother    Hypertension Maternal Grandfather     Social History Social History   Tobacco Use   Smoking status: Never   Smokeless tobacco: Never  Vaping Use   Vaping Use: Never used  Substance Use Topics   Alcohol use: No    Comment: OCC   Drug use: Not Currently    Types: Marijuana    Comment: OCC LAST USE 06/2012     Allergies   Cephalexin   Review of Systems Review of Systems  Constitutional:  Negative for chills and diaphoresis.  HENT:  Positive for sore throat.   Respiratory:  Positive for cough. Negative for shortness of breath.   Cardiovascular:  Negative for chest pain.  Gastrointestinal:  Negative for abdominal pain, diarrhea, nausea and vomiting.  Genitourinary:  Negative for dysuria and hematuria.     Physical Exam Triage Vital Signs ED  Triage Vitals  Enc Vitals Group     BP 08/25/22 1102 120/78     Pulse Rate 08/25/22 1102 92     Resp 08/25/22 1102 16     Temp 08/25/22 1102 97.9 F (36.6 C)     Temp Source 08/25/22 1102 Oral     SpO2 08/25/22 1102 98 %     Weight 08/25/22 1101 220 lb (99.8 kg)     Height 08/25/22 1101 5' 2"$  (1.575 m)     Head Circumference --      Peak Flow --      Pain Score 08/25/22 1056 7     Pain Loc --      Pain Edu? --      Excl. in [place]? --    No data found.  Updated Vital Signs BP 120/78 (BP Location: Left Arm)   Pulse 92   Temp 97.9 F (36.6 C) (Oral)   Resp 16   Ht 5' 2"$  (1.575 m)   Wt 220 lb (99.8 kg)   LMP 05/25/2022 (Approximate)   SpO2 98%   Breastfeeding No   BMI 40.24 kg/m   Visual Acuity Right Eye Distance:   Left Eye Distance:   Bilateral Distance:    Right Eye Near:   Left Eye Near:    Bilateral Near:     Physical Exam Vitals and nursing note reviewed.  Constitutional:      Appearance: Normal appearance.     Comments: Pleasant 32 year old female who appears stated age.  HENT:     Head: Normocephalic and atraumatic.     Right Ear: External ear normal.     Left Ear: External ear normal.     Nose: Congestion present.     Mouth/Throat:     Mouth: Mucous membranes are moist.     Pharynx: Uvula midline. Posterior oropharyngeal erythema present. No oropharyngeal exudate or uvula swelling.     Tonsils: No tonsillar exudate or tonsillar abscesses.  Eyes:     Pupils: Pupils are equal, round, and reactive to light.  Cardiovascular:     Rate and Rhythm: Normal rate and regular rhythm.     Pulses: Normal pulses.     Heart sounds: Normal heart sounds, S1 normal and S2 normal.  Pulmonary:     Effort: Pulmonary effort is normal.     Breath sounds: Normal breath sounds.     Comments: Lungs vesicular posteriorly. Neurological:     Mental Status: She is alert.  Psychiatric:        Behavior: Behavior is cooperative.  UC Treatments / Results  Labs (all  labs ordered are listed, but only abnormal results are displayed) Labs Reviewed - No data to display  EKG   Radiology No results found.  Procedures Procedures (including critical care time)  Medications Ordered in UC Medications - No data to display  Initial Impression / Assessment and Plan / UC Course  I have reviewed the triage vital signs and the nursing notes.  Pertinent labs & imaging results that were available during my care of the patient were reviewed by me and considered in my medical decision making (see chart for details).   Vital signs and nursing note reviewed.  Patient is hemodynamically stable.  Patient is [redacted] weeks pregnant.  Due to duration of illness and symptoms, will cover with amoxicillin for bacterial upper respiratory infection.  Advised symptomatic management with Tylenol and Mucinex.  Provided with medication list that is safe during pregnancy.  Return precautions and follow-up care discussed, patient verbalized understanding.    Final Clinical Impressions(s) / UC Diagnoses   Final diagnoses:  Bacterial upper respiratory infection  Acute cough     Discharge Instructions      Your symptoms appear consistent with a bacterial upper respiratory infection.  I prescribed you amoxicillin, please take this 3 times daily until finished.  You can take Mucinex for your congestion, this is safe during pregnancy.  Please ensure you are drinking at least 64 ounces of water per day.  Please also sleep with a humidifier.  Attached is a list of medications safe to take during pregnancy.  Please return to clinic or follow-up with your primary care if no improvement.     ED Prescriptions     Medication Sig Dispense Auth. Provider   amoxicillin (AMOXIL) 500 MG capsule Take 1 capsule (500 mg total) by mouth 3 (three) times daily. 21 capsule Kortne All, [place], [place]      I have reviewed the PDMP during this encounter.   Amariana Mirando, [place], [place] 08/25/22 1126

## 2022-08-30 ENCOUNTER — Encounter (HOSPITAL_COMMUNITY): Payer: Self-pay

## 2022-08-30 ENCOUNTER — Ambulatory Visit (HOSPITAL_COMMUNITY)
Admission: EM | Admit: 2022-08-30 | Discharge: 2022-08-30 | Disposition: A | Discharge status: Home / Self Care | Payer: Medicaid Other | Attending: Family Medicine | Admitting: Family Medicine

## 2022-08-30 DIAGNOSIS — O99512 Diseases of the respiratory system complicating pregnancy, second trimester: Secondary | ICD-10-CM | POA: Diagnosis not present

## 2022-08-30 DIAGNOSIS — J0100 Acute maxillary sinusitis, unspecified: Secondary | ICD-10-CM | POA: Diagnosis not present

## 2022-08-30 DIAGNOSIS — Z3A14 14 weeks gestation of pregnancy: Secondary | ICD-10-CM

## 2022-08-30 MED ORDER — AZITHROMYCIN 250 MG PO TABS: 250.0000 mg | ORAL_TABLET | Freq: Every day | ORAL | 0 refills | Status: DC

## 2022-08-30 NOTE — Discharge Instructions (Addendum)
You were seen today for a sinus infection.  Please stop the amoxicillin and start the zpack given today. You may also use over the counter claritin or zyrtec for sinus congestion/drainage.  Please return if not improving.   As you are pregnant, if you have any emergent issues with bleeding, spotting or cramping please call your ob/gyn, or [facility], which I have given you information for.

## 2022-08-30 NOTE — ED Triage Notes (Signed)
Patient reports that she has had a productive cough with green sputum and streaks of blood nasal congestion with green and streaks of blood x 2 weeks. Patient states she was seen 5 days ago for the same and has been taking Amoxicillin, but symptoms have worsened.  Patient added that she is [redacted] weeks pregnant and has been having abdominal cramping since yesterday.

## 2022-08-30 NOTE — ED Provider Notes (Signed)
[facility]    CSN: [id number] Arrival date & time: 08/30/22  [id number]      History   Chief Complaint Chief Complaint  Patient presents with   Cough   Abdominal Pain   Nasal Congestion    HPI Pamela Erickson is a 32 y.o. female.   Patient is here for uri symptoms x 2 weeks.  Sinus congestion, drainage.  Some sinus pain/pressure.  Also with cough.   No wheezing or sob noted.  She did have chills earlier in the week, but not recently.  Was seen 5 days ago, given amoxicillin, and symptoms are worsening.  Having more green sputum with her cough, blood tinged sputum.  She is [redacted] weeks pregnant.        Past Medical History:  Diagnosis Date   Abnormal Pap smear    last pap 01/2012   Anemia    During pregnancy   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   Sjogren's syndrome ([place])    Torn ACL (anterior cruciate ligament) 2008   Vaginal Pap smear, abnormal    May 2018; biopsy was normal    Patient Active Problem List   Diagnosis Date Noted   Abscess 03/26/2022   Bacterial vaginosis 03/26/2022   Infection due to Enterococcus 03/26/2022   Postpartum state 03/26/2022   Ultrasound scan abnormal 03/26/2022   Urinary tract infectious disease 03/26/2022   Vaginal discharge 03/26/2022   Cystic acne 10/23/2020   Obesity (BMI 35.0-39.9 without comorbidity) 04/20/2020   VBAC, delivered 09/19/2018   Postpartum hemorrhage 09/17/2018   Anemia of pregnancy 06/29/2018   [redacted] weeks gestation of pregnancy 05/21/2018   Drug therapy 03/17/2018   Sjogren's syndrome with keratoconjunctivitis sicca ([place]) 02/27/2018   Morbid obesity ([place]) 02/27/2018   Other specified abnormal immunological findings in serum 12/16/2017   Sickle cell trait ([place]) 08/01/2017   History of cesarean section 07/20/2017   Anemia 06/08/2013   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 Authorizing Provider  amoxicillin (AMOXIL) 500 MG capsule Take 1 capsule (500 mg total) by mouth 3 (three) times daily. 08/25/22   Garrison, [place] N, FNP  hydroxychloroquine (PLAQUENIL) 200 MG tablet Take 400 mg by mouth daily. Take 2 tablets daily    [provider]  Prenatal MV & Min w/FA-DHA (PRENATAL ADULT GUMMY/DHA/FA PO) Take by mouth daily.    [provider]  valACYclovir (VALTREX) 500 MG tablet Take 500 mg by mouth at bedtime.    [provider]    Family History Family History  Problem Relation Age of Onset   Hypertension Mother  Hypertension Father    Diabetes Maternal Aunt    Hypertension Maternal Grandmother    Hypertension Maternal Grandfather     Social History Social History   Tobacco Use   Smoking status: Never   Smokeless tobacco: Never  Vaping Use   Vaping Use: Never used  Substance Use Topics   Alcohol use: Not Currently    Comment: OCC   Drug use: Not Currently    Types: Marijuana    Comment: OCC LAST USE 06/2012     Allergies   Cephalexin   Review of Systems Review of Systems  Constitutional:  Negative for chills and fever.  HENT:  Positive for congestion, rhinorrhea and sinus pressure.   Respiratory:  Positive for cough.   Gastrointestinal:  Positive for abdominal pain.  Musculoskeletal: Negative.   Psychiatric/Behavioral: Negative.       Physical Exam Triage Vital Signs ED Triage Vitals  Enc Vitals Group     BP 08/30/22 0942 123/82     Pulse Rate 08/30/22  0942 (!) 109     Resp 08/30/22 0942 16     Temp 08/30/22 0942 98.9 F (37.2 C)     Temp Source 08/30/22 0942 Oral     SpO2 08/30/22 0942 98 %     Weight --      Height --      Head Circumference --      Peak Flow --      Pain Score 08/30/22 0941 6     Pain Loc --      Pain Edu? --      Excl. in [place]? --    No data found.  Updated Vital Signs BP 123/82 (BP Location: Right Arm)   Pulse (!) 109   Temp 98.9 F (37.2 C) (Oral)   Resp 16   LMP 05/25/2022 (Approximate)   SpO2 98%   Visual Acuity Right Eye Distance:   Left Eye Distance:   Bilateral Distance:    Right Eye Near:   Left Eye Near:    Bilateral Near:     Physical Exam Constitutional:      Appearance: Normal appearance.  HENT:     Right Ear: Tympanic membrane normal.     Left Ear: Tympanic membrane normal.     Nose: Congestion present.     Right Sinus: Maxillary sinus tenderness present.     Left Sinus: Maxillary sinus tenderness present.  Cardiovascular:     Rate and Rhythm: Normal rate and regular rhythm.  Pulmonary:     Effort: Pulmonary effort is normal.     Breath sounds: Normal breath sounds.  Musculoskeletal:     Cervical back: Normal range of motion and neck supple. No tenderness.  Lymphadenopathy:     Cervical: No cervical adenopathy.  Skin:    General: Skin is warm.  Neurological:     General: No focal deficit present.     Mental Status: She is alert.  Psychiatric:        Mood and Affect: Mood normal.      UC Treatments / Results  Labs (all labs ordered are listed, but only abnormal results are displayed) Labs Reviewed - No data to display  EKG   Radiology No results found.  Procedures Procedures (including critical care time)  Medications Ordered in UC Medications - No data to display  Initial Impression / Assessment and Plan / UC Course  I have reviewed the triage vital signs and the nursing notes.  Pertinent labs & imaging  results that were available during my care of  the patient were reviewed by me and considered in my medical decision making (see chart for details).   Final Clinical Impressions(s) / UC Diagnoses   Final diagnoses:  Acute non-recurrent maxillary sinusitis     Discharge Instructions      You were seen today for a sinus infection.  Please stop the amoxicillin and start the zpack given today. You may also use over the counter claritin or zyrtec for sinus congestion/drainage.  Please return if not improving.   As you are pregnant, if you have any emergent issues with bleeding, spotting or cramping please call your ob/gyn, or [facility], which I have given you information for.     ED Prescriptions     Medication Sig Dispense Auth. Provider   azithromycin (ZITHROMAX) 250 MG tablet Take 1 tablet (250 mg total) by mouth daily. Take first 2 tablets together, then 1 every day until finished. 6 tablet Rondel Oh, MD      PDMP not reviewed this encounter.   Rondel Oh, MD 08/30/22 1000

## 2022-09-27 ENCOUNTER — Other Ambulatory Visit: Payer: Self-pay

## 2022-09-27 ENCOUNTER — Encounter: Payer: Self-pay | Admitting: *Deleted

## 2022-09-30 LAB — OB RESULTS CONSOLE GC/CHLAMYDIA
Chlamydia: NEGATIVE
Neisseria Gonorrhea: NEGATIVE

## 2022-10-01 ENCOUNTER — Ambulatory Visit: Payer: Medicaid Other | Admitting: Nurse Practitioner

## 2022-10-01 ENCOUNTER — Encounter: Payer: Self-pay | Admitting: Nurse Practitioner

## 2022-10-01 VITALS — BP 108/70 | HR 98 | Temp 98.2°F | Ht 62.0 in | Wt 224.2 lb

## 2022-10-01 DIAGNOSIS — Z3A18 18 weeks gestation of pregnancy: Secondary | ICD-10-CM

## 2022-10-01 DIAGNOSIS — D573 Sickle-cell trait: Secondary | ICD-10-CM | POA: Diagnosis not present

## 2022-10-01 DIAGNOSIS — E559 Vitamin D deficiency, unspecified: Secondary | ICD-10-CM

## 2022-10-01 DIAGNOSIS — M3501 Sicca syndrome with keratoconjunctivitis: Secondary | ICD-10-CM | POA: Diagnosis not present

## 2022-10-01 DIAGNOSIS — Z6841 Body Mass Index (BMI) 40.0 and over, adult: Secondary | ICD-10-CM

## 2022-10-01 NOTE — Progress Notes (Signed)
I,Pamela Erickson,acting as a Neurosurgeonscribe for Pamela FeltsJanece Amanada Philbrick, FNP.,have documented all relevant documentation on the behalf of Pamela FeltsJanece Jontavia Leatherbury, FNP,as directed by  Pamela FeltsJanece Artelia Game, FNP while in the presence of Pamela FeltsJanece Ronneisha Jett, FNP.    Subjective:     Patient ID: Pamela Erickson , female    DOB: Jun 11, 1991 , 32 y.o.   MRN: [id number]   Chief Complaint  Patient presents with   Pregnancy   Medical Management of Chronic Issues    HPI  Patient presents today for follow up .Patient is currently pregnant with EDD of 03/01/23. Patient has no other complaints or concerns.   She is taking 4,000 units per day from the OB/GYN.      Past Medical History:  Diagnosis Date   Abnormal Pap smear    last pap 01/2012   Anemia    During pregnancy   BV (bacterial vaginosis)    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   Low vitamin D level    Sjogren's syndrome    Torn ACL (anterior cruciate ligament) 2008   Trichomonas infection    UTI (urinary tract infection)    Vaginal Pap smear, abnormal    May 2018; biopsy was normal     Family History  Problem Relation Age of Onset   Hypertension Mother    Hypertension Father    Diabetes Maternal Aunt    Hypertension Maternal Grandmother    Hypertension Maternal Grandfather      Current Outpatient Medications:    hydroxychloroquine (PLAQUENIL) 200 MG tablet, Take 400 mg by mouth daily. Take 2 tablets daily, Disp: , Rfl:    Prenatal MV & Min w/FA-DHA (PRENATAL ADULT GUMMY/DHA/FA PO), Take by mouth daily., Disp: , Rfl:    SODIUM FLUORIDE, DENTAL RINSE, 0.2 % SOLN, Take by mouth., Disp: , Rfl:    valACYclovir (VALTREX) 500 MG tablet, Take 500 mg by mouth at bedtime., Disp: , Rfl:    aspirin EC 81 MG tablet, Take 81 mg by mouth daily. Swallow whole., Disp: , Rfl:    VITAMIN D PO, Take by mouth., Disp: , Rfl:    Allergies  Allergen Reactions   Cephalexin     Other reaction(s): other   Ibuprofen Rash    On hands     Review of Systems   Constitutional: Negative.   HENT: Negative.    Eyes: Negative.   Respiratory: Negative.    Cardiovascular: Negative.   Gastrointestinal: Negative.   Endocrine: Negative.   Genitourinary: Negative.   Musculoskeletal: Negative.   Skin: Negative.   Allergic/Immunologic: Negative.   Neurological: Negative.   Hematological: Negative.   Psychiatric/Behavioral: Negative.       Today's Vitals   10/01/22 1451  BP: 108/70  Pulse: 98  Temp: 98.2 F (36.8 C)  TempSrc: Oral  SpO2: 98%  Weight: 224 lb 3.2 oz (101.7 kg)  Height: 5\' 2"  (1.575 m)   Body mass index is 41.01 kg/m.   Objective:  Physical Exam Vitals reviewed.  Constitutional:      General: She is not in acute distress.    Appearance: Normal appearance. She is obese.  Pulmonary:     Effort: Pulmonary effort is normal. No respiratory distress.  Musculoskeletal:        General: Normal range of motion.  Neurological:     General: No focal deficit present.     Mental Status: She is alert and oriented to person, place, and time.  Cranial Nerves: No cranial nerve deficit.     Motor: No weakness.  Psychiatric:        Mood and Affect: Mood normal.        Behavior: Behavior normal.        Thought Content: Thought content normal.        Judgment: Judgment normal.         Assessment And Plan:     1. Vitamin D deficiency Comments: She is on Vitamin D 4,000 units continue and will have [facility]  2. Sjogren's syndrome with keratoconjunctivitis sicca (HCC) Comments: She is back on the Plaquenil 2 tabs a day due to being pregnant.  3. Sickle cell trait ([facility]) Comments: No issues.  4. BMI 40.0-44.9, adult [facility]CC) Comments: Currently pregnant, she is to continue close f/u with OB  5. [redacted] weeks gestation of pregnancy Currently pregnant [redacted] weeks, continue f/u with OB   Patient was given opportunity to ask questions. Patient verbalized understanding of the plan and was able to repeat key  elements of the plan. All questions were answered to their satisfaction.  Pamela FeltsJanece Angelia Hazell, FNP   I, Pamela FeltsJanece Zyria Fiscus, FNP, have reviewed all documentation for this visit. The documentation on 10/01/22 for the exam, diagnosis, procedures, and orders are all accurate and complete.   IF YOU HAVE BEEN REFERRED TO A SPECIALIST, IT MAY TAKE 1-2 WEEKS TO SCHEDULE/PROCESS THE REFERRAL. IF YOU HAVE NOT HEARD FROM US/SPECIALIST IN TWO WEEKS, PLEASE GIVE US A CALL AT [phone].   THE PATIENT IS ENCOURAGED TO PRACTICE SOCIAL DISTANCING DUE TO THE COVID-19 PANDEMIC.

## 2022-10-03 ENCOUNTER — Encounter: Payer: Self-pay | Admitting: *Deleted

## 2022-10-03 ENCOUNTER — Ambulatory Visit: Payer: Medicaid Other | Admitting: *Deleted

## 2022-10-03 ENCOUNTER — Ambulatory Visit (HOSPITAL_BASED_OUTPATIENT_CLINIC_OR_DEPARTMENT_OTHER): Payer: Medicaid Other | Admitting: Obstetrics and Gynecology

## 2022-10-03 ENCOUNTER — Other Ambulatory Visit: Payer: Self-pay | Admitting: *Deleted

## 2022-10-03 ENCOUNTER — Ambulatory Visit: Payer: Medicaid Other | Attending: Obstetrics and Gynecology

## 2022-10-03 VITALS — BP 110/62 | HR 99

## 2022-10-03 DIAGNOSIS — D573 Sickle-cell trait: Secondary | ICD-10-CM | POA: Diagnosis not present

## 2022-10-03 DIAGNOSIS — Z363 Encounter for antenatal screening for malformations: Secondary | ICD-10-CM | POA: Diagnosis not present

## 2022-10-03 DIAGNOSIS — Z3689 Encounter for other specified antenatal screening: Secondary | ICD-10-CM | POA: Insufficient documentation

## 2022-10-03 DIAGNOSIS — M3500 Sicca syndrome, unspecified: Secondary | ICD-10-CM

## 2022-10-03 DIAGNOSIS — O99019 Anemia complicating pregnancy, unspecified trimester: Secondary | ICD-10-CM

## 2022-10-03 DIAGNOSIS — Z3A18 18 weeks gestation of pregnancy: Secondary | ICD-10-CM | POA: Diagnosis not present

## 2022-10-03 DIAGNOSIS — O34219 Maternal care for unspecified type scar from previous cesarean delivery: Secondary | ICD-10-CM

## 2022-10-03 DIAGNOSIS — O99212 Obesity complicating pregnancy, second trimester: Secondary | ICD-10-CM | POA: Diagnosis not present

## 2022-10-03 DIAGNOSIS — Z362 Encounter for other antenatal screening follow-up: Secondary | ICD-10-CM

## 2022-10-03 NOTE — Progress Notes (Signed)
Maternal-Fetal Medicine   Name: Pamela Erickson DOB: 1990-08-16 MRN: [id number] Referring Provider: Rex Kras Hands, NP  I had the pleasure of seeing Pamela Erickson today at the [facility]. She is G4 P2102 at [place] gestation and is here for fetal anatomy scan and consultation.  She was accompanied by her partner.  Past medical history is significant for Sjogren's syndrome diagnosed in her second pregnancy when congenital heart block was detected. Patient has been having dryness of mouth and had seen a rheumatologist.  She is being followed by her rheumatologist at [facility]. She does not have hypertension or diabetes.  She has sickle cell trait.  Obstetric history 04/2013: Term cesarean delivery of a female infant weighing 7 pounds and 9 ounces at birth.  Cesarean section was performed because of failure to progress in labor. 07/2017: [redacted] weeks gestation, fetal bradycardia was noted and congenital heart block was diagnosed.  Patient reports she was transferred to [facility].  Subsequently, she had intrauterine fetal death and had induction of labor.  She delivered a stillborn female infant. 09/2018: Term vaginal delivery of a female infant weighing 8 pounds and 2 ounces at birth.  Her pregnancy was uncomplicated and fetal echocardiography was performed.  No evidence of congenital heart block or neonatal lupus.  GYN history: Patient reports she had cervical biopsy but no LEEP or cone biopsy procedure.  No history of breast biopsies. Medications: Prenatal vitamins, hydroxychloroquine, low-dose aspirin, vitamin D. Allergies: Cephalexin (rashes-hands). Social history: Denies tobacco or drug or alcohol use.  She lives with a partner.  Her partner does not have sickle cell trait. Family history: No history of venous thromboembolism in the family.  Ultrasound We performed a fetal anatomical survey.  No markers of aneuploidy's or fetal structural  defects are seen.  Fetal biometry is consistent with the previously established dates.  Amniotic fluid is normal and good fetal activity seen.  Fetal heart rate and rhythm appear normal.  Cardiac anatomy is difficult to evaluate because of fetal position. As maternal obesity imposes limitations on the resolution of ultrasound images, fetal anomalies may be missed.  Sjogren's Syndrome in Pregnancy Patient has primary Sjogren's syndrome. Studies regarding pregnancy outcomes have not been consistent.  Overall, an increased risk of miscarriage, preterm birth, fetal growth restriction, preeclampsia, stillbirth, and low birth weights were reported. A slight increase in congenital malformations was reported in some studies. In the absence of active disease, we should expect good pregnancy outcomes.  I reassured the patient that hydroxychloroquine can be safely taken in pregnancy.  It reduces the likelihood of developing congenital heart block.   Anti-SSA (Ro) antibodies: Patient has increased anti-SSA (Ro) antibodies. Ro antibodies lead more commonly to cutaneous manifestations of neonatal lupus syndrome and less commonly cardiac manifestations including congenital heart block.  Neonatal cutaneous manifestations include scaling, plaque formation and hypopigmentation all of which usually resolve over time. Maternal antibodies are IgG Ro antibodies reactive to Ro and La antigens.     In pregnancy transplacental passage of these antibodies can react with fetal cardiomyocytes and cause inflammation.  Depending on the genetic predisposition and the susceptibility in the fetus, inflammatory reaction can lead to fibrosis and defective conduction mechanism.  Initially, they manifest as first-degree heart block leading to second or third degree heart block during the course of pregnancy.  Women with both Ro and La antibodies are slightly more likely to develop congenital heart block.  Overall risk is estimated to be less  than 5%.  Since she had a previous pregnancy with fetal congenital heart block, her risk of having an affected fetus in this pregnancy is about 15% to 20%.   Serial monitoring (weekly) to identify prolonged PR interval (so that treatment with steroids could be initiated) has not been found to be useful. Some centers prefer monitoring PR interval and begin dexamethasone treatment if first- or second-degree heart block is detected.   I recommended fetal echocardiography. I reassured the couple of normal fetal heart rate and rhythm.  Previous cesarean delivery Patient had a successful VBAC.  I reassured her that her chances of having vaginal delivery is high.  Repeat cesarean deliveries increase the likelihood of placenta previa or placenta accreta spectrum.  Recommendations -An appointment was made for her to return in 4 weeks for completion of fetal anatomy. -Fetal growth assessments every 4 weeks. -We have requested an appointment for fetal echocardiography (pediatric cardiology, [facility])  Thank you for consultation.  If you have any questions or concerns, please contact me the [facility].  Consultation including face-to-face (more than 50%) counseling 45 minutes.

## 2022-10-12 ENCOUNTER — Encounter: Payer: Self-pay | Admitting: Nurse Practitioner

## 2022-10-29 DIAGNOSIS — Z8759 Personal history of other complications of pregnancy, childbirth and the puerperium: Secondary | ICD-10-CM | POA: Insufficient documentation

## 2022-10-31 ENCOUNTER — Other Ambulatory Visit: Payer: Self-pay | Admitting: *Deleted

## 2022-10-31 ENCOUNTER — Ambulatory Visit: Payer: Medicaid Other | Admitting: *Deleted

## 2022-10-31 ENCOUNTER — Ambulatory Visit: Payer: Medicaid Other | Attending: Obstetrics and Gynecology

## 2022-10-31 VITALS — BP 111/60 | HR 99

## 2022-10-31 DIAGNOSIS — O09292 Supervision of pregnancy with other poor reproductive or obstetric history, second trimester: Secondary | ICD-10-CM | POA: Diagnosis not present

## 2022-10-31 DIAGNOSIS — D573 Sickle-cell trait: Secondary | ICD-10-CM | POA: Diagnosis not present

## 2022-10-31 DIAGNOSIS — Z3A22 22 weeks gestation of pregnancy: Secondary | ICD-10-CM | POA: Diagnosis not present

## 2022-10-31 DIAGNOSIS — O99019 Anemia complicating pregnancy, unspecified trimester: Secondary | ICD-10-CM | POA: Diagnosis not present

## 2022-10-31 DIAGNOSIS — O99212 Obesity complicating pregnancy, second trimester: Secondary | ICD-10-CM

## 2022-10-31 DIAGNOSIS — M3500 Sicca syndrome, unspecified: Secondary | ICD-10-CM | POA: Insufficient documentation

## 2022-10-31 DIAGNOSIS — O34219 Maternal care for unspecified type scar from previous cesarean delivery: Secondary | ICD-10-CM | POA: Diagnosis not present

## 2022-10-31 DIAGNOSIS — O99891 Other specified diseases and conditions complicating pregnancy: Secondary | ICD-10-CM

## 2022-10-31 DIAGNOSIS — E669 Obesity, unspecified: Secondary | ICD-10-CM | POA: Diagnosis not present

## 2022-10-31 DIAGNOSIS — Z8759 Personal history of other complications of pregnancy, childbirth and the puerperium: Secondary | ICD-10-CM | POA: Insufficient documentation

## 2022-10-31 DIAGNOSIS — Z362 Encounter for other antenatal screening follow-up: Secondary | ICD-10-CM | POA: Insufficient documentation

## 2022-11-15 DIAGNOSIS — Z3A24 24 weeks gestation of pregnancy: Secondary | ICD-10-CM | POA: Diagnosis not present

## 2022-11-15 DIAGNOSIS — M3500 Sicca syndrome, unspecified: Secondary | ICD-10-CM | POA: Diagnosis not present

## 2022-11-15 DIAGNOSIS — O26892 Other specified pregnancy related conditions, second trimester: Secondary | ICD-10-CM | POA: Diagnosis not present

## 2022-11-15 DIAGNOSIS — Z8249 Family history of ischemic heart disease and other diseases of the circulatory system: Secondary | ICD-10-CM | POA: Diagnosis not present

## 2022-11-15 DIAGNOSIS — R768 Other specified abnormal immunological findings in serum: Secondary | ICD-10-CM | POA: Diagnosis not present

## 2022-11-25 DIAGNOSIS — Z3403 Encounter for supervision of normal first pregnancy, third trimester: Secondary | ICD-10-CM | POA: Diagnosis not present

## 2022-11-26 LAB — OB RESULTS CONSOLE HIV ANTIBODY (ROUTINE TESTING): HIV: NONREACTIVE

## 2022-12-03 ENCOUNTER — Encounter: Payer: Self-pay | Admitting: *Deleted

## 2022-12-05 ENCOUNTER — Ambulatory Visit: Payer: Medicaid Other | Attending: Maternal & Fetal Medicine

## 2022-12-05 ENCOUNTER — Other Ambulatory Visit: Payer: Self-pay | Admitting: *Deleted

## 2022-12-05 DIAGNOSIS — Z8759 Personal history of other complications of pregnancy, childbirth and the puerperium: Secondary | ICD-10-CM | POA: Diagnosis not present

## 2022-12-05 DIAGNOSIS — O99212 Obesity complicating pregnancy, second trimester: Secondary | ICD-10-CM | POA: Diagnosis not present

## 2022-12-05 DIAGNOSIS — O34219 Maternal care for unspecified type scar from previous cesarean delivery: Secondary | ICD-10-CM

## 2022-12-05 DIAGNOSIS — M3500 Sicca syndrome, unspecified: Secondary | ICD-10-CM

## 2022-12-05 DIAGNOSIS — O99891 Other specified diseases and conditions complicating pregnancy: Secondary | ICD-10-CM

## 2022-12-05 DIAGNOSIS — O09292 Supervision of pregnancy with other poor reproductive or obstetric history, second trimester: Secondary | ICD-10-CM

## 2022-12-05 DIAGNOSIS — Z3A27 27 weeks gestation of pregnancy: Secondary | ICD-10-CM | POA: Diagnosis not present

## 2022-12-05 DIAGNOSIS — D573 Sickle-cell trait: Secondary | ICD-10-CM

## 2022-12-05 DIAGNOSIS — E669 Obesity, unspecified: Secondary | ICD-10-CM | POA: Diagnosis not present

## 2022-12-10 ENCOUNTER — Encounter (HOSPITAL_COMMUNITY): Payer: Self-pay | Admitting: Obstetrics and Gynecology

## 2022-12-10 ENCOUNTER — Inpatient Hospital Stay (HOSPITAL_COMMUNITY)
Admission: AD | Admit: 2022-12-10 | Discharge: 2022-12-10 | Disposition: A | Discharge status: Home / Self Care | Payer: Medicaid Other | Attending: Obstetrics and Gynecology | Admitting: Obstetrics and Gynecology

## 2022-12-10 ENCOUNTER — Other Ambulatory Visit: Payer: Self-pay

## 2022-12-10 DIAGNOSIS — Z3A28 28 weeks gestation of pregnancy: Secondary | ICD-10-CM | POA: Diagnosis not present

## 2022-12-10 DIAGNOSIS — J069 Acute upper respiratory infection, unspecified: Secondary | ICD-10-CM | POA: Diagnosis not present

## 2022-12-10 DIAGNOSIS — Z1152 Encounter for screening for COVID-19: Secondary | ICD-10-CM | POA: Insufficient documentation

## 2022-12-10 DIAGNOSIS — O26893 Other specified pregnancy related conditions, third trimester: Secondary | ICD-10-CM | POA: Insufficient documentation

## 2022-12-10 DIAGNOSIS — R519 Headache, unspecified: Secondary | ICD-10-CM | POA: Insufficient documentation

## 2022-12-10 DIAGNOSIS — O99513 Diseases of the respiratory system complicating pregnancy, third trimester: Secondary | ICD-10-CM | POA: Insufficient documentation

## 2022-12-10 DIAGNOSIS — R0602 Shortness of breath: Secondary | ICD-10-CM | POA: Diagnosis present

## 2022-12-10 LAB — SARS CORONAVIRUS 2 BY RT PCR: SARS Coronavirus 2 by RT PCR: NEGATIVE

## 2022-12-10 MED ORDER — PROMETHAZINE-DM 6.25-15 MG/5ML PO SYRP
5.0000 mL | ORAL_SOLUTION | Freq: Every evening | ORAL | 0 refills | Status: DC | PRN
Start: 2022-12-10 — End: 2023-02-25

## 2022-12-10 MED ORDER — ACETAMINOPHEN-CAFFEINE 500-65 MG PO TABS
2.0000 | ORAL_TABLET | Freq: Once | ORAL | Status: AC
  Administered 2022-12-10: 2 via ORAL
  Filled 2022-12-10: qty 2

## 2022-12-10 MED ORDER — GUAIFENESIN ER 600 MG PO TB12
1200.0000 mg | ORAL_TABLET | Freq: Once | ORAL | Status: AC
  Administered 2022-12-10: 1200 mg via ORAL
  Filled 2022-12-10: qty 2

## 2022-12-10 NOTE — MAU Note (Signed)
Pamela Erickson is a 32 y.o. at [redacted]w[redacted]d here in MAU reporting: she's having SOB, congestion, HA, and chills.  Reports symptoms have worsened over the past week.  Took Tylenol today @ 0730 for HA, no relief noted.  Hasn't done any @ home Covid testing Denies VB or LOF.  Endorses +FM.   LMP: NA Onset of complaint: 1 week Pain score: 7 Vitals:   12/10/22 1826  BP: 114/67  Pulse: (!) 112  Resp: 18  Temp: 98.2 F (36.8 C)  SpO2: 100%     FHT:157 bpm Lab orders placed from triage:   None

## 2022-12-10 NOTE — MAU Provider Note (Signed)
History     CSN: [id number]  Arrival date and time: 12/10/22 1739   Event Date/Time   First Provider Initiated Contact with Patient 12/10/22 1927      Chief Complaint  Patient presents with   Shortness of Breath   Headache   Congestion   Chills   [name] is a 32 y.o. year old G76P2102 female at [redacted]w[redacted]d weeks gestation who presents to MAU reporting SOB, congestion ("mainly in front to face" - sinus area), H/A and chills. She reports these sx's have been on-going for a week. She denies any sick contacts. She took Tylenol this morning at 0730 for the H/A without relief. She rates the H/A a 7/10. She has not taken any COVID test. She receives [facility] with [facility]; next appt is next week.    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   Low vitamin D level    Postpartum hemorrhage 09/17/2018   Sjogren's syndrome (HCC)    Sjogren's syndrome with keratoconjunctivitis sicca (HCC) 02/27/2018   Torn ACL (anterior cruciate ligament) 2008   Trichomonas infection    UTI (urinary tract infection)    Vaginal Pap smear, abnormal    May 2018; biopsy was normal    Past Surgical History:  Procedure Laterality Date   ANTERIOR CRUCIATE LIGAMENT REPAIR  2008   CESAREAN SECTION N/A 04/25/2013   Procedure: CESAREAN SECTION;  Surgeon: Michael Litter, MD;  Location: [facility];  Service: Obstetrics;  Laterality: N/A;   COLPOSCOPY     endocervical curettage  2018    Family History  Problem Relation Age of Onset   Hypertension Mother    Hypertension Father     Diabetes Maternal Aunt    Hypertension Maternal Grandmother    Hypertension Maternal Grandfather     Social History   Tobacco Use   Smoking status: Never   Smokeless tobacco: Never  Vaping Use   Vaping Use: Never used  Substance Use Topics   Alcohol use: Not Currently    Comment: OCC   Drug use: Not Currently    Types: Marijuana    Comment: OCC LAST USE 06/2012    Allergies:  Allergies  Allergen Reactions   Cephalexin     Other reaction(s): other   Ibuprofen Rash    On hands    Medications Prior to Admission  Medication Sig Dispense Refill Last Dose   aspirin EC 81 MG tablet Take 81 mg by mouth daily. Swallow whole.   12/09/2022   hydroxychloroquine (PLAQUENIL) 200 MG tablet Take 400 mg by mouth daily. Take 2 tablets daily   12/09/2022   Prenatal MV & Min w/FA-DHA (PRENATAL ADULT GUMMY/DHA/FA PO) Take by mouth daily.   12/10/2022  valACYclovir (VALTREX) 500 MG tablet Take 500 mg by mouth at bedtime.   Past Week   VITAMIN D PO Take by mouth.   12/10/2022   SODIUM FLUORIDE, DENTAL RINSE, 0.2 % SOLN Take by mouth.       Review of Systems  Constitutional: Negative.   HENT: Negative.    Eyes: Negative.   Respiratory:  Positive for cough (coughing up yellow phlegm) and shortness of breath.   Cardiovascular: Negative.   Gastrointestinal: Negative.   Endocrine: Negative.   Genitourinary: Negative.   Musculoskeletal: Negative.   Skin: Negative.   Allergic/Immunologic: Negative.   Neurological:  Positive for headaches.  Hematological: Negative.   Psychiatric/Behavioral: Negative.     Physical Exam   Blood pressure 114/67, pulse (!) 112, temperature 98.2 F (36.8 C), temperature source Oral, resp. rate 18, height 5\' 2"  (1.575 m), weight 107 kg, last menstrual period 05/25/2022, SpO2 100 %.  Physical Exam Vitals and nursing note reviewed.  Constitutional:      Appearance: Normal appearance. She is obese.  HENT:     Head: Normocephalic and atraumatic.     Nose:  Congestion present.  Cardiovascular:     Rate and Rhythm: Normal rate and regular rhythm.     Pulses: Normal pulses.     Heart sounds: Normal heart sounds.  Pulmonary:     Effort: Pulmonary effort is normal.     Breath sounds: Normal breath sounds.  Abdominal:     General: Bowel sounds are normal.     Palpations: Abdomen is soft.  Genitourinary:    Comments: Not indicated Musculoskeletal:        General: Normal range of motion.  Skin:    General: Skin is warm and dry.  Neurological:     Mental Status: She is alert and oriented to person, place, and time.  Psychiatric:        Mood and Affect: Mood normal.        Behavior: Behavior normal.        Thought Content: Thought content normal.        Judgment: Judgment normal.    REACTIVE NST - FHR: 150 bpm / moderate variability / accels present / decels absent / TOCO: none  MAU Course  Procedures  MDM COVID swab Mucinex DM 1200 mg po  Excedrin Tension H/A tablets  No results found for this or any previous visit (from the past 24 hour(s)).  Assessment and Plan  1. Upper respiratory tract infection, unspecified type - Rx: Promethazine-DM 6.25-15 mg/8mL at hs - Information provided on safe medications in pregnancy list   2. Headache in pregnancy, antepartum, third trimester - Advised to continue taking Tylenol ES 1000 mg po every 8 hours prn H/A  3. [redacted] weeks gestation of pregnancy   - Discharge home - Keep scheduled appt with [facility] next week - Patient verbalized an understanding of the plan of care and agrees.   Pamela Erickson, CNM 12/10/2022, 7:27 PM

## 2022-12-10 NOTE — Discharge Instructions (Signed)

## 2023-01-02 ENCOUNTER — Ambulatory Visit: Payer: Medicaid Other | Attending: Maternal & Fetal Medicine

## 2023-01-02 DIAGNOSIS — Z8759 Personal history of other complications of pregnancy, childbirth and the puerperium: Secondary | ICD-10-CM | POA: Diagnosis not present

## 2023-01-02 DIAGNOSIS — O34219 Maternal care for unspecified type scar from previous cesarean delivery: Secondary | ICD-10-CM | POA: Diagnosis not present

## 2023-01-02 DIAGNOSIS — O09293 Supervision of pregnancy with other poor reproductive or obstetric history, third trimester: Secondary | ICD-10-CM | POA: Diagnosis not present

## 2023-01-02 DIAGNOSIS — O99213 Obesity complicating pregnancy, third trimester: Secondary | ICD-10-CM | POA: Diagnosis not present

## 2023-01-02 DIAGNOSIS — O99891 Other specified diseases and conditions complicating pregnancy: Secondary | ICD-10-CM

## 2023-01-02 DIAGNOSIS — O99212 Obesity complicating pregnancy, second trimester: Secondary | ICD-10-CM | POA: Diagnosis not present

## 2023-01-02 DIAGNOSIS — M3500 Sicca syndrome, unspecified: Secondary | ICD-10-CM | POA: Diagnosis not present

## 2023-01-02 DIAGNOSIS — D573 Sickle-cell trait: Secondary | ICD-10-CM | POA: Diagnosis not present

## 2023-01-02 DIAGNOSIS — Z3A31 31 weeks gestation of pregnancy: Secondary | ICD-10-CM

## 2023-01-02 DIAGNOSIS — E669 Obesity, unspecified: Secondary | ICD-10-CM | POA: Diagnosis not present

## 2023-01-16 ENCOUNTER — Ambulatory Visit: Payer: Medicaid Other

## 2023-01-22 DIAGNOSIS — M3500 Sicca syndrome, unspecified: Secondary | ICD-10-CM | POA: Diagnosis not present

## 2023-01-22 DIAGNOSIS — Z79899 Other long term (current) drug therapy: Secondary | ICD-10-CM | POA: Diagnosis not present

## 2023-01-22 DIAGNOSIS — Z3A34 34 weeks gestation of pregnancy: Secondary | ICD-10-CM | POA: Diagnosis not present

## 2023-01-22 DIAGNOSIS — R768 Other specified abnormal immunological findings in serum: Secondary | ICD-10-CM | POA: Diagnosis not present

## 2023-01-22 DIAGNOSIS — O26893 Other specified pregnancy related conditions, third trimester: Secondary | ICD-10-CM | POA: Diagnosis not present

## 2023-01-30 ENCOUNTER — Ambulatory Visit: Payer: Medicaid Other | Attending: Obstetrics and Gynecology

## 2023-01-30 DIAGNOSIS — O99213 Obesity complicating pregnancy, third trimester: Secondary | ICD-10-CM

## 2023-01-30 DIAGNOSIS — O34219 Maternal care for unspecified type scar from previous cesarean delivery: Secondary | ICD-10-CM | POA: Diagnosis not present

## 2023-01-30 DIAGNOSIS — O09293 Supervision of pregnancy with other poor reproductive or obstetric history, third trimester: Secondary | ICD-10-CM | POA: Diagnosis not present

## 2023-01-30 DIAGNOSIS — Z3A35 35 weeks gestation of pregnancy: Secondary | ICD-10-CM | POA: Diagnosis not present

## 2023-01-30 DIAGNOSIS — O99891 Other specified diseases and conditions complicating pregnancy: Secondary | ICD-10-CM | POA: Diagnosis not present

## 2023-01-30 DIAGNOSIS — O99212 Obesity complicating pregnancy, second trimester: Secondary | ICD-10-CM | POA: Diagnosis not present

## 2023-01-30 DIAGNOSIS — Z8759 Personal history of other complications of pregnancy, childbirth and the puerperium: Secondary | ICD-10-CM | POA: Insufficient documentation

## 2023-01-30 DIAGNOSIS — E669 Obesity, unspecified: Secondary | ICD-10-CM | POA: Diagnosis not present

## 2023-01-30 DIAGNOSIS — D573 Sickle-cell trait: Secondary | ICD-10-CM | POA: Diagnosis not present

## 2023-01-30 DIAGNOSIS — M3500 Sicca syndrome, unspecified: Secondary | ICD-10-CM | POA: Insufficient documentation

## 2023-02-03 DIAGNOSIS — Z362 Encounter for other antenatal screening follow-up: Secondary | ICD-10-CM | POA: Diagnosis not present

## 2023-02-03 LAB — OB RESULTS CONSOLE GBS: GBS: POSITIVE

## 2023-02-18 ENCOUNTER — Other Ambulatory Visit: Payer: Self-pay | Admitting: Obstetrics and Gynecology

## 2023-02-23 ENCOUNTER — Inpatient Hospital Stay (HOSPITAL_COMMUNITY)
Admission: AD | Admit: 2023-02-23 | Payer: Medicaid Other | Source: Home / Self Care | Admitting: Obstetrics and Gynecology

## 2023-02-23 ENCOUNTER — Inpatient Hospital Stay (HOSPITAL_COMMUNITY): Payer: Medicaid Other | Admitting: Anesthesiology

## 2023-02-23 ENCOUNTER — Inpatient Hospital Stay (HOSPITAL_COMMUNITY)
Admission: AD | Admit: 2023-02-23 | Discharge: 2023-02-25 | DRG: 806 | Disposition: A | Discharge status: Home / Self Care | Payer: Medicaid Other | Attending: Obstetrics & Gynecology | Admitting: Obstetrics & Gynecology

## 2023-02-23 ENCOUNTER — Other Ambulatory Visit: Payer: Self-pay

## 2023-02-23 ENCOUNTER — Encounter (HOSPITAL_COMMUNITY): Payer: Self-pay | Admitting: Obstetrics and Gynecology

## 2023-02-23 DIAGNOSIS — A6000 Herpesviral infection of urogenital system, unspecified: Secondary | ICD-10-CM | POA: Diagnosis not present

## 2023-02-23 DIAGNOSIS — O9832 Other infections with a predominantly sexual mode of transmission complicating childbirth: Secondary | ICD-10-CM | POA: Diagnosis present

## 2023-02-23 DIAGNOSIS — Z3A39 39 weeks gestation of pregnancy: Secondary | ICD-10-CM | POA: Diagnosis not present

## 2023-02-23 DIAGNOSIS — O26893 Other specified pregnancy related conditions, third trimester: Secondary | ICD-10-CM | POA: Diagnosis not present

## 2023-02-23 DIAGNOSIS — D573 Sickle-cell trait: Secondary | ICD-10-CM | POA: Diagnosis not present

## 2023-02-23 DIAGNOSIS — Z79899 Other long term (current) drug therapy: Secondary | ICD-10-CM

## 2023-02-23 DIAGNOSIS — O3663X0 Maternal care for excessive fetal growth, third trimester, not applicable or unspecified: Secondary | ICD-10-CM | POA: Diagnosis present

## 2023-02-23 DIAGNOSIS — O99214 Obesity complicating childbirth: Secondary | ICD-10-CM | POA: Diagnosis present

## 2023-02-23 DIAGNOSIS — O34219 Maternal care for unspecified type scar from previous cesarean delivery: Principal | ICD-10-CM | POA: Diagnosis present

## 2023-02-23 DIAGNOSIS — O99824 Streptococcus B carrier state complicating childbirth: Secondary | ICD-10-CM | POA: Diagnosis not present

## 2023-02-23 DIAGNOSIS — M3500 Sicca syndrome, unspecified: Secondary | ICD-10-CM | POA: Diagnosis not present

## 2023-02-23 DIAGNOSIS — O9902 Anemia complicating childbirth: Secondary | ICD-10-CM | POA: Diagnosis not present

## 2023-02-23 DIAGNOSIS — Z7982 Long term (current) use of aspirin: Secondary | ICD-10-CM

## 2023-02-23 LAB — CBC
HCT: 30.2 % — ABNORMAL LOW (ref 36.0–46.0)
Hemoglobin: 9.2 g/dL — ABNORMAL LOW (ref 12.0–15.0)
MCH: 20 pg — ABNORMAL LOW (ref 26.0–34.0)
MCHC: 30.5 g/dL (ref 30.0–36.0)
MCV: 65.8 fL — ABNORMAL LOW (ref 80.0–100.0)
Platelets: 191 10*3/uL (ref 150–400)
RBC: 4.59 MIL/uL (ref 3.87–5.11)
RDW: 18.2 % — ABNORMAL HIGH (ref 11.5–15.5)
WBC: 4.5 10*3/uL (ref 4.0–10.5)
nRBC: 0 % (ref 0.0–0.2)

## 2023-02-23 LAB — RPR: RPR Ser Ql: NONREACTIVE

## 2023-02-23 LAB — TYPE AND SCREEN
ABO/RH(D): A POS
Antibody Screen: NEGATIVE

## 2023-02-23 LAB — POCT FERN TEST: POCT Fern Test: POSITIVE

## 2023-02-23 MED ORDER — PENICILLIN G POT IN DEXTROSE 60000 UNIT/ML IV SOLN
3.0000 10*6.[IU] | INTRAVENOUS | Status: DC
  Administered 2023-02-23 (×2): 3 10*6.[IU] via INTRAVENOUS
  Filled 2023-02-23 (×2): qty 50

## 2023-02-23 MED ORDER — SENNOSIDES-DOCUSATE SODIUM 8.6-50 MG PO TABS
2.0000 | ORAL_TABLET | Freq: Every day | ORAL | Status: DC
  Administered 2023-02-24: 2 via ORAL
  Filled 2023-02-23: qty 2

## 2023-02-23 MED ORDER — PHENYLEPHRINE 80 MCG/ML (10ML) SYRINGE FOR IV PUSH (FOR BLOOD PRESSURE SUPPORT)
80.0000 ug | PREFILLED_SYRINGE | INTRAVENOUS | Status: DC | PRN
  Filled 2023-02-23: qty 10

## 2023-02-23 MED ORDER — COCONUT OIL OIL: 1.0000 | TOPICAL_OIL | Status: DC | PRN

## 2023-02-23 MED ORDER — METHYLERGONOVINE MALEATE 0.2 MG/ML IJ SOLN
INTRAMUSCULAR | Status: AC
  Administered 2023-02-23: 0.2 mg via INTRAMUSCULAR
  Filled 2023-02-23: qty 1

## 2023-02-23 MED ORDER — PRENATAL MULTIVITAMIN CH
1.0000 | ORAL_TABLET | Freq: Every day | ORAL | Status: DC
  Administered 2023-02-24 – 2023-02-25 (×2): 1 via ORAL
  Filled 2023-02-23 (×2): qty 1

## 2023-02-23 MED ORDER — EPHEDRINE 5 MG/ML INJ: 10.0000 mg | INTRAVENOUS | Status: DC | PRN

## 2023-02-23 MED ORDER — PHENYLEPHRINE 80 MCG/ML (10ML) SYRINGE FOR IV PUSH (FOR BLOOD PRESSURE SUPPORT)
80.0000 ug | PREFILLED_SYRINGE | INTRAVENOUS | Status: DC | PRN
  Administered 2023-02-23: 80 ug via INTRAVENOUS

## 2023-02-23 MED ORDER — TRANEXAMIC ACID-NACL 1000-0.7 MG/100ML-% IV SOLN
INTRAVENOUS | Status: AC
  Administered 2023-02-23: 1000 mg
  Filled 2023-02-23: qty 100

## 2023-02-23 MED ORDER — FENTANYL CITRATE (PF) 100 MCG/2ML IJ SOLN: 50.0000 ug | INTRAMUSCULAR | Status: DC | PRN

## 2023-02-23 MED ORDER — METHYLERGONOVINE MALEATE 0.2 MG/ML IJ SOLN: 0.2000 mg | Freq: Once | INTRAMUSCULAR | Status: AC

## 2023-02-23 MED ORDER — ZOLPIDEM TARTRATE 5 MG PO TABS: 5.0000 mg | ORAL_TABLET | Freq: Every evening | ORAL | Status: DC | PRN

## 2023-02-23 MED ORDER — DIPHENHYDRAMINE HCL 25 MG PO CAPS: 25.0000 mg | ORAL_CAPSULE | Freq: Four times a day (QID) | ORAL | Status: DC | PRN

## 2023-02-23 MED ORDER — EPHEDRINE 5 MG/ML INJ
10.0000 mg | INTRAVENOUS | Status: DC | PRN
  Filled 2023-02-23: qty 5

## 2023-02-23 MED ORDER — FENTANYL-BUPIVACAINE-NACL 0.5-0.125-0.9 MG/250ML-% EP SOLN
12.0000 mL/h | EPIDURAL | Status: DC | PRN
  Administered 2023-02-23: 12 mL/h via EPIDURAL
  Filled 2023-02-23: qty 250

## 2023-02-23 MED ORDER — LACTATED RINGERS IV SOLN: INTRAVENOUS | Status: DC

## 2023-02-23 MED ORDER — OXYCODONE-ACETAMINOPHEN 5-325 MG PO TABS: 1.0000 | ORAL_TABLET | ORAL | Status: DC | PRN

## 2023-02-23 MED ORDER — LIDOCAINE HCL (PF) 1 % IJ SOLN
INTRAMUSCULAR | Status: DC | PRN
  Administered 2023-02-23: 5 mL via EPIDURAL

## 2023-02-23 MED ORDER — LIDOCAINE-EPINEPHRINE (PF) 1.5 %-1:200000 IJ SOLN
INTRAMUSCULAR | Status: DC | PRN
  Administered 2023-02-23: 5 mL via EPIDURAL

## 2023-02-23 MED ORDER — DIBUCAINE (PERIANAL) 1 % EX OINT: 1.0000 | TOPICAL_OINTMENT | CUTANEOUS | Status: DC | PRN

## 2023-02-23 MED ORDER — TETANUS-DIPHTH-ACELL PERTUSSIS 5-2.5-18.5 LF-MCG/0.5 IM SUSY: 0.5000 mL | PREFILLED_SYRINGE | Freq: Once | INTRAMUSCULAR | Status: DC

## 2023-02-23 MED ORDER — ONDANSETRON HCL 4 MG/2ML IJ SOLN: 4.0000 mg | Freq: Four times a day (QID) | INTRAMUSCULAR | Status: DC | PRN

## 2023-02-23 MED ORDER — OXYTOCIN-SODIUM CHLORIDE 30-0.9 UT/500ML-% IV SOLN
2.5000 [IU]/h | INTRAVENOUS | Status: DC
  Administered 2023-02-23 (×2): 2.5 [IU]/h via INTRAVENOUS
  Filled 2023-02-23: qty 500

## 2023-02-23 MED ORDER — LIDOCAINE HCL (PF) 1 % IJ SOLN: 30.0000 mL | INTRAMUSCULAR | Status: DC | PRN

## 2023-02-23 MED ORDER — FLEET ENEMA RE ENEM: 1.0000 | ENEMA | Freq: Every day | RECTAL | Status: DC | PRN

## 2023-02-23 MED ORDER — ONDANSETRON HCL 4 MG PO TABS: 4.0000 mg | ORAL_TABLET | ORAL | Status: DC | PRN

## 2023-02-23 MED ORDER — IBUPROFEN 600 MG PO TABS
600.0000 mg | ORAL_TABLET | Freq: Four times a day (QID) | ORAL | Status: DC
  Administered 2023-02-23 – 2023-02-25 (×7): 600 mg via ORAL
  Filled 2023-02-23 (×7): qty 1

## 2023-02-23 MED ORDER — LACTATED RINGERS IV SOLN: 500.0000 mL | INTRAVENOUS | Status: DC | PRN

## 2023-02-23 MED ORDER — TERBUTALINE SULFATE 1 MG/ML IJ SOLN: 0.2500 mg | Freq: Once | INTRAMUSCULAR | Status: DC | PRN

## 2023-02-23 MED ORDER — DIPHENHYDRAMINE HCL 50 MG/ML IJ SOLN: 12.5000 mg | INTRAMUSCULAR | Status: DC | PRN

## 2023-02-23 MED ORDER — LACTATED RINGERS IV SOLN: 500.0000 mL | Freq: Once | INTRAVENOUS | Status: DC

## 2023-02-23 MED ORDER — OXYCODONE-ACETAMINOPHEN 5-325 MG PO TABS: 2.0000 | ORAL_TABLET | ORAL | Status: DC | PRN

## 2023-02-23 MED ORDER — OXYTOCIN BOLUS FROM INFUSION
333.0000 mL | Freq: Once | INTRAVENOUS | Status: AC
  Administered 2023-02-23: 333 mL via INTRAVENOUS

## 2023-02-23 MED ORDER — BENZOCAINE-MENTHOL 20-0.5 % EX AERO
1.0000 | INHALATION_SPRAY | CUTANEOUS | Status: DC | PRN
  Filled 2023-02-23: qty 56

## 2023-02-23 MED ORDER — SIMETHICONE 80 MG PO CHEW: 80.0000 mg | CHEWABLE_TABLET | ORAL | Status: DC | PRN

## 2023-02-23 MED ORDER — ACETAMINOPHEN 325 MG PO TABS
650.0000 mg | ORAL_TABLET | ORAL | Status: DC | PRN
  Administered 2023-02-23 – 2023-02-24 (×2): 650 mg via ORAL
  Filled 2023-02-23 (×2): qty 2

## 2023-02-23 MED ORDER — SODIUM CHLORIDE 0.9 % IV SOLN
5.0000 10*6.[IU] | Freq: Once | INTRAVENOUS | Status: AC
  Administered 2023-02-23: 5 10*6.[IU] via INTRAVENOUS
  Filled 2023-02-23: qty 5

## 2023-02-23 MED ORDER — ONDANSETRON HCL 4 MG/2ML IJ SOLN: 4.0000 mg | INTRAMUSCULAR | Status: DC | PRN

## 2023-02-23 MED ORDER — OXYTOCIN-SODIUM CHLORIDE 30-0.9 UT/500ML-% IV SOLN
1.0000 m[IU]/min | INTRAVENOUS | Status: DC
  Administered 2023-02-23: 2 m[IU]/min via INTRAVENOUS
  Filled 2023-02-23: qty 500

## 2023-02-23 MED ORDER — WITCH HAZEL-GLYCERIN EX PADS: 1.0000 | MEDICATED_PAD | CUTANEOUS | Status: DC | PRN

## 2023-02-23 MED ORDER — SOD CITRATE-CITRIC ACID 500-334 MG/5ML PO SOLN: 30.0000 mL | ORAL | Status: DC | PRN

## 2023-02-23 MED ORDER — ACETAMINOPHEN 325 MG PO TABS: 650.0000 mg | ORAL_TABLET | ORAL | Status: DC | PRN

## 2023-02-23 NOTE — H&P (Signed)
Pamela Erickson is a 32 y.o. female, G4P2102, IUP at 39.1 weeks, TLOAC presenting for SROM, light meconium @ 0317am 8/18 witrh irregular cxt, in latent labor. Pt endorse + Fm. Denies vaginal bleeding.  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 Seen by rheumatologist 11/2017, on Plaquinil.  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 (with PAP 7/25) HSV (no lesion sor prodromal s/sx on valtrex since 37 weeks, outbreak in beginning of pregnancy)  large for gestation age fetus (91% 7.2lbs on 7/16) past pregnancy history of cesarean section (2014 for failure to progress and fetal intolerance, VBAC 2020, desires TOL for current pregnancy)  past pregnancy history of intrauterine fetal death (20 week, 2017-08-14, from heart block, hydrops.Marland Kitchen MFM recommendations: Weekly limited scan for heart block from 16 weeks, detailed anatomy scan 18-20 weeks, serial [place] for growth q 4 weeks, close antenatal surveillance 3rd trimester. NORMAL FETAL ECHOS THROUGH 30 WEEKS.) sickle cell trait (FOB Neg per MFM)   Patient Active Problem List   Diagnosis Date Noted   Normal labor 02/23/2023   History of IUFD 10/29/2022   Obesity (BMI 35.0-39.9 without comorbidity) 04/20/2020   VBAC, delivered 09/19/2018   Sickle cell trait (HCC) Aug 14, 2017   History of cesarean section 07/20/2017     Active Ambulatory Problems    Diagnosis Date Noted   Sickle cell trait (HCC) 2017/08/14   VBAC, delivered 09/19/2018   Obesity (BMI 35.0-39.9 without comorbidity) 04/20/2020   History of cesarean section 07/20/2017   History of IUFD 10/29/2022   Resolved Ambulatory Problems    Diagnosis Date Noted   Genital HSV 10/07/2012   Vaginal bleeding before [redacted] weeks gestation 10/07/2012   IUFD at 20 weeks or more of gestation 08-14-17   Fetal demise before 22 weeks with retention of dead fetus 2017-08-14    History of fetal demise, not currently pregnant 12/04/2017   Sjogren's syndrome with keratoconjunctivitis sicca (HCC) 02/27/2018   History of intrauterine fetal death, currently pregnant, third trimester 09/16/2018   Postpartum hemorrhage 09/17/2018   Anemia 06/08/2013   [redacted] weeks gestation of pregnancy 05/21/2018   Past Medical History:  Diagnosis Date   Abnormal Pap smear    BV (bacterial vaginosis)    Herpes    Infection 2011   Low vitamin D level    Sjogren's syndrome (HCC)    Torn ACL (anterior cruciate ligament) 2008   Trichomonas infection    UTI (urinary tract infection)    Vaginal Pap smear, abnormal       Medications Prior to Admission  Medication Sig Dispense Refill Last Dose   aspirin EC 81 MG tablet Take 81 mg by mouth daily. Swallow whole.   Past Week   hydroxychloroquine (PLAQUENIL) 200 MG tablet Take 400 mg by mouth daily. Take 2 tablets daily   02/23/2023   Prenatal MV & Min w/FA-DHA (PRENATAL ADULT GUMMY/DHA/FA PO) Take by mouth daily.   02/23/2023   valACYclovir (VALTREX) 500 MG tablet Take 500 mg by mouth at bedtime.   02/23/2023   VITAMIN D PO Take by mouth.   Past Week   promethazine-dextromethorphan (PROMETHAZINE-DM) 6.25-15 MG/5ML syrup Take 5 mLs by mouth at bedtime as needed for cough. 180 mL 0    SODIUM FLUORIDE, DENTAL RINSE, 0.2 % SOLN Take by mouth.       Past Medical History:  Diagnosis Date   Abnormal Pap smear    last  pap 01/2012   Anemia    During pregnancy   Anemia 06/08/2013   BV (bacterial vaginosis)    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   Low vitamin D level    Postpartum hemorrhage 09/17/2018   Sjogren's syndrome (HCC)    Sjogren's syndrome with keratoconjunctivitis sicca (HCC) 02/27/2018   Torn ACL (anterior cruciate ligament) 2008   Trichomonas infection    UTI (urinary tract infection)    Vaginal Pap smear, abnormal    May 2018; biopsy was normal      No current facility-administered medications on file prior to encounter.   Current Outpatient Medications on File Prior to Encounter  Medication Sig Dispense Refill   aspirin EC 81 MG tablet Take 81 mg by mouth daily. Swallow whole.     hydroxychloroquine (PLAQUENIL) 200 MG tablet Take 400 mg by mouth daily. Take 2 tablets daily     Prenatal MV & Min w/FA-DHA (PRENATAL ADULT GUMMY/DHA/FA PO) Take by mouth daily.     valACYclovir (VALTREX) 500 MG tablet Take 500 mg by mouth at bedtime.     VITAMIN D PO Take by mouth.     promethazine-dextromethorphan (PROMETHAZINE-DM) 6.25-15 MG/5ML syrup Take 5 mLs by mouth at bedtime as needed for cough. 180 mL 0   SODIUM FLUORIDE, DENTAL RINSE, 0.2 % SOLN Take by mouth.       Allergies  Allergen Reactions   Cephalexin     Other reaction(s): other    History of present pregnancy: Pt Info/Preference:  Screening/Consents:  Labs:   EDD: Estimated Date of Delivery: 03/01/23  Establised: Patient's last menstrual period was 05/25/2022 (approximate).  Anatomy Scan: Date: 10/03/2022 Placenta Location: anterior Genetic Screen: Panoroma:LR AFP: declined First Tri: Quad: Horizon: neg  Office: [facility]            First PNV: 10.2 weeks Blood Type --/--/A POS (08/18 0503)  Language: english Last PNV: 38.3 weeks Rhogam    Flu Vaccine:  declined   Antibody NEG (08/18 0503)  TDaP vaccine declined   GTT: Early: 5.5 Third Trimester: 81  Feeding Plan: breast BTL: no Rubella: Immune (02/01 0000)  Contraception: ??? VBAC: Yes in 2020 and current, consent signed.  RPR:   NR 08/05/2022  Circumcision: Desires in pt   HBsAg: Negative (02/01 0000)  Pediatrician:  ???   HIV: Non-reactive (05/21 0000)   Prenatal Classes: no Additional [place]: 7/25 anterior placenta, 7.2lbs 91%, AFI 20.38 GBS: Positive/-- (07/29 0000)(For PCN allergy, check sensitivities)       Chlamydia: neg    MFM Referral/Consult:  GC: neg  Support Person: Mother and parnter   PAP: 2024 +HPV  Pain  Management: Natural but may desires epidural Neonatologist Referral:  Hgb Electrophoresis:  AS  Birth Plan: DCC, VBAC   Hgb NOB: 12.6    28W: 10.7   OB History     Gravida  4   Para  3   Term  2   Preterm  1   AB  0   Living  2      SAB  0   IAB  0   Ectopic  0   Multiple  0   Live Births  2          Past Medical History:  Diagnosis Date   Abnormal Pap smear    last pap 01/2012   Anemia  During pregnancy   Anemia 06/08/2013   BV (bacterial vaginosis)    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   Low vitamin D level    Postpartum hemorrhage 09/17/2018   Sjogren's syndrome (HCC)    Sjogren's syndrome with keratoconjunctivitis sicca (HCC) 02/27/2018   Torn ACL (anterior cruciate ligament) 2008   Trichomonas infection    UTI (urinary tract infection)    Vaginal Pap smear, abnormal    May 2018; biopsy was normal   Past Surgical History:  Procedure Laterality Date   ANTERIOR CRUCIATE LIGAMENT REPAIR  2008   CESAREAN SECTION N/A 04/25/2013   Procedure: CESAREAN SECTION;  Surgeon: Michael Litter, MD;  Location: [facility];  Service: Obstetrics;  Laterality: N/A;   COLPOSCOPY     endocervical curettage  2018   Family History: family history includes Diabetes in her maternal aunt; Hypertension in her father, maternal grandfather, maternal grandmother, and mother. Social History:  reports that she has never smoked. She has never used smokeless tobacco. She reports that she does not currently use alcohol. She reports that she does not currently use drugs after having used the following drugs: Marijuana.   Prenatal Transfer Tool  Maternal Diabetes: No Genetic Screening: Normal Maternal Ultrasounds/Referrals: Normal Fetal Ultrasounds or other Referrals:  Fetal echo, Referred to Materal Fetal Medicine  Maternal Substance Abuse:  No Significant Maternal Medications:  Meds include: Other:   Plaquenil  Significant Maternal Lab Results: Group B Strep positive HSV + on valtrex since 37 weeks   ROS:  Review of Systems  Constitutional: Negative.   HENT: Negative.    Eyes: Negative.   Respiratory: Negative.    Cardiovascular: Negative.   Gastrointestinal: Negative.   Genitourinary: Negative.        Leakage of fluid  Musculoskeletal: Negative.   Skin: Negative.   Neurological: Negative.   Endo/Heme/Allergies: Negative.   Psychiatric/Behavioral: Negative.       Physical Exam: BP 107/64   Pulse 94   Temp 98.2 F (36.8 C) (Oral)   Resp 17   Ht 5\' 2"  (1.575 m)   Wt 108.1 kg   LMP 05/25/2022 (Approximate)   SpO2 99%   BMI 43.60 kg/m   Physical Exam Vitals and nursing note reviewed. Exam conducted with a chaperone present.  Constitutional:      Appearance: Normal appearance.  HENT:     Head: Normocephalic and atraumatic.     Nose: Nose normal.     Mouth/Throat:     Pharynx: Oropharynx is clear.  Eyes:     Conjunctiva/sclera: Conjunctivae normal.  Cardiovascular:     Rate and Rhythm: Normal rate and regular rhythm.     Pulses: Normal pulses.     Heart sounds: Normal heart sounds.  Pulmonary:     Effort: Pulmonary effort is normal.     Breath sounds: Normal breath sounds.  Abdominal:     General: Abdomen is flat.     Palpations: Abdomen is soft.  Genitourinary:    General: Normal vulva.     Rectum: Normal.     Comments: No lesion noted on speculum exam.  Musculoskeletal:        General: Normal range of motion.  Skin:    General: Skin is warm.     Capillary Refill: Capillary refill takes less than 2 seconds.  Neurological:     General: No focal deficit present.  Mental Status: She is alert.  Psychiatric:        Mood and Affect: Mood normal.      NST: FHR baseline 135-140 bpm, Variability: moderate, Accelerations:present, Decelerations:  Absent= Cat 1/Reactive UC:   irregular, every 4-6 minutes SVE:   Dilation: 5 Effacement (%): 70 Station:  -3 Exam by:: [place], CNM, vertex verified by fetal sutures.  Leopold's: Position vertex, EFW 8.5lbs via leopold's.   Labs: Results for orders placed or performed during the hospital encounter of 02/23/23 (from the past 24 hour(s))  POCT fern test     Status: Abnormal   Collection Time: 02/23/23  4:46 AM  Result Value Ref Range   POCT Fern Test Positive = ruptured amniotic membanes   Type and screen [facility]     Status: None   Collection Time: 02/23/23  5:03 AM  Result Value Ref Range   ABO/RH(D) A POS    Antibody Screen NEG    Sample Expiration      02/26/2023,2359 Performed at [facility], [address] [place], [place] [address]   CBC     Status: Abnormal   Collection Time: 02/23/23  5:06 AM  Result Value Ref Range   WBC 4.5 4.0 - 10.5 K/uL   RBC 4.59 3.87 - 5.11 MIL/uL   Hemoglobin 9.2 (L) 12.0 - 15.0 g/dL   HCT 62.1 (L) 30.8 - 65.7 %   MCV 65.8 (L) 80.0 - 100.0 fL   MCH 20.0 (L) 26.0 - 34.0 pg   MCHC 30.5 30.0 - 36.0 g/dL   RDW 84.6 (H) 96.2 - 95.2 %   Platelets 191 150 - 400 K/uL   nRBC 0.0 0.0 - 0.2 %    Imaging:  [place] MFM OB FOLLOW UP  Result Date: 01/30/2023 ----------------------------------------------------------------------  OBSTETRICS REPORT                       (Signed Final 01/30/2023 10:43 am) ---------------------------------------------------------------------- Patient Info  ID #:       [id number]                          D.O.B.:  Sep 26, 1990 (32 yrs)  Name:       Pamela Erickson                 Visit Date: 01/30/2023 09:49 am ---------------------------------------------------------------------- Performed By  Attending:        Noralee Space MD        Ref. Address:     [facility]  Performed By:     Kris Hartmann,      [facility]                                     [facility]                                                             [facility]  Referred By:      Mittie Bodo ---------------------------------------------------------------------- Orders  #  Description                           Code        Ordered By  1  [place] MFM OB FOLLOW UP                   81191.47    Noralee Space ----------------------------------------------------------------------  #  Order #                     Accession #                Episode #  1  [id number]                   [id number]                 [id number] ---------------------------------------------------------------------- Indications  [redacted] weeks gestation of pregnancy                Z3A.35  Obesity complicating pregnancy, third          O99.213  trimester (BMI 39)  Medical complication of pregnancy (Sjogren     O26.90  syndrome, +SSA. +SSB)  History of sickle cell trait                   Z86.2  History of cesarean delivery, currently        O34.219  pregnant  Poor obstetric history: Previous IUFD          O09.299  ([redacted]w[redacted]d - 3rd degree heart block)  LR NIPS/Horizon neg ---------------------------------------------------------------------- Fetal Evaluation  Num Of Fetuses:         1  Fetal Heart Rate(bpm):  146  Cardiac Activity:       Observed  Presentation:           Cephalic  Placenta:               Anterior  P. Cord Insertion:      Visualized  Amniotic Fluid  AFI FV:      Within normal limits  AFI Sum(cm)     %Tile       Largest Pocket(cm)  20.38           77          6.62  RUQ(cm)       RLQ(cm)       LUQ(cm)        LLQ(cm)  5.82          6.62          4.7            3.24 ---------------------------------------------------------------------- Biometry  BPD:        95  mm     G. Age:  38w 5d       > 99  %    CI:        78.03   %    70 - 86                                                          FL/HC:      20.9   %  20.1 - 22.1  HC:      340.3  mm     G. Age:  39w 1d         92  %    HC/AC:      1.02        0.93 - 1.11  AC:      333.9  mm     G. Age:  37w 2d         92  %    FL/BPD:     74.9   %     71 - 87  FL:       71.2  mm     G. Age:  36w 3d         65  %    FL/AC:      21.3   %    20 - 24  HUM:      59.1  mm     G. Age:  34w 1d         37  %  LV:        6.9  mm  Est. FW:    3220  gm      7 lb 2 oz     91  % ---------------------------------------------------------------------- OB History  Gravidity:    4         Term:   2        Prem:   1        SAB:   0  TOP:          0       Ectopic:  0        Living: 2 ---------------------------------------------------------------------- Gestational Age  LMP:           35w 5d        Date:  05/25/22                 EDD:   03/01/23  U/S Today:     37w 6d                                        EDD:   02/14/23  Best:          35w 5d     Det. By:  LMP  (05/25/22)          EDD:   03/01/23 ---------------------------------------------------------------------- Anatomy  Cranium:               Appears normal         Aortic Arch:            Previously seen  Cavum:                 Appears normal         Ductal Arch:            Previously seen  Ventricles:            Appears normal         Diaphragm:              Appears normal  Choroid Plexus:        Previously seen        Stomach:                Appears normal, left  sided  Cerebellum:            Previously seen        Abdomen:                Appears normal  Posterior Fossa:       Previously seen        Abdominal Wall:         Previously seen  Nuchal Fold:           Previously seen        Cord Vessels:           Appears normal (3                                                                        vessel cord)  Face:                  Orbits and profile     Kidneys:                Appear normal                         previously seen  Lips:                  Previously seen        Bladder:                Appears normal  Thoracic:              Appears normal         Spine:                  Previously seen  Heart:                 Previously seen        Upper  Extremities:      Previously seen  RVOT:                  Previously seen        Lower Extremities:      Previously seen  LVOT:                  Previously seen  Other:  SVC/IVC, 3VV, 3VTV previously visualized. Nasal bone, lenses,          maxilla, mandible and falx previously visualized. Hands and feet          previously visualized. Heels previously seen. Female gender. ---------------------------------------------------------------------- Cervix Uterus Adnexa  Right Ovary  Within normal limits.  Left Ovary  Within normal limits.  Cul De Sac  No free fluid seen.  Adnexa  No abnormality visualized ---------------------------------------------------------------------- Impression  Sjogren syndrome with increased anti-SSA antibodies.  She  does not have gestational diabetes.  Fetal growth is appropriate for gestational age.  Amniotic fluid  is normal good fetal activity seen.  Fetal heart rate and  rhythm appear normal.  I reassured the patient of the findings. ---------------------------------------------------------------------- Recommendations  Follow-up scans as clinically indicated. ----------------------------------------------------------------------                 Noralee Space, MD Electronically Signed Final Report  01/30/2023 10:43 am ----------------------------------------------------------------------    MAU Course: Orders Placed This Encounter  Procedures   OB RESULT CONSOLE Group B Strep   CBC   RPR   OB RESULTS CONSOLE Hepatitis B surface antigen   OB RESULTS CONSOLE HIV antibody   OB RESULTS CONSOLE GC/Chlamydia   OB RESULTS CONSOLE GC/Chlamydia   OB RESULTS CONSOLE Rubella Antibody   Hepatitis C antibody   Diet clear liquid Room service appropriate? Yes; Fluid consistency: Thin   Contraction - monitoring   External fetal heart monitoring   Vaginal exam   Vitals signs per unit policy   Notify physician (specify)   Fetal monitoring per unit policy   Activity as tolerated   Cervical  Exam   Measure blood pressure post delivery every 15 min x 1 hour then every 30 min x 1 hour   Fundal check post delivery every 15 min x 1 hour then every 30 min x 1 hour   Apply Labor & Delivery Care Plan   If Rapid HIV test positive or known HIV positive: initiate AZT orders   May in and out cath x 2 for inability to void   Insert urethral catheter X 1 PRN If Coude Catheter is chosen, qualified resources by campus can be found in the clinical skills nursing procedure for Coude Catheter 1. If straight catheterized > 2 times or patient unable to void post epidural plac...   Refer to Sidebar Report Urinary (Foley) Catheter Indications   Refer to Sidebar Report Post Indwelling Urinary Catheter Removal and Intervention Guidelines   Discontinue foley prior to vaginal delivery   Initiate Oral Care Protocol   Initiate Carrier Fluid Protocol   Informed Consent Details: Physician/Practitioner Attestation; Transcribe to consent form and obtain patient signature   Patient may have epidural placement upon request   Full code   Nitrous Oxide 50%/Oxygen 50%   POCT fern test   Type and screen [facility]   Insert and maintain IV Line   Admit to Inpatient (patient's expected length of stay will be greater than 2 midnights or inpatient only procedure)   Meds ordered this encounter  Medications   lactated ringers infusion   oxytocin (PITOCIN) IV BOLUS FROM BAG   oxytocin (PITOCIN) IV infusion 30 units in NS 500 mL - Premix   lactated ringers infusion 500-1,000 mL   acetaminophen (TYLENOL) tablet 650 mg   oxyCODONE-acetaminophen (PERCOCET/ROXICET) 5-325 MG per tablet 1 tablet   oxyCODONE-acetaminophen (PERCOCET/ROXICET) 5-325 MG per tablet 2 tablet   sodium phosphate (FLEET) enema 1 enema   ondansetron (ZOFRAN) injection 4 mg   sodium citrate-citric acid (ORACIT) solution 30 mL   lidocaine (PF) (XYLOCAINE) 1 % injection 30 mL   FOLLOWED BY Linked Order Group    penicillin G  potassium 5 Million Units in sodium chloride 0.9 % 250 mL IVPB     Order Specific Question:   Antibiotic Indication:     Answer:   Group B Strep Prophylaxis    penicillin G potassium 3 Million Units in dextrose 50mL IVPB     Order Specific Question:   Antibiotic Indication:     Answer:   Group B Strep Prophylaxis   fentaNYL (SUBLIMAZE) injection 50-100 mcg    Assessment/Plan: ISHYA SALIH is a 32 y.o. female, G4P2102, IUP at 39.1 weeks, TLOAC presenting for SROM, light meconium @ 0317am 8/18 witrh irregular cxt, in latent labor. Pt endorse + Fm. Denies vaginal bleeding.  Prenatal Problem: Obesity (BMI 40 @  NOB) Sjgren's syndrome (Positive SSA--referred back to rhematology pp. Seen by rheumatologist 11/2017, on Plaquinil.  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 (with PAP 7/25) HSV (no lesion sor prodromal s/sx on valtrex since 37 weeks, outbreak in beginning of pregnancy)  large for gestation age fetus (91% 7.2lbs on 7/16) past pregnancy history of cesarean section (2014 for failure to progress and fetal intolerance, VBAC 2020, desires TOL for current pregnancy)  past pregnancy history of intrauterine fetal death (20 week, 2017/08/26, from heart block, hydrops.Marland Kitchen MFM recommendations: Weekly limited scan for heart block from 16 weeks, detailed anatomy scan 18-20 weeks, serial [place] for growth q 4 weeks, close antenatal surveillance 3rd trimester. NORMAL FETAL ECHOS THROUGH 30 WEEKS.) sickle cell trait (FOB Neg per MFM)   FWB: Cat 1 Fetal Tracing.   Plan: Admit to Birthing Suite per consult with DR Sallye Ober Routine [facility] orders Pain med/epidural prn PCN G for GBS prophylaxis Q4H Expectant management Per pt request discussed pitocin pt declined  Anticipate labor progression   [facility], FNP-C, PMHNP-BC  [id number]  [place], [place] [address]  Cell: [phone]  Office Phone: [phone] Fax:  [phone] 02/23/2023  8:26 AM

## 2023-02-23 NOTE — Anesthesia Procedure Notes (Signed)
Epidural Patient location during procedure: OB Start time: 02/23/2023 5:34 PM End time: 02/23/2023 5:44 PM  Staffing Anesthesiologist: Leonides Grills, MD Performed: anesthesiologist   Preanesthetic Checklist Completed: patient identified, IV checked, site marked, risks and benefits discussed, monitors and equipment checked, pre-op evaluation and timeout performed  Epidural Patient position: sitting Prep: DuraPrep Patient monitoring: heart rate, cardiac monitor, continuous pulse ox and blood pressure Approach: midline Location: L3-L4 Injection technique: LOR air  Needle:  Needle type: Tuohy  Needle gauge: 17 G Needle length: 9 cm Needle insertion depth: 7 cm Catheter type: closed end flexible Catheter size: 19 Gauge Catheter at skin depth: 13 cm Test dose: negative and 1.5% lidocaine with Epi 1:200 K  Assessment Events: blood not aspirated, no cerebrospinal fluid, injection not painful, no injection resistance and negative IV test  Additional Notes Informed consent obtained prior to proceeding including risk of failure, 1% risk of PDPH, risk of minor discomfort and bruising. Discussed alternatives to epidural analgesia and patient desires to proceed.  Timeout performed pre-procedure verifying patient name, procedure, and platelet count.  Patient tolerated procedure well. Reason for block:procedure for pain

## 2023-02-23 NOTE — MAU Note (Signed)
..  Pamela Erickson is a 31 y.o. at [redacted]w[redacted]d here in MAU reporting: leaking of fluid since 0317, she gush of clear fluid.  No contractions. +FM  Pain score: 0/10  [id number] in room  Lab orders placed from triage:  MAU labor evaluation

## 2023-02-23 NOTE — Anesthesia Preprocedure Evaluation (Signed)
Anesthesia Evaluation  Patient identified by MRN, date of birth, ID band Patient awake    Reviewed: Allergy & Precautions, H&P , NPO status , Patient's Chart, lab work & pertinent test results  History of Anesthesia Complications Negative for: history of anesthetic complications  Airway Mallampati: II  TM Distance: >3 FB Neck ROM: full    Dental no notable dental hx. (+) Teeth Intact   Pulmonary neg pulmonary ROS   Pulmonary exam normal        Cardiovascular negative cardio ROS Normal cardiovascular exam     Neuro/Psych negative neurological ROS  negative psych ROS   GI/Hepatic negative GI ROS, Neg liver ROS,,,  Endo/Other    Morbid obesity  Renal/GU negative Renal ROS  negative genitourinary   Musculoskeletal   Abdominal   Peds  Hematology  (+) Blood dyscrasia, Sickle cell trait and anemia   Anesthesia Other Findings   Reproductive/Obstetrics (+) Pregnancy                             Anesthesia Physical Anesthesia Plan  ASA: 3  Anesthesia Plan: Epidural   Post-op Pain Management:    Induction:   PONV Risk Score and Plan:   Airway Management Planned:   Additional Equipment:   Intra-op Plan:   Post-operative Plan:   Informed Consent: I have reviewed the patients History and Physical, chart, labs and discussed the procedure including the risks, benefits and alternatives for the proposed anesthesia with the patient or authorized representative who has indicated his/her understanding and acceptance.       Plan Discussed with:   Anesthesia Plan Comments:        Anesthesia Quick Evaluation

## 2023-02-23 NOTE — Progress Notes (Signed)
Labor Progress Note  Pamela Erickson is a 32 y.o. 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 Seen by rheumatologist 11/2017, on Plaquinil.  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 (with PAP 7/25) HSV (no lesion sor prodromal s/sx on valtrex since 37 weeks, outbreak in beginning of pregnancy)  large for gestation age fetus (91% 7.2lbs on 7/16) past pregnancy history of cesarean section (2014 for failure to progress and fetal intolerance, VBAC 2020, desires TOL for current pregnancy)  past pregnancy history of intrauterine fetal death (20 week, 2017-08-11, from heart block, hydrops.Marland Kitchen MFM recommendations: Weekly limited scan for heart block from 16 weeks, detailed anatomy scan 18-20 weeks, serial [place] for growth q 4 weeks, close antenatal surveillance 3rd trimester. NORMAL FETAL ECHOS THROUGH 30 WEEKS.) sickle cell trait (FOB Neg per MFM)   Subjective: Pt stable sitting up in bed, feeling cxt more intense now, discussed pitocin and pt still declined for now. Mother at bedside and supportive.  Patient Active Problem List   Diagnosis Date Noted   Normal labor 02/23/2023   History of IUFD 10/29/2022   Obesity (BMI 35.0-39.9 without comorbidity) 04/20/2020   VBAC, delivered 09/19/2018   Sickle cell trait (HCC) 08-11-17   History of cesarean section 07/20/2017   Objective: BP 107/64   Pulse 94   Temp 98.2 F (36.8 C) (Oral)   Resp 17   Ht 5\' 2"  (1.575 m)   Wt 108.1 kg   LMP 05/25/2022 (Approximate)   SpO2 99%   BMI 43.60 kg/m  No intake/output data recorded. No intake/output data recorded. NST: FHR baseline 130 bpm, Variability: moderate, Accelerations:present, Decelerations:  Absent= Cat 1/Reactive CTX:  irregular, every 2-6  minutes Uterus gravid, soft non tender, moderate to palpate with contractions.  SVE:  Dilation: 6 Effacement (%): 80 Station: -2 Exam by:: [employer] CNM Pitocin at N/AmUn/min  Assessment:  Pamela Erickson is a 32 y.o. female, G4P2102, IUP at 39.1 weeks, TLOAC presenting for SROM, light meconium @ 0317am 8/18 witrh irregular cxt, in active labor.   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 Seen by rheumatologist 11/2017, on Plaquinil.  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 (with PAP 7/25) HSV (no lesion sor prodromal s/sx on valtrex since 37 weeks, outbreak in beginning of pregnancy)  large for gestation age fetus (91% 7.2lbs on 7/16) past pregnancy history of cesarean section (2014 for failure to progress and fetal intolerance, VBAC 2020, desires TOL for current pregnancy)  past pregnancy history of intrauterine fetal death (20 week, 2017/08/11, from heart block, hydrops.Marland Kitchen MFM recommendations: Weekly limited scan for heart block from 16 weeks, detailed anatomy scan 18-20 weeks, serial [place] for growth q 4 weeks, close antenatal surveillance 3rd trimester. NORMAL FETAL ECHOS THROUGH 30 WEEKS.) sickle cell trait (FOB Neg per MFM)  Patient Active Problem List   Diagnosis Date Noted   Normal labor 02/23/2023   History of IUFD 10/29/2022   Obesity (BMI 35.0-39.9 without comorbidity) 04/20/2020   VBAC, delivered 09/19/2018   Sickle cell trait (HCC) 11-Aug-2017   History of cesarean section 07/20/2017   NICHD: Category 1  Membranes:  SROM, light meconium, no s/s of infection  Induction:    Cytotec xN/A TOLAC  Foley Bulb: N/A 6cm  Pitocin - Declined  Pain management:               IV pain management: x PRN  Nitrous: PRN             Epidural placement: PRN  GBS Positive  Abx: PCN @ 0530, 1114  Plan: Continue labor plan Continuous  monitoring Rest Ambulate Frequent position changes to facilitate fetal rotation and descent. Will reassess with cervical exam at 4 hours or earlier if necessary Expectant management.  GBS+ continue PCN Q4H until delivery.  Anticipate labor progression and vaginal delivery.   [facility] CNM, FNP-C, PMHNP-BC  [address]  [place], [place] [address]  Cell: [phone]  Office Phone: [phone] Fax: [phone] 02/23/2023  11:23 AM

## 2023-02-23 NOTE — Progress Notes (Signed)
Post Partum Day 0   I was called to evaluate patient after she had a significant amount of clots after fundal rub. AS per RN patient released about 600 cc of clots about 1 hour after delivery. No more large clots expressed after initial passage. Patient reports feeling well, she denies light headedness or dizziness.   Objective: Blood pressure 127/88, pulse 84, temperature 98 F (36.7 C), temperature source Axillary, resp. rate 20, height 5\' 2"  (1.575 m), weight 108.1 kg, last menstrual period 05/25/2022, SpO2 100%, unknown if currently breastfeeding.  Physical Exam:  General: alert, cooperative, and no distress Lochia: appropriate Uterine Fundus: firm Incision: N/A  Recent Labs    02/23/23 0506  HGB 9.2*  HCT 30.2*    Assessment/Plan: 32 y/o Para 3 PPD # 0, -s/ total EBL from delivery of about 725 cc.Continue with Pitocin infusion, she is s/p IV tranexamic acid and IM methergine, c/w oral methergine series x 48 hours.  - Continue with close monitoring of bleeding.  - CBC check in the AM.   LOS: 0 days   Prescilla Sours, MD 02/23/2023, 11:20 PM

## 2023-02-23 NOTE — Progress Notes (Signed)
Labor Progress Note  Pamela Erickson is a 32 y.o. 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 Seen by rheumatologist 11/2017, on Plaquinil.  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 (with PAP 7/25) HSV (no lesion sor prodromal s/sx on valtrex since 37 weeks, outbreak in beginning of pregnancy)  large for gestation age fetus (91% 7.2lbs on 7/16) past pregnancy history of cesarean section (2014 for failure to progress and fetal intolerance, VBAC 2020, desires TOL for current pregnancy)  past pregnancy history of intrauterine fetal death (20 week, 2017/08/09, from heart block, hydrops.Marland Kitchen MFM recommendations: Weekly limited scan for heart block from 16 weeks, detailed anatomy scan 18-20 weeks, serial [place] for growth q 4 weeks, close antenatal surveillance 3rd trimester. NORMAL FETAL ECHOS THROUGH 30 WEEKS.) sickle cell trait (FOB Neg per MFM)   Subjective: Pt stable in bed, feeling painful cxt but able to mooooo thorough them. Still desires all natural and progressing in active labor.  Patient Active Problem List   Diagnosis Date Noted   Normal labor 02/23/2023   History of IUFD 10/29/2022   Obesity (BMI 35.0-39.9 without comorbidity) 04/20/2020   VBAC, delivered 09/19/2018   Sickle cell trait (HCC) 08/09/2017   History of cesarean section 07/20/2017   Objective: BP 123/72   Pulse 88   Temp 97.6 F (36.4 C) (Axillary)   Resp 17   Ht 5\' 2"  (1.575 m)   Wt 108.1 kg   LMP 05/25/2022 (Approximate)   SpO2 99%   BMI 43.60 kg/m  No intake/output data recorded. No intake/output data recorded. NST: FHR baseline 130 bpm, Variability: moderate, Accelerations:present, Decelerations:  Absent= Cat 1/Reactive CTX:  irregular, every 2-6  minutes Uterus gravid, soft non tender, moderate to palpate with contractions.  SVE:  Dilation: 7.5 Effacement (%): 80 Station: -2 Exam by:: [employer] CNM Pitocin at N/AmUn/min  Assessment:  Pamela Erickson is a 32 y.o. female, G4P2102, IUP at 39.1 weeks, TLOAC presenting for SROM, light meconium @ 0317am 8/18 witrh irregular cxt, in active labor.   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 Seen by rheumatologist 11/2017, on Plaquinil.  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 (with PAP 7/25) HSV (no lesion sor prodromal s/sx on valtrex since 37 weeks, outbreak in beginning of pregnancy)  large for gestation age fetus (91% 7.2lbs on 7/16) past pregnancy history of cesarean section (2014 for failure to progress and fetal intolerance, VBAC 2020, desires TOL for current pregnancy)  past pregnancy history of intrauterine fetal death (20 week, Aug 09, 2017, from heart block, hydrops.Marland Kitchen MFM recommendations: Weekly limited scan for heart block from 16 weeks, detailed anatomy scan 18-20 weeks, serial [place] for growth q 4 weeks, close antenatal surveillance 3rd trimester. NORMAL FETAL ECHOS THROUGH 30 WEEKS.) sickle cell trait (FOB Neg per MFM)  Patient Active Problem List   Diagnosis Date Noted   Normal labor 02/23/2023   History of IUFD 10/29/2022   Obesity (BMI 35.0-39.9 without comorbidity) 04/20/2020   VBAC, delivered 09/19/2018   Sickle cell trait (HCC) 08-09-17   History of cesarean section 07/20/2017   NICHD: Category 1  Membranes:  SROM, light meconium, no s/s of infection  Induction:  Cytotec xN/A TOLAC  Foley Bulb: N/A 7.5cm  Pitocin - Declined  Pain management:               IV pain management: x PRN  Nitrous: PRN             Epidural placement: PRN  GBS Positive  Abx: PCN @ 0530, 1114, 1540  Plan: Continue labor plan Continuous  monitoring Rest Ambulate Frequent position changes to facilitate fetal rotation and descent. Will reassess with cervical exam at 4 hours or earlier if necessary Expectant management.  GBS+ continue PCN Q4H until delivery.  Anticipate labor progression and vaginal delivery.   [facility] CNM, FNP-C, PMHNP-BC  [address]  [place], [place] [address]  Cell: [phone]  Office Phone: [phone] Fax: [phone] 02/23/2023  4:09 PM

## 2023-02-24 ENCOUNTER — Encounter (HOSPITAL_COMMUNITY): Payer: Self-pay | Admitting: Obstetrics & Gynecology

## 2023-02-24 LAB — CBC
HCT: 28.7 % — ABNORMAL LOW (ref 36.0–46.0)
Hemoglobin: 8.8 g/dL — ABNORMAL LOW (ref 12.0–15.0)
MCH: 20 pg — ABNORMAL LOW (ref 26.0–34.0)
MCHC: 30.7 g/dL (ref 30.0–36.0)
MCV: 65.1 fL — ABNORMAL LOW (ref 80.0–100.0)
Platelets: 189 10*3/uL (ref 150–400)
RBC: 4.41 MIL/uL (ref 3.87–5.11)
RDW: 18.1 % — ABNORMAL HIGH (ref 11.5–15.5)
WBC: 9.1 10*3/uL (ref 4.0–10.5)
nRBC: 0 % (ref 0.0–0.2)

## 2023-02-24 MED ORDER — FERROUS SULFATE 325 (65 FE) MG PO TABS
325.0000 mg | ORAL_TABLET | Freq: Two times a day (BID) | ORAL | Status: DC
  Administered 2023-02-24 – 2023-02-25 (×2): 325 mg via ORAL
  Filled 2023-02-24 (×2): qty 1

## 2023-02-24 MED ORDER — DOCUSATE SODIUM 100 MG PO CAPS
100.0000 mg | ORAL_CAPSULE | Freq: Two times a day (BID) | ORAL | Status: DC
  Administered 2023-02-24 – 2023-02-25 (×3): 100 mg via ORAL
  Filled 2023-02-24 (×3): qty 1

## 2023-02-24 NOTE — Lactation Note (Signed)
This note was copied from a baby's chart. Lactation Consultation Note Experienced BF mom stated the baby hadn't BF since 2100. LC attempted to get baby to BF and baby had no interest in BF at this time. LC hand expressed colostrum into baby's mouth but baby still wan't interested in BF. Newborn feeding habits, STS, I&O, positioning, support reviewed. Mom encouraged to feed baby 8-12 times/24 hours and with feeding cues.  Encouraged to try w/cues and every 3 hrs. Call for assistance as needed.  Patient Name: Pamela Erickson [id number] Date: 02/24/2023 Age:32 hours Reason for consult: Initial assessment;Term   Maternal Data Has patient been taught Hand Expression?: Yes Does the patient have breastfeeding experience prior to this delivery?: Yes How long did the patient breastfeed?: 6 months 1st child, 10 months 2nd child  Feeding    LATCH Score Latch: Too sleepy or reluctant, no latch achieved, no sucking elicited.  Audible Swallowing: None  Type of Nipple: Everted at rest and after stimulation  Comfort (Breast/Nipple): Soft / non-tender  Hold (Positioning): Assistance needed to correctly position infant at breast and maintain latch.  LATCH Score: 5   Lactation Tools Discussed/Used    Interventions Interventions: Breast feeding basics reviewed;Assisted with latch;Skin to skin;Breast massage;Hand express;Adjust position;Support pillows;Position options;LC Services brochure  Discharge    Consult Status Consult Status: Follow-up Date: 02/24/23 Follow-up type: In-patient    [name], Diamond Nickel 02/24/2023, 2:06 AM

## 2023-02-24 NOTE — Progress Notes (Signed)
Post Partum Day 1 Subjective: no complaints, up ad lib, voiding, and tolerating PO  Objective: Blood pressure 115/80, pulse 83, temperature 98.3 F (36.8 C), temperature source Oral, resp. rate 18, height 5\' 2"  (1.575 m), weight 108.1 kg, last menstrual period 05/25/2022, SpO2 98%, unknown if currently breastfeeding.  Physical Exam:  General: alert and cooperative Lochia: appropriate Uterine Fundus: firm Incision:  DVT Evaluation: No evidence of DVT seen on physical exam.  Recent Labs    02/23/23 0506 02/24/23 0705  HGB 9.2* 8.8*  HCT 30.2* 28.7*    Assessment/Plan: Plan for discharge tomorrow, Breastfeeding, and Circumcision prior to discharge Iron and colace   LOS: 1 day   Michael Litter, MD 02/24/2023, 2:16 PM

## 2023-02-24 NOTE — Discharge Instructions (Signed)
  Pamela Erickson, 1. Do not do any heavy lifting, i.e nothing heavier than 30 lbs for the next 6 weeks.  2.  Do not use tampons or douche or take baths, do not have any sexual intercourse or anything inside the vagina for the next 6 weeks.  3. Take your pain medication as needed for pain, let us know if the pain is not well controlled despite pain medication use.  4. Take your iron tablets daily for anemia.  You may also take a stool softener e.g colace if you are constipated.    5.  If you get a fever while at home, do check your temperature and if it is equal to or greater than 100.4 please call the office.   6. Some vaginal bleeding is expected and normal after your delivery. Please let us know if if it excessive where you saturate 1 pad in less than 2 hours or so.  7. Please let us know if with depression or anxiety symptoms.    [facility] [phone].

## 2023-02-24 NOTE — Anesthesia Postprocedure Evaluation (Signed)
Anesthesia Post Note  Patient: Pamela Erickson  Procedure(s) Performed: AN AD HOC LABOR EPIDURAL     Patient location during evaluation: Mother Baby Anesthesia Type: Epidural Level of consciousness: awake and alert Pain management: pain level controlled Vital Signs Assessment: post-procedure vital signs reviewed and stable Respiratory status: spontaneous breathing, nonlabored ventilation and respiratory function stable Cardiovascular status: stable Postop Assessment: no headache, no backache and epidural receding Anesthetic complications: no   No notable events documented.  Last Vitals:  Vitals:   02/24/23 0330 02/24/23 0751  BP: 114/68 116/81  Pulse: 75 82  Resp: 20 20  Temp: 36.6 C 36.8 C  SpO2: 100% 98%    Last Pain:  Vitals:   02/24/23 0805  TempSrc:   PainSc: 4    Pain Goal: Patients Stated Pain Goal: 0 (02/23/23 1710)              Epidural/Spinal Function Cutaneous sensation: Normal sensation (02/24/23 0751), Patient able to flex knees: Yes (02/24/23 0751), Patient able to lift hips off bed: Yes (02/24/23 0751), Back pain beyond tenderness at insertion site: No (02/24/23 0751), Progressively worsening motor and/or sensory loss: No (02/24/23 0751)  Marrion Coy

## 2023-02-24 NOTE — Lactation Note (Signed)
This note was copied from a baby's chart. Lactation Consultation Note  Patient Name: Pamela Erickson [id number] Date: 02/24/2023 Age:32 hours Reason for consult: Follow-up assessment  P3, Baby out of room for circumcision.  Mother states feedings are going well and does not have questions or concerns at this time.  She plans to breastfeed and formula feed.  Discussed cluster feeding and fussiness on the night of circumcision.   Mother will call if help is needed.    Maternal Data Has patient been taught Hand Expression?: Yes  Feeding Mother's Current Feeding Choice: Breast Milk and Formula  Interventions Interventions: Education  Consult Status Consult Status: PRN    Hardie Pulley 02/24/2023, 2:33 PM

## 2023-02-25 MED ORDER — IBUPROFEN 600 MG PO TABS: 600.0000 mg | ORAL_TABLET | Freq: Four times a day (QID) | ORAL | 0 refills | Status: DC

## 2023-02-25 MED ORDER — FERROUS SULFATE 325 (65 FE) MG PO TABS: 325.0000 mg | ORAL_TABLET | ORAL | 0 refills | Status: DC

## 2023-02-25 NOTE — Discharge Summary (Signed)
Postpartum Discharge Summary    Patient Name: Pamela Erickson DOB: 09-05-1990 MRN: [id number]  Date of admission: 02/23/2023 Delivery date:02/23/2023 Delivering provider: Dale Toyah Date of discharge: 02/25/2023  Admitting diagnosis: Normal labor [O80, Z37.9] Intrauterine pregnancy: [redacted]w[redacted]d     Secondary diagnosis:  Principal Problem:   VBAC (vaginal birth after Cesarean) Active Problems:   Normal labor   Normal postpartum course  Additional problems: Sjogren's syndrome.    Discharge diagnosis: Term Pregnancy Delivered and VBAC                                              Post partum procedures: None Augmentation: AROM and Pitocin Complications: None  Hospital course: Onset of Labor With Vaginal Delivery      32 y.o. yo [id number] at [redacted]w[redacted]d was admitted in Latent Labor on 02/23/2023. Labor course was not complicated. Membrane Rupture Time/Date: 3:17 AM,02/23/2023  Delivery Method:VBAC, Spontaneous Operative Delivery:N/A Episiotomy: None Lacerations:  None  Patient had a normal postpartum course.  She is ambulating, tolerating a regular diet, passing flatus, and urinating well. Patient is discharged home in stable condition on 02/25/23.  Newborn Data: Birth date:02/23/2023 Birth time:6:51 PM Gender:Female Living status:Living Apgars:9 ,9  Weight:3.83 kg  Physical exam  Vitals:   02/24/23 1136 02/24/23 1530 02/24/23 2127 02/25/23 0450  BP: 115/80 111/67 120/71 112/80  Pulse: 83 79 74 77  Resp: 18 18 18 18   Temp: 98.3 F (36.8 C) 98.4 F (36.9 C) 97.9 F (36.6 C) 98.1 F (36.7 C)  TempSrc: Oral Oral Oral Oral  SpO2: 98%  99% 100%  Weight:      Height:       General: alert, cooperative, and no distress Lochia: appropriate Uterine Fundus: firm Incision: N/A DVT Evaluation: No evidence of DVT seen on physical exam. Calf/Ankle edema is present Labs: Lab Results  Component Value Date   WBC 9.1 02/24/2023   HGB 8.8 (L) 02/24/2023   HCT 28.7 (L) 02/24/2023   MCV  65.1 (L) 02/24/2023   PLT 189 02/24/2023      Latest Ref Rng & Units 02/17/2020    3:28 PM  CMP  Glucose 65 - 99 mg/dL 79   BUN 6 - 20 mg/dL 9   Creatinine 9.62 - 9.52 mg/dL 8.41   Sodium 324 - 401 mmol/L 139   Potassium 3.5 - 5.2 mmol/L 4.0   Chloride 96 - 106 mmol/L 104   CO2 20 - 29 mmol/L 22   Calcium 8.7 - 10.2 mg/dL 8.9   Total Protein 6.0 - 8.5 g/dL 8.4   Total Bilirubin 0.0 - 1.2 mg/dL 0.3   Alkaline Phos 48 - 121 IU/L 66   AST 0 - 40 IU/L 20   ALT 0 - 32 IU/L 12       Latest Ref Rng & Units 02/24/2023    7:05 AM 02/23/2023    5:06 AM 02/17/2020    3:28 PM  CBC  WBC 4.0 - 10.5 K/uL 9.1  4.5  4.2   Hemoglobin 12.0 - 15.0 g/dL 8.8  9.2  02.7   Hematocrit 36.0 - 46.0 % 28.7  30.2  37.2   Platelets 150 - 400 K/uL 189  191  464      Edinburgh Score:    02/23/2023   10:00 PM  Edinburgh Postnatal Depression Scale Screening Tool  I have  been able to laugh and see the funny side of things. 0  I have looked forward with enjoyment to things. 0  I have blamed myself unnecessarily when things went wrong. 0  I have been anxious or worried for no good reason. 0  I have felt scared or panicky for no good reason. 0  Things have been getting on top of me. 0  I have been so unhappy that I have had difficulty sleeping. 0  I have felt sad or miserable. 0  I have been so unhappy that I have been crying. 0  The thought of harming myself has occurred to me. 0  Edinburgh Postnatal Depression Scale Total 0     After visit meds:  Allergies as of 02/25/2023       Reactions   Cephalexin    Other reaction(s): other        Medication List     STOP taking these medications    aspirin EC 81 MG tablet   promethazine-dextromethorphan 6.25-15 MG/5ML syrup Commonly known as: PROMETHAZINE-DM   valACYclovir 500 MG tablet Commonly known as: VALTREX       TAKE these medications    ferrous sulfate 325 (65 FE) MG tablet Take 1 tablet (325 mg total) by mouth every other day.    hydroxychloroquine 200 MG tablet Commonly known as: PLAQUENIL Take 400 mg by mouth daily. Take 2 tablets daily   ibuprofen 600 MG tablet Commonly known as: ADVIL Take 1 tablet (600 mg total) by mouth every 6 (six) hours.   PRENATAL ADULT GUMMY/DHA/FA PO Take by mouth daily.   SODIUM FLUORIDE (DENTAL RINSE) 0.2 % Soln Take by mouth.   VITAMIN D PO Take by mouth.       Discharge home in stable condition Infant Feeding: Bottle and Breast Infant Disposition:home with mother Discharge instruction: per After Visit Summary and Postpartum booklet. Activity: Advance as tolerated. Pelvic rest for 6 weeks.  Diet: routine diet Future Appointments:No future appointments. Follow up Visit:  Follow-up Information     [facility], [facility]. Schedule an appointment as soon as possible for a visit in 6 week(s).   Specialty: Obstetrics and Gynecology Why: Postpartum check. Contact information: [address] [place] [address] [phone]                Anticipated Birth Control:  POPs   02/25/2023 Prescilla Sours, MD

## 2023-03-01 ENCOUNTER — Inpatient Hospital Stay (HOSPITAL_COMMUNITY)
Admission: RE | Admit: 2023-03-01 | Payer: Medicaid Other | Source: Home / Self Care | Admitting: Obstetrics and Gynecology

## 2023-03-01 ENCOUNTER — Inpatient Hospital Stay (HOSPITAL_COMMUNITY): Payer: Medicaid Other

## 2023-03-24 ENCOUNTER — Telehealth (HOSPITAL_COMMUNITY): Payer: Self-pay | Admitting: *Deleted

## 2023-03-24 NOTE — Telephone Encounter (Signed)
03/24/2023  Name: Pamela Erickson MRN: [id number] DOB: Nov 16, 1990  Reason for Call:  Transition of Care Hospital Discharge Call  Contact Status: Patient Contact Status: Complete  Language assistant needed: Interpreter Mode: Interpreter Not Needed        Follow-Up Questions: Do You Have Any Concerns About Your Health As You Heal From Delivery?: No Do You Have Any Concerns About Your Infants Health?: No  Edinburgh Postnatal Depression Scale:  In the Past 7 Days:    PHQ2-9 Depression Scale:     Discharge Follow-up: Edinburgh score requires follow up?: N/A ([facility] nurse visited patient today and patient said she scored "low".  She feels that she is doing well emotionally.) Patient was advised of the following resources:: Breastfeeding Support Group, Support Group  Post-discharge interventions: Reviewed Newborn Safe Sleep Practices  Salena Saner, RN 03/24/2023 16:10

## 2023-04-08 DIAGNOSIS — Z3040 Encounter for surveillance of contraceptives, unspecified: Secondary | ICD-10-CM | POA: Diagnosis not present

## 2023-05-21 ENCOUNTER — Ambulatory Visit
Admission: EM | Admit: 2023-05-21 | Discharge: 2023-05-21 | Disposition: A | Discharge status: Home / Self Care | Payer: Medicaid Other | Attending: Internal Medicine | Admitting: Internal Medicine

## 2023-05-21 DIAGNOSIS — R0789 Other chest pain: Secondary | ICD-10-CM

## 2023-05-21 MED ORDER — CYCLOBENZAPRINE HCL 5 MG PO TABS: 5.0000 mg | ORAL_TABLET | Freq: Two times a day (BID) | ORAL | 0 refills | Status: DC | PRN

## 2023-05-21 MED ORDER — IBUPROFEN 600 MG PO TABS: 600.0000 mg | ORAL_TABLET | Freq: Four times a day (QID) | ORAL | 0 refills | Status: AC | PRN

## 2023-05-21 NOTE — ED Provider Notes (Signed)
[facility]  Note:  This document was prepared using Conservation officer, historic buildings and may include unintentional dictation errors.  MRN: [id number] DOB: Jan 19, 1991  Subjective:   Pamela Erickson is a 32 y.o. female presenting for 1 day history of acute onset persistent moderate right-sided chest pain.  Symptoms are aggravated by taking a deep breath.  Ibuprofen has helped some.  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  No particular inciting factor.  No fever, cough, wheezing, sick symptoms.  No history of respiratory disorders.  No history of musculoskeletal disorders.  No history of asthma.  No smoking of any kind including cigarettes, cigars, vaping, marijuana use.    No current facility-administered medications for this encounter.  Current Outpatient Medications:    ferrous sulfate 325 (65 FE) MG tablet, Take 1 tablet (325 mg total) by mouth every other day., Disp: 30 tablet, Rfl: 0   hydroxychloroquine (PLAQUENIL) 200 MG tablet, Take 400 mg by mouth daily. Take 2 tablets daily, Disp: , Rfl:    ibuprofen (ADVIL) 600 MG tablet, Take 1 tablet (600 mg total) by mouth every 6 (six) hours., Disp: 30 tablet, Rfl: 0   Prenatal MV & Min w/FA-DHA (PRENATAL ADULT GUMMY/DHA/FA PO), Take by mouth daily., Disp: , Rfl:    SODIUM FLUORIDE, DENTAL RINSE, 0.2 % SOLN, Take by mouth., Disp: , Rfl:    VITAMIN D PO, Take by mouth., Disp: , Rfl:    Allergies  Allergen Reactions   Cephalexin     Other reaction(s): other    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   Low vitamin D level    Postpartum hemorrhage 09/17/2018   Sjogren's syndrome (HCC)    Sjogren's syndrome with keratoconjunctivitis sicca  (HCC) 02/27/2018   Torn ACL (anterior cruciate ligament) 2008   Trichomonas infection    UTI (urinary tract infection)    Vaginal Pap smear, abnormal    May 2018; biopsy was normal     Past Surgical History:  Procedure Laterality Date   ANTERIOR CRUCIATE LIGAMENT REPAIR  2008   CESAREAN SECTION N/A 04/25/2013   Procedure: CESAREAN SECTION;  Surgeon: Michael Litter, MD;  Location: [facility];  Service: Obstetrics;  Laterality: N/A;   COLPOSCOPY     endocervical curettage  2018    Family History  Problem Relation Age of Onset   Hypertension Mother    Hypertension Father    Diabetes Maternal Aunt    Hypertension Maternal Grandmother    Hypertension Maternal Grandfather     Social History   Tobacco Use   Smoking status: Never   Smokeless tobacco: Never  Vaping Use   Vaping status: Never Used  Substance Use Topics   Alcohol use: Not Currently    Comment: OCC   Drug use: Not Currently    Types: Marijuana    Comment: OCC LAST USE 06/2012    ROS   Objective:   Vitals: BP 137/87 (BP Location: Right Arm)   Pulse 91   Temp 98.7 F (37.1 C) (Oral)   Resp 18   SpO2 95% Comment: thick artifical nails  Breastfeeding  Yes   Physical Exam Constitutional:      General: She is not in acute distress.    Appearance: Normal appearance. She is well-developed. She is not ill-appearing, toxic-appearing or diaphoretic.  HENT:     Head: Normocephalic and atraumatic.     Nose: Nose normal.     Mouth/Throat:     Mouth: Mucous membranes are moist.  Eyes:     General: No scleral icterus.       Right eye: No discharge.        Left eye: No discharge.     Extraocular Movements: Extraocular movements intact.  Cardiovascular:     Rate and Rhythm: Normal rate and regular rhythm.     Heart sounds: Normal heart sounds. No murmur heard.    No friction rub. No gallop.  Pulmonary:     Effort: Pulmonary effort is normal. No respiratory distress.     Breath sounds: No stridor. No wheezing,  rhonchi or rales.  Chest:     Chest wall: Tenderness (over area outlined) present.    Skin:    General: Skin is warm and dry.  Neurological:     General: No focal deficit present.     Mental Status: She is alert and oriented to person, place, and time.  Psychiatric:        Mood and Affect: Mood normal.        Behavior: Behavior normal.     Assessment and Plan :   PDMP not reviewed this encounter.  1. Right-sided chest wall pain    Offered imaging but patient declined and I am in agreement.  Suspect musculoskeletal pain as it is reproducible.  Patient has a low PERC score.  Will defer ER visit for now.  Recommend conservative management that is amenable to breast-feeding.  Counseled patient on potential for adverse effects with medications prescribed/recommended today, ER and return-to-clinic precautions discussed, patient verbalized understanding.    Wallis Bamberg, [place] 05/22/23 [phone]

## 2023-05-21 NOTE — ED Triage Notes (Signed)
Pt reports right sided chest tightness, shortness of breath, right shoulder pain started this morning; pain on the side of right breast since last night. Short ness of breath and chest tightness is worse when taking a deep breath. Ibuprofen gives relief. Pt reports she is pumping.

## 2023-05-23 ENCOUNTER — Encounter (HOSPITAL_COMMUNITY): Payer: Self-pay

## 2023-05-23 ENCOUNTER — Emergency Department (HOSPITAL_COMMUNITY): Payer: Medicaid Other

## 2023-05-23 ENCOUNTER — Ambulatory Visit (HOSPITAL_COMMUNITY)
Admission: EM | Admit: 2023-05-23 | Discharge: 2023-05-23 | Disposition: A | Discharge status: Home / Self Care | Payer: Medicaid Other | Attending: Physician Assistant | Admitting: Physician Assistant

## 2023-05-23 ENCOUNTER — Other Ambulatory Visit: Payer: Self-pay

## 2023-05-23 ENCOUNTER — Emergency Department (HOSPITAL_COMMUNITY)
Admission: EM | Admit: 2023-05-23 | Discharge: 2023-05-23 | Discharge status: Against Medical Advice | Payer: Medicaid Other | Source: Home / Self Care

## 2023-05-23 ENCOUNTER — Inpatient Hospital Stay (HOSPITAL_COMMUNITY)
Admission: EM | Admit: 2023-05-23 | Discharge: 2023-05-29 | DRG: 871 | Disposition: A | Discharge status: Home / Self Care | Payer: Medicaid Other | Source: Ambulatory Visit | Attending: Internal Medicine | Admitting: Internal Medicine

## 2023-05-23 DIAGNOSIS — A419 Sepsis, unspecified organism: Principal | ICD-10-CM | POA: Diagnosis present

## 2023-05-23 DIAGNOSIS — R0682 Tachypnea, not elsewhere classified: Secondary | ICD-10-CM

## 2023-05-23 DIAGNOSIS — D509 Iron deficiency anemia, unspecified: Secondary | ICD-10-CM | POA: Diagnosis present

## 2023-05-23 DIAGNOSIS — J90 Pleural effusion, not elsewhere classified: Secondary | ICD-10-CM | POA: Diagnosis not present

## 2023-05-23 DIAGNOSIS — Z881 Allergy status to other antibiotic agents status: Secondary | ICD-10-CM

## 2023-05-23 DIAGNOSIS — Z8249 Family history of ischemic heart disease and other diseases of the circulatory system: Secondary | ICD-10-CM | POA: Diagnosis not present

## 2023-05-23 DIAGNOSIS — R000 Tachycardia, unspecified: Secondary | ICD-10-CM | POA: Diagnosis not present

## 2023-05-23 DIAGNOSIS — Z833 Family history of diabetes mellitus: Secondary | ICD-10-CM

## 2023-05-23 DIAGNOSIS — R0602 Shortness of breath: Secondary | ICD-10-CM

## 2023-05-23 DIAGNOSIS — J189 Pneumonia, unspecified organism: Secondary | ICD-10-CM | POA: Diagnosis not present

## 2023-05-23 DIAGNOSIS — R7989 Other specified abnormal findings of blood chemistry: Secondary | ICD-10-CM

## 2023-05-23 DIAGNOSIS — Z1152 Encounter for screening for COVID-19: Secondary | ICD-10-CM

## 2023-05-23 DIAGNOSIS — D573 Sickle-cell trait: Secondary | ICD-10-CM | POA: Diagnosis not present

## 2023-05-23 DIAGNOSIS — R918 Other nonspecific abnormal finding of lung field: Secondary | ICD-10-CM | POA: Diagnosis not present

## 2023-05-23 DIAGNOSIS — J9601 Acute respiratory failure with hypoxia: Secondary | ICD-10-CM | POA: Diagnosis not present

## 2023-05-23 DIAGNOSIS — Z6841 Body Mass Index (BMI) 40.0 and over, adult: Secondary | ICD-10-CM

## 2023-05-23 DIAGNOSIS — E871 Hypo-osmolality and hyponatremia: Secondary | ICD-10-CM | POA: Diagnosis present

## 2023-05-23 DIAGNOSIS — E66813 Obesity, class 3: Secondary | ICD-10-CM | POA: Diagnosis not present

## 2023-05-23 DIAGNOSIS — M3500 Sicca syndrome, unspecified: Secondary | ICD-10-CM | POA: Diagnosis not present

## 2023-05-23 DIAGNOSIS — Z79899 Other long term (current) drug therapy: Secondary | ICD-10-CM

## 2023-05-23 DIAGNOSIS — R590 Localized enlarged lymph nodes: Secondary | ICD-10-CM | POA: Diagnosis not present

## 2023-05-23 DIAGNOSIS — R0609 Other forms of dyspnea: Secondary | ICD-10-CM | POA: Diagnosis not present

## 2023-05-23 DIAGNOSIS — J168 Pneumonia due to other specified infectious organisms: Secondary | ICD-10-CM | POA: Diagnosis not present

## 2023-05-23 LAB — I-STAT CG4 LACTIC ACID, ED: Lactic Acid, Venous: 0.8 mmol/L (ref 0.5–1.9)

## 2023-05-23 LAB — I-STAT CHEM 8, ED
BUN: 9 mg/dL (ref 6–20)
Calcium, Ion: 1.02 mmol/L — ABNORMAL LOW (ref 1.15–1.40)
Chloride: 108 mmol/L (ref 98–111)
Creatinine, Ser: 0.8 mg/dL (ref 0.44–1.00)
Glucose, Bld: 97 mg/dL (ref 70–99)
HCT: 35 % — ABNORMAL LOW (ref 36.0–46.0)
Hemoglobin: 11.9 g/dL — ABNORMAL LOW (ref 12.0–15.0)
Potassium: 5.4 mmol/L — ABNORMAL HIGH (ref 3.5–5.1)
Sodium: 138 mmol/L (ref 135–145)
TCO2: 22 mmol/L (ref 22–32)

## 2023-05-23 LAB — TROPONIN I (HIGH SENSITIVITY): Troponin I (High Sensitivity): 35 ng/L — ABNORMAL HIGH (ref <18)

## 2023-05-23 MED ORDER — ONDANSETRON HCL 4 MG/2ML IJ SOLN
4.0000 mg | Freq: Four times a day (QID) | INTRAMUSCULAR | Status: DC | PRN
  Administered 2023-05-25: 4 mg via INTRAVENOUS
  Filled 2023-05-23: qty 2

## 2023-05-23 MED ORDER — IOHEXOL 350 MG/ML SOLN
75.0000 mL | Freq: Once | INTRAVENOUS | Status: AC | PRN
  Administered 2023-05-23: 75 mL via INTRAVENOUS

## 2023-05-23 MED ORDER — HYDROMORPHONE HCL 1 MG/ML IJ SOLN
0.5000 mg | Freq: Once | INTRAMUSCULAR | Status: AC
  Administered 2023-05-23: 0.5 mg via INTRAVENOUS
  Filled 2023-05-23: qty 1

## 2023-05-23 MED ORDER — SODIUM CHLORIDE 0.9 % IV SOLN
2.0000 g | Freq: Once | INTRAVENOUS | Status: AC
  Administered 2023-05-24: 2 g via INTRAVENOUS
  Filled 2023-05-23: qty 20

## 2023-05-23 MED ORDER — OXYCODONE HCL 5 MG PO TABS
5.0000 mg | ORAL_TABLET | ORAL | Status: DC | PRN
  Administered 2023-05-24 – 2023-05-28 (×10): 5 mg via ORAL
  Filled 2023-05-23 (×10): qty 1

## 2023-05-23 MED ORDER — ACETAMINOPHEN 325 MG PO TABS
650.0000 mg | ORAL_TABLET | Freq: Four times a day (QID) | ORAL | Status: DC | PRN
  Administered 2023-05-24 – 2023-05-28 (×9): 650 mg via ORAL
  Filled 2023-05-23 (×9): qty 2

## 2023-05-23 MED ORDER — HYDROMORPHONE HCL 1 MG/ML IJ SOLN
0.5000 mg | INTRAMUSCULAR | Status: DC | PRN
  Administered 2023-05-24 (×2): 0.5 mg via INTRAVENOUS
  Filled 2023-05-23: qty 0.5
  Filled 2023-05-23 (×2): qty 1

## 2023-05-23 NOTE — ED Notes (Signed)
Pt ambulatory to bathroom

## 2023-05-23 NOTE — ED Notes (Signed)
Reported off to [place] RN at [facility] ED

## 2023-05-23 NOTE — ED Provider Notes (Signed)
[facility]    CSN: [id number] Arrival date & time: 05/23/23  1904      History   Chief Complaint Chief Complaint  Patient presents with   Shortness of Breath    HPI Pamela Erickson is a 32 y.o. female.   Patient presents today with a 3-day history of right back/chest pain.  She reports that the pain is rated 10 on a certain pain scale, described as sharp, worse with deep breathing, no alleviating factors identified.  She reports associated shortness of breath that has worsened today prompting evaluation.  She was seen by different provider and started on ibuprofen medication which has provided some relief of symptoms.  She denies any additional symptoms including fever, cough, congestion.  She denies any personal or family history of VTE event.  She did have a baby 3 months ago and is breast-feeding but is not taking any exogenous hormones including birth control.  Denies personal history of malignancy.  Denies episodes of similar symptoms in the past.    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   Low vitamin D level    Postpartum hemorrhage 09/17/2018   Sjogren's syndrome (HCC)    Sjogren's syndrome with keratoconjunctivitis sicca (HCC) 02/27/2018   Torn ACL (anterior cruciate ligament) 2008   Trichomonas infection    UTI (urinary tract infection)    Vaginal Pap smear, abnormal    May 2018; biopsy was normal    Patient Active Problem List   Diagnosis Date Noted   Normal labor 02/23/2023   SVD (spontaneous vaginal delivery) 02/23/2023   Normal postpartum course 02/23/2023   History of IUFD 10/29/2022   Obesity (BMI 35.0-39.9 without comorbidity) 04/20/2020   VBAC (vaginal birth after Cesarean) 09/19/2018   Sickle cell trait  (HCC) 08/01/2017   History of cesarean section 07/20/2017    Past Surgical History:  Procedure Laterality Date   ANTERIOR CRUCIATE LIGAMENT REPAIR  2008   CESAREAN SECTION N/A 04/25/2013   Procedure: CESAREAN SECTION;  Surgeon: Michael Litter, MD;  Location: [facility];  Service: Obstetrics;  Laterality: N/A;   COLPOSCOPY     endocervical curettage  2018    OB History     Gravida  4   Para  4   Term  3   Preterm  1   AB  0   Living  3      SAB  0   IAB  0   Ectopic  0   Multiple  0   Live Births  3            Home Medications    Prior to Admission medications   Medication Sig Start Date End Date Taking? Authorizing Provider  ibuprofen (ADVIL) 600 MG tablet Take 1 tablet (600 mg total) by mouth every 6 (six) hours as needed. 05/21/23  Yes Wallis Bamberg, PA-C  Prenatal MV & Min w/FA-DHA (PRENATAL ADULT GUMMY/DHA/FA PO) Take by mouth daily.   Yes [provider]  cyclobenzaprine (FLEXERIL) 5 MG tablet Take 1 tablet (5 mg total) by mouth 2 (two) times daily as needed for muscle spasms. 05/21/23   Wallis Bamberg, PA-C  ferrous sulfate 325 (65 FE) MG tablet Take 1 tablet (325 mg total) by mouth every other day. 02/25/23   Hoover Browns, MD  hydroxychloroquine (PLAQUENIL) 200 MG tablet Take 400 mg by mouth daily. Take 2 tablets daily    [provider]  SODIUM FLUORIDE, DENTAL RINSE, 0.2 % SOLN Take by mouth. 08/30/22   [provider]  VITAMIN D PO Take by mouth.    [provider]    Family History Family History  Problem Relation Age of Onset   Hypertension Mother    Hypertension Father    Diabetes Maternal Aunt    Hypertension Maternal Grandmother    Hypertension Maternal Grandfather     Social History Social History   Tobacco Use   Smoking status: Never   Smokeless tobacco: Never  Vaping Use   Vaping status: Never Used  Substance Use Topics   Alcohol use: Not Currently    Comment: OCC   Drug use: Not Currently     Types: Marijuana    Comment: OCC LAST USE 06/2012     Allergies   Cephalexin   Review of Systems Review of Systems  Constitutional:  Positive for activity change. Negative for appetite change, fatigue and fever.  Respiratory:  Positive for shortness of breath. Negative for cough.   Cardiovascular:  Positive for chest pain. Negative for palpitations and leg swelling.     Physical Exam Triage Vital Signs ED Triage Vitals  Encounter Vitals Group     BP 05/23/23 1909 (!) 141/93     Systolic BP Percentile --      Diastolic BP Percentile --      Pulse Rate 05/23/23 1909 (!) 131     Resp 05/23/23 1909 (!) 40     Temp 05/23/23 1909 98.9 F (37.2 C)     Temp Source 05/23/23 1909 Oral     SpO2 05/23/23 1909 100 %     Weight --      Height --      Head Circumference --      Peak Flow --      Pain Score 05/23/23 1911 10     Pain Loc --      Pain Education --      Exclude from Growth Chart --    No data found.  Updated Vital Signs BP (!) 141/93 (BP Location: Right Arm)   Pulse (!) 131   Temp 98.9 F (37.2 C) (Oral)   Resp (!) 40   SpO2 100%   Breastfeeding Yes   Visual Acuity Right Eye Distance:   Left Eye Distance:   Bilateral Distance:    Right Eye Near:   Left Eye Near:    Bilateral Near:     Physical Exam Vitals reviewed.  Constitutional:      General: She is awake. She is not in acute distress.    Appearance: Normal appearance. She is well-developed. She is ill-appearing.     Comments: Very pleasant female appears stated age  HENT:     Head: Normocephalic and atraumatic.  Cardiovascular:     Rate and Rhythm: Regular rhythm. Tachycardia present.     Heart sounds: Normal heart sounds, S1 normal and S2 normal. No murmur heard. Pulmonary:     Effort: Pulmonary effort is normal.     Breath sounds: Normal breath sounds. No wheezing, rhonchi or rales.     Comments: Clear to auscultation bilaterally Chest:     Chest wall: No deformity, swelling or  tenderness.  Musculoskeletal:     Cervical back: No tenderness or bony tenderness.     Thoracic back: No tenderness or bony tenderness.     Lumbar back: No tenderness or bony tenderness.     Right lower leg: No edema.     Left lower leg: No edema.     Comments: No tenderness palpation.  Pain is not reproducible on exam.  Psychiatric:        Behavior: Behavior is cooperative.      UC Treatments / Results  Labs (all labs ordered are listed, but only abnormal results are displayed) Labs Reviewed - No data to display  EKG   Radiology No results found.  Procedures Procedures (including critical care time)  Medications Ordered in UC Medications - No data to display  Initial Impression / Assessment and Plan / UC Course  I have reviewed the triage vital signs and the nursing notes.  Pertinent labs & imaging results that were available during my care of the patient were reviewed by me and considered in my medical decision making (see chart for details).     Patient is tachycardic, tachypneic, ill-appearing.  I am concerned she has a PE and so discussed that she needs to go to the emergency room.  EKG was obtained that showed sinus tachycardia with ventricular rate of 132 without ischemic changes.  She drove herself so we will send by [facility] given her abnormal vital signs.   Final Clinical Impressions(s) / UC Diagnoses   Final diagnoses:  Shortness of breath  Tachycardia  Tachypnea   Discharge Instructions   None    ED Prescriptions   None    PDMP not reviewed this encounter.   Jeani Hawking, PA-C 05/23/23 1951

## 2023-05-23 NOTE — ED Triage Notes (Signed)
Patient having sob, labored breathing and sharp right side back pain for a few days. No sick symptoms, no cough, no asthma, works at a  day care. No falls or trauma.

## 2023-05-23 NOTE — ED Notes (Signed)
To ct

## 2023-05-23 NOTE — ED Notes (Signed)
1st lactic 0.78 in normal range 2nd not needed

## 2023-05-23 NOTE — ED Provider Notes (Signed)
[facility] AT [facility] Provider Note   CSN: [id number] Arrival date & time: 05/23/23  2001     History  Chief Complaint  Patient presents with   Tachycardia    Pamela Erickson is a 32 y.o. female who presents for tachycardia and chest pain.  Patient has had 3 days of right-sided back and chest pleuritic pain that is sharp.  Associated with shortness of breath that has worsened.  Previously seen at urgent care on 11/13 for similar symptoms and was discharged on ibuprofen.  Symptoms worsened which prompted today's evaluation.  She initially presented to urgent care again but was sent here due to concern for pulmonary embolism.  Denies fever or chills.  No leg swelling recent travel.  No prior VTE.  No Personal cardiac history  The history is provided by the patient and medical records.       Home Medications Prior to Admission medications   Medication Sig Start Date End Date Taking? Authorizing Provider  cyclobenzaprine (FLEXERIL) 5 MG tablet Take 1 tablet (5 mg total) by mouth 2 (two) times daily as needed for muscle spasms. 05/21/23   Wallis Bamberg, PA-C  ferrous sulfate 325 (65 FE) MG tablet Take 1 tablet (325 mg total) by mouth every other day. 02/25/23   Hoover Browns, MD  hydroxychloroquine (PLAQUENIL) 200 MG tablet Take 400 mg by mouth daily. Take 2 tablets daily    [provider]  ibuprofen (ADVIL) 600 MG tablet Take 1 tablet (600 mg total) by mouth every 6 (six) hours as needed. 05/21/23   Wallis Bamberg, PA-C  Prenatal MV & Min w/FA-DHA (PRENATAL ADULT GUMMY/DHA/FA PO) Take by mouth daily.    [provider]  SODIUM FLUORIDE, DENTAL RINSE, 0.2 % SOLN Take by mouth. 08/30/22   [provider]  VITAMIN D PO Take by mouth.    [provider]      Allergies    Cephalexin    Review of Systems   Review of Systems per HPI above  Physical Exam Updated Vital Signs BP (!) 132/91   Pulse (!) 113   Temp 98.3 F  (36.8 C) (Oral)   Resp (!) 27   Ht 5\' 2"  (1.575 m)   Wt 103.4 kg   SpO2 99%   BMI 41.70 kg/m  Physical Exam Vitals and nursing note reviewed.  Constitutional:      General: She is not in acute distress.    Appearance: She is well-developed.  HENT:     Head: Normocephalic and atraumatic.  Eyes:     Conjunctiva/sclera: Conjunctivae normal.  Cardiovascular:     Rate and Rhythm: Normal rate and regular rhythm.     Heart sounds: No murmur heard. Pulmonary:     Effort: Pulmonary effort is normal. No respiratory distress.     Breath sounds: Normal breath sounds.  Abdominal:     Palpations: Abdomen is soft.     Tenderness: There is no abdominal tenderness.  Musculoskeletal:        General: No swelling.     Cervical back: Neck supple.  Skin:    General: Skin is warm and dry.     Capillary Refill: Capillary refill takes less than 2 seconds.  Neurological:     Mental Status: She is alert.  Psychiatric:        Mood and Affect: Mood normal.     ED Results / Procedures / Treatments   Labs (all labs ordered are listed, but  only abnormal results are displayed) Labs Reviewed  I-STAT CHEM 8, ED - Abnormal; Notable for the following components:      Result Value   Potassium 5.4 (*)    Calcium, Ion 1.02 (*)    Hemoglobin 11.9 (*)    HCT 35.0 (*)    All other components within normal limits  TROPONIN I (HIGH SENSITIVITY) - Abnormal; Notable for the following components:   Troponin I (High Sensitivity) 35 (*)    All other components within normal limits  CULTURE, BLOOD (ROUTINE X 2)  CULTURE, BLOOD (ROUTINE X 2)  EXPECTORATED SPUTUM ASSESSMENT W GRAM STAIN, RFLX TO RESP C  CBC WITH DIFFERENTIAL/PLATELET  HCG, SERUM, QUALITATIVE  COMPREHENSIVE METABOLIC PANEL  BRAIN NATRIURETIC PEPTIDE  CBC WITH DIFFERENTIAL/PLATELET  I-STAT CG4 LACTIC ACID, ED  I-STAT CG4 LACTIC ACID, ED  TROPONIN I (HIGH SENSITIVITY)    EKG EKG Interpretation Date/Time:  Friday May 23 2023 20:05:36  EST Ventricular Rate:  122 PR Interval:  128 QRS Duration:  88 QT Interval:  317 QTC Calculation: 452 R Axis:   73  Text Interpretation: Sinus tachycardia Confirmed by Vonita Moss [phone]) on 05/23/2023 10:33:59 PM  Radiology CT Angio Chest PE W and/or Wo Contrast  Result Date: 05/23/2023 CLINICAL DATA:  Tachycardia EXAM: CT ANGIOGRAPHY CHEST WITH CONTRAST TECHNIQUE: Multidetector CT imaging of the chest was performed using the standard protocol during bolus administration of intravenous contrast. Multiplanar CT image reconstructions and MIPs were obtained to evaluate the vascular anatomy. RADIATION DOSE REDUCTION: This exam was performed according to the departmental dose-optimization program which includes automated exposure control, adjustment of the mA and/or kV according to patient size and/or use of iterative reconstruction technique. CONTRAST:  75mL OMNIPAQUE IOHEXOL 350 MG/ML SOLN COMPARISON:  None Available. FINDINGS: Cardiovascular: Satisfactory opacification of the pulmonary arteries to the segmental level. No evidence of pulmonary embolism. Normal cardiac size. No pericardial effusion. Nonaneurysmal aorta Mediastinum/Nodes: Midline trachea. No thyroid mass. Mildly enlarged bilateral axillary lymph nodes, on the right measuring up to 14 mm and on the left measuring up to 11 mm. Esophagus within normal limits. Lungs/Pleura: Small right-sided pleural effusion. Partial consolidations in the right greater than left lower lobes. Mild right middle lobe consolidation. Upper Abdomen: No acute finding Musculoskeletal: No acute osseous abnormality Review of the MIP images confirms the above findings. IMPRESSION: 1. Negative for acute pulmonary embolus. 2. Small right-sided pleural effusion. Partial consolidations in the right greater than left lower lobes and right middle lobe, suspicious for pneumonia. 3. Mildly enlarged bilateral axillary lymph nodes, nonspecific, possibly reactive.  Electronically Signed   By: Jasmine Pang M.D.   On: 05/23/2023 23:15    Procedures Procedures    Medications Ordered in ED Medications  cefTRIAXone (ROCEPHIN) 2 g in sodium chloride 0.9 % 100 mL IVPB (has no administration in time range)  HYDROmorphone (DILAUDID) injection 0.5 mg (0.5 mg Intravenous Given 05/23/23 2330)  iohexol (OMNIPAQUE) 350 MG/ML injection 75 mL (75 mLs Intravenous Contrast Given 05/23/23 2255)    ED Course/ Medical Decision Making/ A&P                                 Medical Decision Making Amount and/or Complexity of Data Reviewed Labs: ordered. Radiology: ordered.  Risk Prescription drug management. Decision regarding hospitalization.   32 year old female who presents for shortness of breath, pleuritic chest pain, and tachycardia.  Differential considered includes PE, ACS, SVT or other arrhythmia,  costochondritis, pneumothorax, pericarditis myocarditis, dissection amongst others.  Initially tachycardic in the 100s to 120s.  My independent interpretation of her telemetry appears to be sinus tach.  Blood pressure is normal.  She is afebrile. Tolerating room air. Exam is nonfocal. Not septic or ill appearing. Highest concern for a PE clinically; less consistent with dissection given normal BP and neurovascular exam. Labs, EKG, CT PE obtained.   EKG with sinus tachycardia rate of 122, normal axis and intervals, no ST segment elevations or depressions, no findings of pericarditis acute MI arrhythmia or arrhythmia predisposing condition.   CT PE study no acute PE though patient does have right lung pneumonia and small bilateral effusions.  This is likely the cause of her pain and symptoms as well as tachycardia.   Labs majority in process at time of admission.  Troponin mildly elevated at 35 suspect demand related.  Lactate normal.  I-STAT Chem-8 with K 5.4 likely hemolyzed, otherwise unremarkable normal creatinine.  Blood cultures, sputum culture added to  patient's evaluation.   Patient was given ceftriaxone for pneumonia after I confirmed her allergy to Keflex was a rash on her palms that she now believes was due to something other than the medication.  She has also tolerated Ancef in the past.  She is breast-feeding which limits antibiotic choices.  Given tachycardia requires admission for further care. Admitted to Dr. Antionette Char.           Final Clinical Impression(s) / ED Diagnoses Final diagnoses:  Pneumonia of right lung due to infectious organism, unspecified part of lung    Rx / DC Orders ED Discharge Orders     None         Karmen Stabs, MD 05/23/23 2344    Rondel Baton, MD 05/24/23 1120

## 2023-05-23 NOTE — ED Notes (Signed)
Patient is being discharged from the Urgent Care and sent to the Emergency Department via [facility]. Per Dorann Ou, PA, patient is in need of higher level of care due to Shortness of breath, tachycardia, tachypnea  Patient is aware and verbalizes understanding of plan of care.  Vitals:   05/23/23 1909  BP: (!) 141/93  Pulse: (!) 131  Resp: (!) 40  Temp: 98.9 F (37.2 C)  SpO2: 100%

## 2023-05-23 NOTE — ED Triage Notes (Signed)
Pt arrives via [facility] from urgent care. Sent for tachycardia of 120-130 and RR of 40-45. Pain to rt mid back that radiates down to waist. Pt delivered 39 week baby 3 months ago.

## 2023-05-24 ENCOUNTER — Inpatient Hospital Stay (HOSPITAL_COMMUNITY): Payer: Medicaid Other

## 2023-05-24 DIAGNOSIS — D509 Iron deficiency anemia, unspecified: Secondary | ICD-10-CM

## 2023-05-24 DIAGNOSIS — R0609 Other forms of dyspnea: Secondary | ICD-10-CM | POA: Diagnosis not present

## 2023-05-24 DIAGNOSIS — J189 Pneumonia, unspecified organism: Secondary | ICD-10-CM | POA: Diagnosis not present

## 2023-05-24 DIAGNOSIS — A419 Sepsis, unspecified organism: Secondary | ICD-10-CM | POA: Diagnosis not present

## 2023-05-24 DIAGNOSIS — J9601 Acute respiratory failure with hypoxia: Secondary | ICD-10-CM

## 2023-05-24 DIAGNOSIS — R7989 Other specified abnormal findings of blood chemistry: Secondary | ICD-10-CM

## 2023-05-24 LAB — COMPREHENSIVE METABOLIC PANEL
ALT: 17 U/L (ref 0–44)
AST: 24 U/L (ref 15–41)
Albumin: 3.1 g/dL — ABNORMAL LOW (ref 3.5–5.0)
Alkaline Phosphatase: 93 U/L (ref 38–126)
Anion gap: 8 (ref 5–15)
BUN: 7 mg/dL (ref 6–20)
CO2: 20 mmol/L — ABNORMAL LOW (ref 22–32)
Calcium: 8.6 mg/dL — ABNORMAL LOW (ref 8.9–10.3)
Chloride: 107 mmol/L (ref 98–111)
Creatinine, Ser: 0.83 mg/dL (ref 0.44–1.00)
GFR, Estimated: >60 mL/min (ref >60)
Glucose, Bld: 96 mg/dL (ref 70–99)
Potassium: 3.7 mmol/L (ref 3.5–5.1)
Sodium: 135 mmol/L (ref 135–145)
Total Bilirubin: 0.8 mg/dL (ref <1.2)
Total Protein: 8.2 g/dL — ABNORMAL HIGH (ref 6.5–8.1)

## 2023-05-24 LAB — HCG, SERUM, QUALITATIVE: Preg, Serum: NEGATIVE

## 2023-05-24 LAB — RESPIRATORY PANEL BY PCR

## 2023-05-24 LAB — I-STAT VENOUS BLOOD GAS, ED
Acid-base deficit: 3 mmol/L — ABNORMAL HIGH (ref 0.0–2.0)
Bicarbonate: 21.3 mmol/L (ref 20.0–28.0)
Calcium, Ion: 1.18 mmol/L (ref 1.15–1.40)
HCT: 32 % — ABNORMAL LOW (ref 36.0–46.0)
Hemoglobin: 10.9 g/dL — ABNORMAL LOW (ref 12.0–15.0)
O2 Saturation: 84 %
Potassium: 3.5 mmol/L (ref 3.5–5.1)
Sodium: 138 mmol/L (ref 135–145)
TCO2: 22 mmol/L (ref 22–32)
pCO2, Ven: 34.7 mm[Hg] — ABNORMAL LOW (ref 44–60)
pH, Ven: 7.396 (ref 7.25–7.43)
pO2, Ven: 49 mm[Hg] — ABNORMAL HIGH (ref 32–45)

## 2023-05-24 LAB — CBC WITH DIFFERENTIAL/PLATELET
Abs Immature Granulocytes: 0.05 10*3/uL (ref 0.00–0.07)
Basophils Absolute: 0 10*3/uL (ref 0.0–0.1)
Basophils Relative: 0 %
Eosinophils Absolute: 0 10*3/uL (ref 0.0–0.5)
Eosinophils Relative: 0 %
HCT: 33.1 % — ABNORMAL LOW (ref 36.0–46.0)
Hemoglobin: 10.4 g/dL — ABNORMAL LOW (ref 12.0–15.0)
Immature Granulocytes: 0 %
Lymphocytes Relative: 7 %
Lymphs Abs: 1 10*3/uL (ref 0.7–4.0)
MCH: 21.4 pg — ABNORMAL LOW (ref 26.0–34.0)
MCHC: 31.4 g/dL (ref 30.0–36.0)
MCV: 68 fL — ABNORMAL LOW (ref 80.0–100.0)
Monocytes Absolute: 1.8 10*3/uL — ABNORMAL HIGH (ref 0.1–1.0)
Monocytes Relative: 14 %
Neutro Abs: 10.1 10*3/uL — ABNORMAL HIGH (ref 1.7–7.7)
Neutrophils Relative %: 79 %
Platelets: 428 10*3/uL — ABNORMAL HIGH (ref 150–400)
RBC: 4.87 MIL/uL (ref 3.87–5.11)
RDW: 19.1 % — ABNORMAL HIGH (ref 11.5–15.5)
WBC: 12.9 10*3/uL — ABNORMAL HIGH (ref 4.0–10.5)
nRBC: 0 % (ref 0.0–0.2)

## 2023-05-24 LAB — ECHOCARDIOGRAM COMPLETE
Height: 62 in
S' Lateral: 3 cm
Weight: 3648 [oz_av]

## 2023-05-24 LAB — PROCALCITONIN: Procalcitonin: <0.1 ng/mL

## 2023-05-24 LAB — TSH: TSH: 0.105 u[IU]/mL — ABNORMAL LOW (ref 0.350–4.500)

## 2023-05-24 LAB — T4, FREE: Free T4: 1.09 ng/dL (ref 0.61–1.12)

## 2023-05-24 LAB — SEDIMENTATION RATE: Sed Rate: 45 mm/h — ABNORMAL HIGH (ref 0–22)

## 2023-05-24 LAB — C-REACTIVE PROTEIN: CRP: 20 mg/dL — ABNORMAL HIGH (ref <1.0)

## 2023-05-24 LAB — SARS CORONAVIRUS 2 BY RT PCR: SARS Coronavirus 2 by RT PCR: NEGATIVE

## 2023-05-24 LAB — IRON AND TIBC
Iron: 12 ug/dL — ABNORMAL LOW (ref 28–170)
Saturation Ratios: 3 % — ABNORMAL LOW (ref 10.4–31.8)
TIBC: 377 ug/dL (ref 250–450)
UIBC: 365 ug/dL

## 2023-05-24 LAB — I-STAT CG4 LACTIC ACID, ED: Lactic Acid, Venous: 0.8 mmol/L (ref 0.5–1.9)

## 2023-05-24 LAB — FERRITIN: Ferritin: 42 ng/mL (ref 11–307)

## 2023-05-24 LAB — HIV ANTIBODY (ROUTINE TESTING W REFLEX): HIV Screen 4th Generation wRfx: NONREACTIVE

## 2023-05-24 LAB — TROPONIN I (HIGH SENSITIVITY): Troponin I (High Sensitivity): 24 ng/L — ABNORMAL HIGH (ref <18)

## 2023-05-24 LAB — BRAIN NATRIURETIC PEPTIDE: B Natriuretic Peptide: 54.8 pg/mL (ref 0.0–100.0)

## 2023-05-24 MED ORDER — KETOROLAC TROMETHAMINE 30 MG/ML IJ SOLN
30.0000 mg | Freq: Once | INTRAMUSCULAR | Status: AC
  Administered 2023-05-24: 30 mg via INTRAVENOUS
  Filled 2023-05-24: qty 1

## 2023-05-24 MED ORDER — COMPLETENATE 29-1 MG PO CHEW
1.0000 | CHEWABLE_TABLET | Freq: Every day | ORAL | Status: DC
  Administered 2023-05-25 – 2023-05-29 (×5): 1 via ORAL
  Filled 2023-05-24 (×6): qty 1

## 2023-05-24 MED ORDER — LIDOCAINE 5 % EX PTCH
1.0000 | MEDICATED_PATCH | Freq: Every day | CUTANEOUS | Status: DC
  Administered 2023-05-24 – 2023-05-29 (×6): 1 via TRANSDERMAL
  Filled 2023-05-24 (×6): qty 1

## 2023-05-24 MED ORDER — ENOXAPARIN SODIUM 40 MG/0.4ML IJ SOSY
40.0000 mg | PREFILLED_SYRINGE | Freq: Every day | INTRAMUSCULAR | Status: DC
  Administered 2023-05-24 – 2023-05-29 (×6): 40 mg via SUBCUTANEOUS
  Filled 2023-05-24 (×6): qty 0.4

## 2023-05-24 MED ORDER — SODIUM CHLORIDE 0.9 % IV BOLUS
1000.0000 mL | Freq: Once | INTRAVENOUS | Status: AC
  Administered 2023-05-24: 1000 mL via INTRAVENOUS

## 2023-05-24 MED ORDER — DEXTROSE 5 % IV SOLN
500.0000 mg | Freq: Every day | INTRAVENOUS | Status: DC
  Administered 2023-05-24 – 2023-05-26 (×3): 500 mg via INTRAVENOUS
  Filled 2023-05-24 (×3): qty 5

## 2023-05-24 MED ORDER — CYCLOBENZAPRINE HCL 5 MG PO TABS: 5.0000 mg | ORAL_TABLET | Freq: Two times a day (BID) | ORAL | Status: DC | PRN

## 2023-05-24 MED ORDER — SODIUM CHLORIDE 0.9 % IV SOLN
2.0000 g | Freq: Every day | INTRAVENOUS | Status: AC
  Administered 2023-05-24 – 2023-05-28 (×5): 2 g via INTRAVENOUS
  Filled 2023-05-24 (×5): qty 20

## 2023-05-24 MED ORDER — LACTATED RINGERS IV BOLUS
1000.0000 mL | Freq: Once | INTRAVENOUS | Status: AC
  Administered 2023-05-24: 1000 mL via INTRAVENOUS

## 2023-05-24 MED ORDER — LEVALBUTEROL HCL 0.63 MG/3ML IN NEBU: 0.6300 mg | INHALATION_SOLUTION | Freq: Four times a day (QID) | RESPIRATORY_TRACT | Status: DC | PRN

## 2023-05-24 NOTE — ED Notes (Signed)
Provider contacted about pt heart rate.

## 2023-05-24 NOTE — ED Notes (Signed)
Pt using breast pump, mother at bedside.

## 2023-05-24 NOTE — H&P (Addendum)
History and Physical    Patient: Pamela Erickson VWU:[id number] DOB: [birth year] DOA: 05/23/2023 DOS: the patient was seen and examined on 05/24/2023 PCP: Arnette Felts, FNP  Patient coming from:  [facility]  - lives with her kids   Chief Complaint: shortness of breath/back pain.   HPI: Pamela Erickson is a 32 y.o. 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   On Wednesday morning she woke up and her back hurt and went to urgent care and they told her she had pulled a muscle and gave her a muscle relaxer. On Thursday her pain was worse and she was having shortness of breath. She also had some chills as well. She went back to urgent care yesterday and they sent her to ED due to tachycardia and shortness of breath. She has dyspnea on exertion as well. She has no chest pain and denies palpitations.   She does work at a day care, but no known sick contacts. They have a common cold. Her 32 year old has a cough at night as well. She denies any upper respiratory symptoms including congestion, sore throat or ear pain. She has had no cough.   She does not smoke or drink alcohol.   ER Course:  vitals :afebrile, bp: 128/86, HR: 117, RR: 32, oxygen: 100%RA Pertinent labs: WBC; 12.4, hgb: 10.4, co2: 20, troponin: 35>24,  CTA chest: negative for PE. Small right sided pleural effusion. Partial consolidations in the right greater than left lower lobes and right middle lobe, suspicious for pneumonia. Mildly enlarged bilateral axillary lymph nodes, nonspecific, possibly reactive.  In ED: BC obtained. Given rocephin, 1L IVF bolus and TRH asked to admit.    Review of Systems: As mentioned in the history of present illness. All other systems reviewed and are negative. 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   Low vitamin D level    Postpartum hemorrhage 09/17/2018   Sjogren's syndrome (HCC)    Sjogren's syndrome with keratoconjunctivitis sicca (HCC) 02/27/2018   Torn ACL (anterior cruciate ligament) 2008   Trichomonas infection    UTI (urinary tract infection)    Vaginal Pap smear, abnormal    May 2018; biopsy was normal   Past Surgical History:  Procedure Laterality Date   ANTERIOR CRUCIATE LIGAMENT REPAIR  2008   CESAREAN SECTION N/A 04/25/2013   Procedure: CESAREAN SECTION;  Surgeon: Michael Litter, MD;  Location: [facility];  Service: Obstetrics;  Laterality: N/A;   COLPOSCOPY     endocervical curettage  2018   Social History:  reports that she has never smoked. She has never used smokeless tobacco. She reports that she does not currently use alcohol. She reports that she does not currently use drugs after having used the following drugs: Marijuana.  Allergies  Allergen Reactions   Cephalexin     Patient was diagnosed with a disease process, and is not allergic to this medication.     Family History  Problem Relation Age  of Onset   Hypertension Mother    Hypertension Father    Diabetes Maternal Aunt    Hypertension Maternal Grandmother    Hypertension Maternal Grandfather     Prior to Admission medications   Medication Sig Start Date End Date Taking? Authorizing Provider  cyclobenzaprine (FLEXERIL) 5 MG tablet Take 1 tablet (5 mg total) by mouth 2 (two) times daily as needed for muscle spasms. 05/21/23  Yes Wallis Bamberg, PA-C  ibuprofen (ADVIL) 600 MG tablet Take 1 tablet (600 mg total) by mouth every 6 (six) hours as needed. 05/21/23  Yes Wallis Bamberg, PA-C  norethindrone (MICRONOR) 0.35 MG tablet Take 1 tablet by mouth daily. 04/09/23  Yes [provider]  Prenatal MV & Min w/FA-DHA (PRENATAL ADULT GUMMY/DHA/FA PO) Take by mouth daily.   Yes [provider]  valACYclovir (VALTREX) 500 MG tablet Take 500 mg by mouth as needed. 05/20/23  Yes [provider]    Physical Exam: Vitals:   05/24/23 0630 05/24/23 0819 05/24/23 0835 05/24/23 1000  BP: 121/84  (!) 138/96 (!) 127/90  Pulse: (!) 123 (!) 114 (!) 125 (!) 122  Resp: 18 (!) 21 (!) 29 20  Temp:      TempSrc:      SpO2: 94% 98% 97% 94%  Weight:      Height:       General:  Appears calm and comfortable and is in NAD Eyes:  PERRL, EOMI, normal lids, iris ENT:  grossly normal hearing, lips & tongue, mmm; appropriate dentition Neck:  no LAD, masses or thyromegaly; no carotid bruits Cardiovascular:  Rate irregular, normal rhythm , no m/r/g. No LE edema.  Respiratory:   decreased breath sound in RLL and mid lobe, otherwise clear exam with no wheezing or rales. Tachyonic.  Abdomen:  soft, NT, ND, NABS Back:   normal alignment, no CVAT Skin:  no rash or induration seen on limited exam Musculoskeletal:  grossly normal tone BUE/BLE, good ROM, no bony abnormality Lower extremity:  No LE edema.  Limited foot exam with no ulcerations.  2+ distal pulses. Psychiatric:  grossly normal mood and affect, speech fluent and appropriate, AOx3 Neurologic:  CN 2-12 grossly intact, moves all extremities in coordinated fashion, sensation intact   Radiological Exams on Admission: Independently reviewed - see discussion in A/P where applicable  CT Angio Chest PE W and/or Wo Contrast  Result Date: 05/23/2023 CLINICAL DATA:  Tachycardia EXAM: CT ANGIOGRAPHY CHEST WITH CONTRAST TECHNIQUE: Multidetector CT imaging of the chest was performed using the standard protocol during bolus administration of intravenous contrast. Multiplanar CT image reconstructions and MIPs were obtained to evaluate the vascular anatomy. RADIATION DOSE REDUCTION: This exam was performed according to the  departmental dose-optimization program which includes automated exposure control, adjustment of the mA and/or kV according to patient size and/or use of iterative reconstruction technique. CONTRAST:  75mL OMNIPAQUE IOHEXOL 350 MG/ML SOLN COMPARISON:  None Available. FINDINGS: Cardiovascular: Satisfactory opacification of the pulmonary arteries to the segmental level. No evidence of pulmonary embolism. Normal cardiac size. No pericardial effusion. Nonaneurysmal aorta Mediastinum/Nodes: Midline trachea. No thyroid mass. Mildly enlarged bilateral axillary lymph nodes, on the right measuring up to 14 mm and on the left measuring up to 11 mm. Esophagus within normal limits. Lungs/Pleura: Small right-sided pleural effusion. Partial consolidations in the right greater than left lower lobes. Mild right middle lobe consolidation. Upper Abdomen: No acute finding Musculoskeletal: No acute osseous abnormality Review of the MIP images confirms the above findings. IMPRESSION: 1.  Negative for acute pulmonary embolus. 2. Small right-sided pleural effusion. Partial consolidations in the right greater than left lower lobes and right middle lobe, suspicious for pneumonia. 3. Mildly enlarged bilateral axillary lymph nodes, nonspecific, possibly reactive. Electronically Signed   By: Jasmine Pang M.D.   On: 05/23/2023 23:15    EKG: Independently reviewed.  Sinus tachycardia with rate 132; nonspecific ST changes with no evidence of acute ischemia   Labs on Admission: I have personally reviewed the available labs and imaging studies at the time of the admission.  Pertinent labs:   WBC; 12.4,  hgb: 10.4,  co2: 20,  troponin: 35>24,  Assessment and Plan: Principal Problem:   Sepsis due to pneumonia [facility]) Active Problems:   Elevated troponin   Acute respiratory failure with hypoxia (HCC)   Breast feeding status of mother   Microcytic anemia   Sjogren's syndrome (HCC)    Assessment and Plan: * Sepsis due to pneumonia  [facility]) 32 year old female presenting with 4 day history of worsening right sided back pain, shortness of breath and dyspnea on exertion found to have findings on CT chest suspicious for pneumonia with small right sided pleural effusion, partial consolidations in right greater than left lower lobes and right middle lobe with sepsis criteria  -admit to progressive -continue rocephin and start azithromycin for CAP coverage  -CTA negative for PE  -check PCT and RVP -BC obtained, lactic acid wnl  -she has no cough, but sputum cx order -urinary antigens pending  -received IVF in ED, encouraged oral hydration  -IS to bedside  -lidocaine patch and oral/IV pain meds for back pain  -BNP normal with no evidence of enlarged heart on imaging or other clinical findings for CHF -check inflammatory markers, but denies any chest pain.  -check echo to r/o peripartum CM, although low suspicion with normal BNP  -check TSH   Acute respiratory failure with hypoxia (HCC) Has desaturation to mid 80s with exertion and quickly rebounds with rest CTA ruled out PE Treating for pneumonia RVP/covid pending  Echo pending  Check vbg  Continue 2L oxygen [place]   Elevated troponin Troponin flat and down trending with no chest pain not indicative of ACS Checking inflammatory markers for myocarditis, but very low on differential with no chest pain   Breast feeding status of mother Discussed safety of medication and will ask pharmacy to verify as well She is pumping and dumping while in hospital, esp. With pain medication  Continue PNV   Microcytic anemia History of anemia and is 3 months post partum Sickle cell carrier Check iron studies Continue PNV    Advance Care Planning:   Code Status: Full Code   Consults: none   DVT Prophylaxis: lovenox   Family Communication: mom at bedside   Severity of Illness: The appropriate patient status for this patient is INPATIENT. Inpatient status is judged to be  reasonable and necessary in order to provide the required intensity of service to ensure the patient's safety. The patient's presenting symptoms, physical exam findings, and initial radiographic and laboratory data in the context of their chronic comorbidities is felt to place them at high risk for further clinical deterioration. Furthermore, it is not anticipated that the patient will be medically stable for discharge from the hospital within 2 midnights of admission.   * I certify that at the point of admission it is my clinical judgment that the patient will require inpatient hospital care spanning beyond 2 midnights from the point of admission  due to high intensity of service, high risk for further deterioration and high frequency of surveillance required.*  Author: Orland Mustard, MD 05/24/2023 10:22 AM  For on call review [email or web address].

## 2023-05-24 NOTE — Progress Notes (Signed)
Patient to [phone] at this time.  Upon arrival to unit patient in red mews with elevated heart rate and respiratory rate with very labored shallow respirations.  Rapid response nurse called to come evaluate patient.  MD contacted by charge nurse.

## 2023-05-24 NOTE — ED Notes (Signed)
The pt is drowsy from the pain med given her sats are lower now

## 2023-05-24 NOTE — ED Notes (Signed)
ED TO INPATIENT HANDOFF REPORT  ED Nurse Name and Phone #: chris c rn [phone]  S Name/Age/Gender [name] 32 y.o. female Room/Bed: 020C/020C  Code Status   Code Status: Prior  Home/SNF/Other Home Patient oriented to: self, place, time, and situation Is this baseline? Yes   Triage Complete: Triage complete  Chief Complaint Pneumonia [J18.9]  Triage Note Pt arrives via [facility]link from urgent care. Sent for tachycardia of 120-130 and RR of 40-45. Pain to rt mid back that radiates down to waist. Pt delivered 39 week baby 3 months ago.   Allergies Allergies  Allergen Reactions   Cephalexin     Other reaction(s): other    Level of Care/Admitting Diagnosis ED Disposition     ED Disposition  Admit   Condition  --   Comment  Hospital Area: [facility] [[id number]]  Level of Care: Telemetry Medical [104]  May admit patient to [facility] or [facility] if equivalent level of care is available:: Yes  Covid Evaluation: Asymptomatic - no recent exposure (last 10 days) testing not required  Diagnosis: Pneumonia [227785]  Admitting Physician: Briscoe Deutscher [[id number]]  Attending Physician: Briscoe Deutscher [[id number]]  Certification:: I certify this patient will need inpatient services for at least 2 midnights  Expected Medical Readiness: 05/26/2023          B Medical/Surgery History 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   Low vitamin D level    Postpartum hemorrhage 09/17/2018   Sjogren's syndrome (HCC)    Sjogren's syndrome with keratoconjunctivitis sicca (HCC) 02/27/2018   Torn ACL (anterior cruciate ligament) 2008   Trichomonas infection    UTI (urinary tract infection)    Vaginal Pap smear, abnormal    May  2018; biopsy was normal   Past Surgical History:  Procedure Laterality Date   ANTERIOR CRUCIATE LIGAMENT REPAIR  2008   CESAREAN SECTION N/A 04/25/2013   Procedure: CESAREAN SECTION;  Surgeon: Michael Litter, MD;  Location: [facility];  Service: Obstetrics;  Laterality: N/A;   COLPOSCOPY     endocervical curettage  2018     A IV Location/Drains/Wounds Patient Lines/Drains/Airways Status     Active Line/Drains/Airways     Name Placement date Placement time Site Days   Peripheral IV 05/23/23 20 G Right Antecubital 05/23/23  2015  Antecubital  1            Intake/Output Last 24 hours No intake or output data in the 24 hours ending 05/24/23 0024  Labs/Imaging Results for orders placed or performed during the hospital encounter of 05/23/23 (from the past 48 hour(s))  Troponin I (High Sensitivity)     Status: Abnormal   Collection Time: 05/23/23  8:24 PM  Result Value Ref Range   Troponin I (High Sensitivity) 35 (H) <18 ng/L    Comment: (NOTE) Elevated high sensitivity troponin I (hsTnI) values and significant  changes across serial measurements may suggest ACS but many other  chronic and acute conditions are known to elevate hsTnI results.  Refer to the "Links" section for chest pain algorithms and additional  guidance. Performed at [facility], [address], [place], [place] [address]   I-Stat CG4 Lactic Acid     Status: None   Collection Time: 05/23/23  8:38 PM  Result Value Ref Range   Lactic Acid, Venous 0.8 0.5 - 1.9 mmol/L  I-stat chem 8, ED (not at [facility], [facility] or [facility])     Status: Abnormal   Collection Time: 05/23/23  8:38 PM  Result Value Ref Range   Sodium 138 135 - 145 mmol/L   Potassium 5.4 (H) 3.5 - 5.1 mmol/L   Chloride 108 98 - 111 mmol/L   BUN 9 6 - 20 mg/dL   Creatinine, Ser 5.62 0.44 - 1.00 mg/dL   Glucose, Bld 97 70 - 99 mg/dL    Comment: Glucose reference range applies only to samples taken after fasting for at least 8 hours.   Calcium, Ion  1.02 (L) 1.15 - 1.40 mmol/L   TCO2 22 22 - 32 mmol/L   Hemoglobin 11.9 (L) 12.0 - 15.0 g/dL   HCT 13.0 (L) 86.5 - 78.4 %  I-Stat CG4 Lactic Acid     Status: None   Collection Time: 05/24/23 12:16 AM  Result Value Ref Range   Lactic Acid, Venous 0.8 0.5 - 1.9 mmol/L   CT Angio Chest PE W and/or Wo Contrast  Result Date: 05/23/2023 CLINICAL DATA:  Tachycardia EXAM: CT ANGIOGRAPHY CHEST WITH CONTRAST TECHNIQUE: Multidetector CT imaging of the chest was performed using the standard protocol during bolus administration of intravenous contrast. Multiplanar CT image reconstructions and MIPs were obtained to evaluate the vascular anatomy. RADIATION DOSE REDUCTION: This exam was performed according to the departmental dose-optimization program which includes automated exposure control, adjustment of the mA and/or kV according to patient size and/or use of iterative reconstruction technique. CONTRAST:  75mL OMNIPAQUE IOHEXOL 350 MG/ML SOLN COMPARISON:  None Available. FINDINGS: Cardiovascular: Satisfactory opacification of the pulmonary arteries to the segmental level. No evidence of pulmonary embolism. Normal cardiac size. No pericardial effusion. Nonaneurysmal aorta Mediastinum/Nodes: Midline trachea. No thyroid mass. Mildly enlarged bilateral axillary lymph nodes, on the right measuring up to 14 mm and on the left measuring up to 11 mm. Esophagus within normal limits. Lungs/Pleura: Small right-sided pleural effusion. Partial consolidations in the right greater than left lower lobes. Mild right middle lobe consolidation. Upper Abdomen: No acute finding Musculoskeletal: No acute osseous abnormality Review of the MIP images confirms the above findings. IMPRESSION: 1. Negative for acute pulmonary embolus. 2. Small right-sided pleural effusion. Partial consolidations in the right greater than left lower lobes and right middle lobe, suspicious for pneumonia. 3. Mildly enlarged bilateral axillary lymph nodes,  nonspecific, possibly reactive. Electronically Signed   By: Jasmine Pang M.D.   On: 05/23/2023 23:15    Pending Labs Unresulted Labs (From admission, onward)     Start     Ordered   05/24/23 0015  hCG, serum, qualitative  Once,   AD        05/24/23 0015   05/23/23 2323  Blood culture (routine x 2)  BLOOD CULTURE X 2,   R (with STAT occurrences)      05/23/23 2324   05/23/23 2323  Expectorated Sputum Assessment w Gram Stain, Rflx to Resp Cult  Once,   R        05/23/23 2324   05/23/23 2210  CBC with Differential/Platelet  Once,   R        05/23/23 2210   05/23/23 2155  Comprehensive metabolic panel  Once,   STAT  05/23/23 2155   05/23/23 2153  Brain natriuretic peptide  Once,   URGENT        05/23/23 2152   05/23/23 2031  CBC with Differential  Once,   STAT        05/23/23 2030            Vitals/Pain Today's Vitals   05/23/23 2215 05/23/23 2230 05/23/23 2345 05/24/23 0000  BP: (!) 129/91 (!) 132/91 (!) 137/93 (!) 146/117  Pulse: (!) 107 (!) 113 (!) 119 (!) 130  Resp: (!) 31 (!) 27 (!) 30 (!) 26  Temp:      TempSrc:      SpO2: 99% 99% 95% 94%  Weight:      Height:      PainSc:        Isolation Precautions No active isolations  Medications Medications  cefTRIAXone (ROCEPHIN) 2 g in sodium chloride 0.9 % 100 mL IVPB (2 g Intravenous New Bag/Given 05/24/23 0008)  acetaminophen (TYLENOL) tablet 650 mg (has no administration in time range)  oxyCODONE (Oxy IR/ROXICODONE) immediate release tablet 5 mg (has no administration in time range)  ondansetron (ZOFRAN) injection 4 mg (has no administration in time range)  HYDROmorphone (DILAUDID) injection 0.5 mg (has no administration in time range)  HYDROmorphone (DILAUDID) injection 0.5 mg (0.5 mg Intravenous Given 05/23/23 2330)  iohexol (OMNIPAQUE) 350 MG/ML injection 75 mL (75 mLs Intravenous Contrast Given 05/23/23 2255)    Mobility walks     Focused Assessments Cardiac Assessment Handoff:  Cardiac Rhythm:  Sinus tachycardia No results found for: "CKTOTAL", "CKMB", "CKMBINDEX", "TROPONINI" Lab Results  Component Value Date   DDIMER 0.42 07/26/2012   Does the Patient currently have chest pain? No    R Recommendations: See Admitting Provider Note  Report given to:   Additional Notes:

## 2023-05-24 NOTE — ED Notes (Signed)
Pt discussed with the staff on 6n  they will call me back if they can take her

## 2023-05-24 NOTE — ED Notes (Signed)
ED TO INPATIENT HANDOFF REPORT  ED Nurse Name and Phone #: Delice Bison, RN  S Name/Age/Gender Pamela Erickson 32 y.o. female Room/Bed: 046C/046C  Code Status   Code Status: Full Code  Home/SNF/Other Home Patient oriented to: self, place, time, and situation Is this baseline? Yes   Triage Complete: Triage complete  Chief Complaint Pneumonia [J18.9]  Triage Note Pt arrives via carelink from urgent care. Sent for tachycardia of 120-130 and RR of 40-45. Pain to rt mid back that radiates down to waist. Pt delivered 39 week baby 3 months ago.   Allergies Allergies  Allergen Reactions   Cephalexin     Patient was diagnosed with a disease process, and is not allergic to this medication.     Level of Care/Admitting Diagnosis ED Disposition     ED Disposition  Admit   Condition  --   Comment  Hospital Area: [facility] [[id number]]  Level of Care: Progressive [102]  Admit to Progressive based on following criteria: MULTISYSTEM THREATS such as stable sepsis, metabolic/electrolyte imbalance with or without encephalopathy that is responding to early treatment.  May admit patient to [facility] or [facility] if equivalent level of care is available:: Yes  Covid Evaluation: Asymptomatic - no recent exposure (last 10 days) testing not required  Diagnosis: Pneumonia [227785]  Admitting Physician: Briscoe Deutscher [[id number]]  Attending Physician: Briscoe Deutscher [[id number]]  Certification:: I certify this patient will need inpatient services for at least 2 midnights  Expected Medical Readiness: 05/26/2023          B Medical/Surgery History 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   Low vitamin D level    Postpartum hemorrhage  09/17/2018   Sjogren's syndrome (HCC)    Sjogren's syndrome with keratoconjunctivitis sicca (HCC) 02/27/2018   Torn ACL (anterior cruciate ligament) 2008   Trichomonas infection    UTI (urinary tract infection)    Vaginal Pap smear, abnormal    May 2018; biopsy was normal   Past Surgical History:  Procedure Laterality Date   ANTERIOR CRUCIATE LIGAMENT REPAIR  2008   CESAREAN SECTION N/A 04/25/2013   Procedure: CESAREAN SECTION;  Surgeon: Michael Litter, MD;  Location: [facility];  Service: Obstetrics;  Laterality: N/A;   COLPOSCOPY     endocervical curettage  2018     A IV Location/Drains/Wounds Patient Lines/Drains/Airways Status     Active Line/Drains/Airways     Name Placement date Placement time Site Days   Peripheral IV 05/24/23 22 G 1" Right;Anterior Forearm 05/24/23  0119  Forearm  less than 1            Intake/Output Last 24 hours No intake or output data in the 24 hours ending 05/24/23 1337  Labs/Imaging Results for orders placed or performed during the hospital encounter of 05/23/23 (from the past 48 hour(s))  Troponin I (High Sensitivity)     Status: Abnormal   Collection Time: 05/23/23  8:24 PM  Result Value Ref Range   Troponin I (High Sensitivity) 35 (H) <18 ng/L    Comment: (NOTE) Elevated high sensitivity troponin I (hsTnI) values and significant  changes across serial measurements may  suggest ACS but many other  chronic and acute conditions are known to elevate hsTnI results.  Refer to the "Links" section for chest pain algorithms and additional  guidance. Performed at [facility], [address] [place], [place] [address]   I-Stat CG4 Lactic Acid     Status: None   Collection Time: 05/23/23  8:38 PM  Result Value Ref Range   Lactic Acid, Venous 0.8 0.5 - 1.9 mmol/L  I-stat chem 8, ED (not at [facility], [facility] or [facility])     Status: Abnormal   Collection Time: 05/23/23  8:38 PM  Result Value Ref Range   Sodium 138 135 - 145 mmol/L   Potassium 5.4  (H) 3.5 - 5.1 mmol/L   Chloride 108 98 - 111 mmol/L   BUN 9 6 - 20 mg/dL   Creatinine, Ser 7.82 0.44 - 1.00 mg/dL   Glucose, Bld 97 70 - 99 mg/dL    Comment: Glucose reference range applies only to samples taken after fasting for at least 8 hours.   Calcium, Ion 1.02 (L) 1.15 - 1.40 mmol/L   TCO2 22 22 - 32 mmol/L   Hemoglobin 11.9 (L) 12.0 - 15.0 g/dL   HCT 95.6 (L) 21.3 - 08.6 %  Comprehensive metabolic panel     Status: Abnormal   Collection Time: 05/24/23 12:05 AM  Result Value Ref Range   Sodium 135 135 - 145 mmol/L   Potassium 3.7 3.5 - 5.1 mmol/L   Chloride 107 98 - 111 mmol/L   CO2 20 (L) 22 - 32 mmol/L   Glucose, Bld 96 70 - 99 mg/dL    Comment: Glucose reference range applies only to samples taken after fasting for at least 8 hours.   BUN 7 6 - 20 mg/dL   Creatinine, Ser 5.78 0.44 - 1.00 mg/dL   Calcium 8.6 (L) 8.9 - 10.3 mg/dL   Total Protein 8.2 (H) 6.5 - 8.1 g/dL   Albumin 3.1 (L) 3.5 - 5.0 g/dL   AST 24 15 - 41 U/L   ALT 17 0 - 44 U/L   Alkaline Phosphatase 93 38 - 126 U/L   Total Bilirubin 0.8 <1.2 mg/dL   GFR, Estimated >46 >96 mL/min    Comment: (NOTE) Calculated using the CKD-EPI Creatinine Equation (2021)    Anion gap 8 5 - 15    Comment: Performed at [facility], [address] [place], [place] [address]  Brain natriuretic peptide     Status: None   Collection Time: 05/24/23 12:05 AM  Result Value Ref Range   B Natriuretic Peptide 54.8 0.0 - 100.0 pg/mL    Comment: Performed at [facility], [address] [place], [place] [address]  CBC with Differential/Platelet     Status: Abnormal   Collection Time: 05/24/23 12:05 AM  Result Value Ref Range   WBC 12.9 (H) 4.0 - 10.5 K/uL   RBC 4.87 3.87 - 5.11 MIL/uL   Hemoglobin 10.4 (L) 12.0 - 15.0 g/dL   HCT 40.1 (L) 02.7 - 25.3 %   MCV 68.0 (L) 80.0 - 100.0 fL   MCH 21.4 (L) 26.0 - 34.0 pg   MCHC 31.4 30.0 - 36.0 g/dL   RDW 66.4 (H) 40.3 - 47.4 %   Platelets 428 (H) 150 - 400 K/uL     Comment: REPEATED TO VERIFY   nRBC 0.0 0.0 - 0.2 %   Neutrophils Relative % 79 %   Neutro Abs 10.1 (H) 1.7 -  7.7 K/uL   Lymphocytes Relative 7 %   Lymphs Abs 1.0 0.7 - 4.0 K/uL   Monocytes Relative 14 %   Monocytes Absolute 1.8 (H) 0.1 - 1.0 K/uL   Eosinophils Relative 0 %   Eosinophils Absolute 0.0 0.0 - 0.5 K/uL   Basophils Relative 0 %   Basophils Absolute 0.0 0.0 - 0.1 K/uL   Immature Granulocytes 0 %   Abs Immature Granulocytes 0.05 0.00 - 0.07 K/uL    Comment: Performed at [facility], [address], [place], [place] [address]  Troponin I (High Sensitivity)     Status: Abnormal   Collection Time: 05/24/23 12:05 AM  Result Value Ref Range   Troponin I (High Sensitivity) 24 (H) <18 ng/L    Comment: (NOTE) Elevated high sensitivity troponin I (hsTnI) values and significant  changes across serial measurements may suggest ACS but many other  chronic and acute conditions are known to elevate hsTnI results.  Refer to the "Links" section for chest pain algorithms and additional  guidance. Performed at [facility], [address], [place], [place] [address]   I-Stat CG4 Lactic Acid     Status: None   Collection Time: 05/24/23 12:16 AM  Result Value Ref Range   Lactic Acid, Venous 0.8 0.5 - 1.9 mmol/L  Respiratory (~20 pathogens) panel by PCR     Status: None   Collection Time: 05/24/23  8:04 AM   Specimen: Nasopharyngeal Swab; Respiratory  Result Value Ref Range   Adenovirus NOT DETECTED NOT DETECTED   Coronavirus 229E NOT DETECTED NOT DETECTED    Comment: (NOTE) The Coronavirus on the Respiratory Panel, DOES NOT test for the novel  Coronavirus (2019 nCoV)    Coronavirus HKU1 NOT DETECTED NOT DETECTED   Coronavirus NL63 NOT DETECTED NOT DETECTED   Coronavirus OC43 NOT DETECTED NOT DETECTED   Metapneumovirus NOT DETECTED NOT DETECTED   Rhinovirus / Enterovirus NOT DETECTED NOT DETECTED   Influenza A NOT DETECTED NOT DETECTED   Influenza B NOT DETECTED NOT  DETECTED   Parainfluenza Virus 1 NOT DETECTED NOT DETECTED   Parainfluenza Virus 2 NOT DETECTED NOT DETECTED   Parainfluenza Virus 3 NOT DETECTED NOT DETECTED   Parainfluenza Virus 4 NOT DETECTED NOT DETECTED   Respiratory Syncytial Virus NOT DETECTED NOT DETECTED   Bordetella pertussis NOT DETECTED NOT DETECTED   Bordetella Parapertussis NOT DETECTED NOT DETECTED   Chlamydophila pneumoniae NOT DETECTED NOT DETECTED   Mycoplasma pneumoniae NOT DETECTED NOT DETECTED    Comment: Performed at [facility], [address] [place], [place] [address]  hCG, serum, qualitative     Status: None   Collection Time: 05/24/23  8:59 AM  Result Value Ref Range   Preg, Serum NEGATIVE NEGATIVE    Comment:        THE SENSITIVITY OF THIS METHODOLOGY IS >10 mIU/mL. Performed at [facility], [address] [place], [place] [address]   Procalcitonin     Status: None   Collection Time: 05/24/23  8:59 AM  Result Value Ref Range   Procalcitonin <0.10 ng/mL    Comment:        Interpretation: PCT (Procalcitonin) <= 0.5 ng/mL: Systemic infection (sepsis) is not likely. Local bacterial infection is possible. (NOTE)       Sepsis PCT Algorithm           Lower Respiratory Tract  Infection PCT Algorithm    ----------------------------     ----------------------------         PCT < 0.25 ng/mL                PCT < 0.10 ng/mL          Strongly encourage             Strongly discourage   discontinuation of antibiotics    initiation of antibiotics    ----------------------------     -----------------------------       PCT 0.25 - 0.50 ng/mL            PCT 0.10 - 0.25 ng/mL               OR       >80% decrease in PCT            Discourage initiation of                                            antibiotics      Encourage discontinuation           of antibiotics    ----------------------------     -----------------------------         PCT >= 0.50 ng/mL               PCT 0.26 - 0.50 ng/mL               AND        <80% decrease in PCT             Encourage initiation of                                             antibiotics       Encourage continuation           of antibiotics    ----------------------------     -----------------------------        PCT >= 0.50 ng/mL                  PCT > 0.50 ng/mL               AND         increase in PCT                  Strongly encourage                                      initiation of antibiotics    Strongly encourage escalation           of antibiotics                                     -----------------------------                                           PCT <= 0.25 ng/mL  OR                                        > 80% decrease in PCT                                      Discontinue / Do not initiate                                             antibiotics  Performed at [facility], [address], [place], [place] [address]   Ferritin     Status: None   Collection Time: 05/24/23  8:59 AM  Result Value Ref Range   Ferritin 42 11 - 307 ng/mL    Comment: Performed at [facility], [address] [place], [place] [address]  Iron and TIBC     Status: Abnormal   Collection Time: 05/24/23  8:59 AM  Result Value Ref Range   Iron 12 (L) 28 - 170 ug/dL   TIBC 098 119 - 147 ug/dL   Saturation Ratios 3 (L) 10.4 - 31.8 %   UIBC 365 ug/dL    Comment: Performed at [facility], [address], [place], [place] [address]  Sedimentation rate     Status: Abnormal   Collection Time: 05/24/23  8:59 AM  Result Value Ref Range   Sed Rate 45 (H) 0 - 22 mm/hr    Comment: Performed at [facility], [address], [place], [place] [address]  C-reactive protein     Status: Abnormal   Collection Time: 05/24/23  8:59 AM  Result Value Ref Range   CRP 20.0 (H) <1.0 mg/dL    Comment: Performed at [facility], [address],  [place], [place] [address]  HIV Antibody (routine testing w rflx)     Status: None   Collection Time: 05/24/23  8:59 AM  Result Value Ref Range   HIV Screen 4th Generation wRfx Non Reactive Non Reactive    Comment: Performed at [facility], [address], [place], [place] [address]  TSH     Status: Abnormal   Collection Time: 05/24/23  8:59 AM  Result Value Ref Range   TSH 0.105 (L) 0.350 - 4.500 uIU/mL    Comment: Performed by a 3rd Generation assay with a functional sensitivity of <=0.01 uIU/mL. Performed at [facility], [address], [place], [place] [address]   SARS Coronavirus 2 by RT PCR (hospital order, performed in [facility] hospital lab) *cepheid single result test* Anterior Nasal Swab     Status: None   Collection Time: 05/24/23 10:22 AM   Specimen: Anterior Nasal Swab  Result Value Ref Range   SARS Coronavirus 2 by RT PCR NEGATIVE NEGATIVE    Comment: Performed at [facility], [address], [place], [place] [address]  T4, free     Status: None   Collection Time: 05/24/23 10:43 AM  Result Value Ref Range   Free T4 1.09 0.61 - 1.12 ng/dL    Comment: (NOTE) Biotin ingestion may interfere with free T4 tests. If the results are inconsistent with the TSH  level, previous test results, or the clinical presentation, then consider biotin interference. If needed, order repeat testing after stopping biotin. Performed at [facility], [address], [place], [place] [address]   I-Stat venous blood gas, ED     Status: Abnormal   Collection Time: 05/24/23 11:02 AM  Result Value Ref Range   pH, Ven 7.396 7.25 - 7.43   pCO2, Ven 34.7 (L) 44 - 60 mmHg   pO2, Ven 49 (H) 32 - 45 mmHg   Bicarbonate 21.3 20.0 - 28.0 mmol/L   TCO2 22 22 - 32 mmol/L   O2 Saturation 84 %   Acid-base deficit 3.0 (H) 0.0 - 2.0 mmol/L   Sodium 138 135 - 145 mmol/L   Potassium 3.5 3.5 - 5.1 mmol/L   Calcium, Ion 1.18 1.15 - 1.40 mmol/L   HCT 32.0 (L) 36.0 - 46.0 %   Hemoglobin  10.9 (L) 12.0 - 15.0 g/dL   Sample type VENOUS    CT Angio Chest PE W and/or Wo Contrast  Result Date: 05/23/2023 CLINICAL DATA:  Tachycardia EXAM: CT ANGIOGRAPHY CHEST WITH CONTRAST TECHNIQUE: Multidetector CT imaging of the chest was performed using the standard protocol during bolus administration of intravenous contrast. Multiplanar CT image reconstructions and MIPs were obtained to evaluate the vascular anatomy. RADIATION DOSE REDUCTION: This exam was performed according to the departmental dose-optimization program which includes automated exposure control, adjustment of the mA and/or kV according to patient size and/or use of iterative reconstruction technique. CONTRAST:  75mL OMNIPAQUE IOHEXOL 350 MG/ML SOLN COMPARISON:  None Available. FINDINGS: Cardiovascular: Satisfactory opacification of the pulmonary arteries to the segmental level. No evidence of pulmonary embolism. Normal cardiac size. No pericardial effusion. Nonaneurysmal aorta Mediastinum/Nodes: Midline trachea. No thyroid mass. Mildly enlarged bilateral axillary lymph nodes, on the right measuring up to 14 mm and on the left measuring up to 11 mm. Esophagus within normal limits. Lungs/Pleura: Small right-sided pleural effusion. Partial consolidations in the right greater than left lower lobes. Mild right middle lobe consolidation. Upper Abdomen: No acute finding Musculoskeletal: No acute osseous abnormality Review of the MIP images confirms the above findings. IMPRESSION: 1. Negative for acute pulmonary embolus. 2. Small right-sided pleural effusion. Partial consolidations in the right greater than left lower lobes and right middle lobe, suspicious for pneumonia. 3. Mildly enlarged bilateral axillary lymph nodes, nonspecific, possibly reactive. Electronically Signed   By: Jasmine Pang M.D.   On: 05/23/2023 23:15    Pending Labs Unresulted Labs (From admission, onward)     Start     Ordered   05/25/23 0500  Basic metabolic panel   Tomorrow morning,   R        05/24/23 0817   05/25/23 0500  CBC  Tomorrow morning,   R        05/24/23 0817   05/24/23 1250  Urinalysis, Routine w reflex microscopic -Urine, Clean Catch  Once,   R       Question:  Specimen Source  Answer:  Urine, Clean Catch   05/24/23 1249   05/24/23 1022  Blood gas, venous  Once,   R        05/24/23 1021   05/24/23 0815  Legionella Pneumophila Serogp 1 Ur Ag  (COPD / Pneumonia / Cellulitis / Lower Extremity Wound)  Once,   R        05/24/23 0817   05/24/23 0815  Strep pneumoniae urinary antigen  (COPD / Pneumonia / Cellulitis / Lower Extremity Wound)  Once,   R        05/24/23 0817   05/23/23 2323  Blood culture (routine x 2)  BLOOD CULTURE X 2,   R      05/23/23 2324   05/23/23 2323  Expectorated Sputum Assessment w Gram Stain, Rflx to Resp Cult  Once,   R        05/23/23 2324   05/23/23 2031  CBC with Differential  Once,   STAT        05/23/23 2030            Vitals/Pain Today's Vitals   05/24/23 1000 05/24/23 1049 05/24/23 1130 05/24/23 1230  BP: (!) 127/90  134/85 131/88  Pulse: (!) 122  (!) 110 (!) 118  Resp: 20  20 20   Temp:  98.7 F (37.1 C)    TempSrc:  Oral    SpO2: 94%  97% 100%  Weight:      Height:      PainSc:        Isolation Precautions Airborne and Contact precautions  Medications Medications  acetaminophen (TYLENOL) tablet 650 mg (650 mg Oral Given 05/24/23 0218)  oxyCODONE (Oxy IR/ROXICODONE) immediate release tablet 5 mg (5 mg Oral Given 05/24/23 0448)  ondansetron (ZOFRAN) injection 4 mg (has no administration in time range)  HYDROmorphone (DILAUDID) injection 0.5 mg (0.5 mg Intravenous Given 05/24/23 0218)  cefTRIAXone (ROCEPHIN) 2 g in sodium chloride 0.9 % 100 mL IVPB (has no administration in time range)  azithromycin (ZITHROMAX) 500 mg in dextrose 5 % 250 mL IVPB (0 mg Intravenous Stopped 05/24/23 1004)  enoxaparin (LOVENOX) injection 40 mg (40 mg Subcutaneous Given 05/24/23 1011)  lidocaine (LIDODERM) 5  % 1 patch (1 patch Transdermal Patch Applied 05/24/23 0826)  HYDROmorphone (DILAUDID) injection 0.5 mg (0.5 mg Intravenous Given 05/23/23 2330)  iohexol (OMNIPAQUE) 350 MG/ML injection 75 mL (75 mLs Intravenous Contrast Given 05/23/23 2255)  cefTRIAXone (ROCEPHIN) 2 g in sodium chloride 0.9 % 100 mL IVPB (0 g Intravenous Stopped 05/24/23 0051)  lactated ringers bolus 1,000 mL (0 mLs Intravenous Stopped 05/24/23 0827)  ketorolac (TORADOL) 30 MG/ML injection 30 mg (30 mg Intravenous Given 05/24/23 0823)    Mobility walks     Focused Assessments Pulmonary Assessment Handoff:  Lung sounds: L Breath Sounds: Clear R Breath Sounds: Clear O2 Device: Nasal Cannula O2 Flow Rate (L/min): 2 L/min    R Recommendations: See Admitting Provider Note  Report given to:   Additional Notes:

## 2023-05-24 NOTE — ED Notes (Signed)
6[facility] has rejected this pt

## 2023-05-24 NOTE — Assessment & Plan Note (Addendum)
Troponin flat and down trending with no chest pain not indicative of ACS Checking inflammatory markers for myocarditis, but very low on differential with no chest pain

## 2023-05-24 NOTE — Assessment & Plan Note (Signed)
Has desaturation to mid 80s with exertion and quickly rebounds with rest CTA ruled out PE Treating for pneumonia RVP/covid pending  Echo pending  Check vbg  Continue 2L oxygen [place]

## 2023-05-24 NOTE — Assessment & Plan Note (Addendum)
32 year old female presenting with 4 day history of worsening right sided back pain, shortness of breath and dyspnea on exertion found to have findings on CT chest suspicious for pneumonia with small right sided pleural effusion, partial consolidations in right greater than left lower lobes and right middle lobe with sepsis criteria  -admit to progressive -continue rocephin and start azithromycin for CAP coverage  -CTA negative for PE  -check PCT and RVP -BC obtained, lactic acid wnl  -she has no cough, but sputum cx order -urinary antigens pending  -received IVF in ED, encouraged oral hydration  -IS to bedside  -lidocaine patch and oral/IV pain meds for back pain  -BNP normal with no evidence of enlarged heart on imaging or other clinical findings for CHF -check inflammatory markers, but denies any chest pain.  -check echo to r/o peripartum CM, although low suspicion with normal BNP  -check TSH

## 2023-05-24 NOTE — Assessment & Plan Note (Signed)
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

## 2023-05-24 NOTE — ED Notes (Signed)
Pt put on 2L NC for comfort.

## 2023-05-24 NOTE — Assessment & Plan Note (Signed)
Discussed safety of medication and will ask pharmacy to verify as well She is pumping and dumping while in hospital, esp. With pain medication  Continue PNV

## 2023-05-24 NOTE — Progress Notes (Signed)
  Echocardiogram 2D Echocardiogram has been performed.  Delcie Roch 05/24/2023, 4:02 PM

## 2023-05-24 NOTE — ED Notes (Signed)
[facility] has not decided about this pt yet

## 2023-05-25 DIAGNOSIS — A419 Sepsis, unspecified organism: Secondary | ICD-10-CM | POA: Diagnosis not present

## 2023-05-25 DIAGNOSIS — J189 Pneumonia, unspecified organism: Secondary | ICD-10-CM | POA: Diagnosis not present

## 2023-05-25 LAB — BASIC METABOLIC PANEL
Anion gap: 8 (ref 5–15)
BUN: 7 mg/dL (ref 6–20)
CO2: 19 mmol/L — ABNORMAL LOW (ref 22–32)
Calcium: 8.1 mg/dL — ABNORMAL LOW (ref 8.9–10.3)
Chloride: 105 mmol/L (ref 98–111)
Creatinine, Ser: 0.99 mg/dL (ref 0.44–1.00)
GFR, Estimated: >60 mL/min (ref >60)
Glucose, Bld: 123 mg/dL — ABNORMAL HIGH (ref 70–99)
Potassium: 3.2 mmol/L — ABNORMAL LOW (ref 3.5–5.1)
Sodium: 132 mmol/L — ABNORMAL LOW (ref 135–145)

## 2023-05-25 LAB — CBC
HCT: 30.5 % — ABNORMAL LOW (ref 36.0–46.0)
Hemoglobin: 9.8 g/dL — ABNORMAL LOW (ref 12.0–15.0)
MCH: 21.5 pg — ABNORMAL LOW (ref 26.0–34.0)
MCHC: 32.1 g/dL (ref 30.0–36.0)
MCV: 66.9 fL — ABNORMAL LOW (ref 80.0–100.0)
Platelets: 475 10*3/uL — ABNORMAL HIGH (ref 150–400)
RBC: 4.56 MIL/uL (ref 3.87–5.11)
RDW: 18.6 % — ABNORMAL HIGH (ref 11.5–15.5)
WBC: 12.2 10*3/uL — ABNORMAL HIGH (ref 4.0–10.5)
nRBC: 0 % (ref 0.0–0.2)

## 2023-05-25 LAB — STREP PNEUMONIAE URINARY ANTIGEN: Strep Pneumo Urinary Antigen: NEGATIVE

## 2023-05-25 MED ORDER — POTASSIUM CHLORIDE CRYS ER 20 MEQ PO TBCR
40.0000 meq | EXTENDED_RELEASE_TABLET | Freq: Four times a day (QID) | ORAL | Status: AC
  Administered 2023-05-25 (×2): 40 meq via ORAL
  Filled 2023-05-25: qty 2

## 2023-05-25 MED ORDER — IBUPROFEN 400 MG PO TABS
400.0000 mg | ORAL_TABLET | Freq: Four times a day (QID) | ORAL | Status: DC | PRN
  Administered 2023-05-26: 400 mg via ORAL
  Filled 2023-05-25 (×2): qty 1

## 2023-05-25 MED ORDER — PANTOPRAZOLE SODIUM 40 MG PO TBEC
40.0000 mg | DELAYED_RELEASE_TABLET | Freq: Every day | ORAL | Status: DC
  Administered 2023-05-25 – 2023-05-29 (×5): 40 mg via ORAL
  Filled 2023-05-25 (×5): qty 1

## 2023-05-25 MED ORDER — IBUPROFEN 400 MG PO TABS
400.0000 mg | ORAL_TABLET | Freq: Once | ORAL | Status: AC
  Administered 2023-05-25: 400 mg via ORAL
  Filled 2023-05-25: qty 1

## 2023-05-25 MED ORDER — METOPROLOL TARTRATE 5 MG/5ML IV SOLN
2.5000 mg | INTRAVENOUS | Status: DC | PRN
  Administered 2023-05-25 – 2023-05-26 (×2): 2.5 mg via INTRAVENOUS
  Filled 2023-05-25 (×2): qty 5

## 2023-05-25 MED ORDER — LACTATED RINGERS IV SOLN: INTRAVENOUS | Status: AC

## 2023-05-25 MED ORDER — HYDRALAZINE HCL 20 MG/ML IJ SOLN: 5.0000 mg | Freq: Four times a day (QID) | INTRAMUSCULAR | Status: DC | PRN

## 2023-05-25 NOTE — Plan of Care (Signed)

## 2023-05-25 NOTE — Plan of Care (Signed)

## 2023-05-25 NOTE — Progress Notes (Signed)
   05/25/23 0000  Provider Notification  Provider Name/Title Dow Adolph, MD  Date Provider Notified 05/25/23  Time Provider Notified 0030  Method of Notification Page  Notification Reason Critical Result  Test performed and critical result T-103F  Date Critical Result Received 05/25/23  Time Critical Result Received 0025  Provider response Other (Comment) (Give tylenol)  Date of Provider Response 05/25/23  Time of Provider Response [phone]

## 2023-05-25 NOTE — Progress Notes (Addendum)
PROGRESS NOTE    Pamela Erickson  VWU:[id number] DOB: 29-Jul-1990 DOA: 05/23/2023 PCP: Arnette Felts, FNP  Chief Complaint  Patient presents with   Tachycardia    Brief Narrative:     Pamela Erickson is a 32 y.o. 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    On Wednesday morning she woke up and her back hurt and went to urgent care and they told her she had pulled a muscle and gave her a muscle relaxer. On Thursday her pain was worse and she was having shortness of breath. She also had some chills as well. She went back to urgent care yesterday and they sent her to ED due to tachycardia and shortness of breath. She has dyspnea on exertion as well. She has no chest pain and denies palpitations.    She does work at a day care, but no known sick contacts. They have a common cold. Her 32 year old has a cough at night as well. She denies any upper respiratory symptoms including congestion, sore throat or ear pain. She has had no cough.    She does not smoke or drink alcohol.    ER Course:  vitals :afebrile, bp: 128/86, HR: 117, RR: 32, oxygen: 100%RA Pertinent labs: WBC; 12.4, hgb: 10.4, co2: 20, troponin: 35>24,  CTA chest: negative for PE. Small right sided pleural effusion. Partial consolidations in the right greater than left lower lobes and right middle lobe, suspicious for pneumonia. Mildly enlarged bilateral axillary lymph nodes, nonspecific, possibly reactive.  In ED: BC obtained. Given rocephin, 1L IVF bolus and TRH asked to admit.     Assessment & Plan:   Principal Problem:   Sepsis due to pneumonia [facility]) Active Problems:   Elevated troponin   Acute respiratory failure with hypoxia (HCC)   Breast feeding status of mother   Microcytic anemia   Sjogren's syndrome  (HCC)   Sepsis due to pneumonia  -Patient presents with fever, leukocytosis, respiratory distress, tachypnea. -CTA chest negative for PE but significant for pneumonia. -Significantly tachypneic this morning with diminished air entry and shallow breathing, I have discussed with her at length about the importance of using incentive spirometry, flutter valve. -resp panel is negative -COVID-19 is negative -Continue with IV azithromycin and Rocephin -Follow blood cultures, sputum cultures and Legionella antigen   Acute respiratory failure with hypoxia (HCC) Has desaturation to mid 80s with exertion and quickly rebounds with rest, she is with significant dyspnea breathing at 30 breaths/min CTA ruled out PE Treating for pneumonia Encouraged use incentive spirometry, flutter valve  Elevated troponin -Non-ACS pattern, secondary to sepsis and respiratory failure, 2D echo with a preserved EF and no regional wall motion abnormalities  Breast feeding status of mother Discussed safety of medication and will ask pharmacy to verify as well She is pumping and dumping while in hospital, esp. With pain medication  Continue PNV    Microcytic anemia History of anemia and is 3 months post partum Sickle cell carrier Check iron studies Continue PNV   Sjogren's disease -Patient is following with rheumatology at [facility], will discuss with her need to establish with pulmonary as an outpatient  DVT prophylaxis: Lovenox Code Status: Full Family Communication: Mother at bedside Disposition:   Status is: Inpatient    Consultants:  None  Subjective:  Patient remains febrile overnight, tachypneic, Tmax 103 at midnight, reports dyspnea, cough, pleuritic chest pain and right lower back  Objective: Vitals:   05/25/23 0000 05/25/23 0100 05/25/23 0300 05/25/23 0400  BP: (!) 143/95   115/77  Pulse: (!) 129 (!) 134 (!) 125 (!) 116  Resp: 18 16 20 20   Temp: (!) 103 F (39.4 C) (!) 103 F (39.4 C) (!)  102.4 F (39.1 C) 99.5 F (37.5 C)  TempSrc: Oral   Oral  SpO2: 94% 96% 92% 94%  Weight:      Height:        Intake/Output Summary (Last 24 hours) at 05/25/2023 1359 Last data filed at 05/25/2023 0746 Gross per 24 hour  Intake 1318.91 ml  Output --  Net 1318.91 ml   Filed Weights   05/23/23 2010  Weight: 103.4 kg    Examination:  Awake Alert, Oriented X 3, tachypneic Symmetrical Chest wall movement, air entry bilaterally due to poor inspiratory effort from her pleuritic chest pain, significant rales and rhonchi in mid right lung Tachycardic,No Gallops,Rubs or new Murmurs, No Parasternal Heave +ve B.Sounds, Abd Soft, No tenderness, No rebound - guarding or rigidity. No Cyanosis, Clubbing or edema, No new Rash or bruise      Data Reviewed: I have personally reviewed following labs and imaging studies  CBC: Recent Labs  Lab 05/23/23 2038 05/24/23 0005 05/24/23 1102 05/25/23 0232  WBC  --  12.9*  --  12.2*  NEUTROABS  --  10.1*  --   --   HGB 11.9* 10.4* 10.9* 9.8*  HCT 35.0* 33.1* 32.0* 30.5*  MCV  --  68.0*  --  66.9*  PLT  --  428*  --  475*    Basic Metabolic Panel: Recent Labs  Lab 05/23/23 2038 05/24/23 0005 05/24/23 1102 05/25/23 0232  NA 138 135 138 132*  K 5.4* 3.7 3.5 3.2*  CL 108 107  --  105  CO2  --  20*  --  19*  GLUCOSE 97 96  --  123*  BUN 9 7  --  7  CREATININE 0.80 0.83  --  0.99  CALCIUM  --  8.6*  --  8.1*    GFR: Estimated Creatinine Clearance: 92 mL/min (by C-G formula based on SCr of 0.99 mg/dL).  Liver Function Tests: Recent Labs  Lab 05/24/23 0005  AST 24  ALT 17  ALKPHOS 93  BILITOT 0.8  PROT 8.2*  ALBUMIN 3.1*    CBG: No results for input(s): "GLUCAP" in the last 168 hours.   Recent Results (from the past 240 hour(s))  Blood culture (routine x 2)     Status: None (Preliminary result)   Collection Time: 05/23/23  8:20 PM   Specimen: BLOOD  Result Value Ref Range Status   Specimen Description BLOOD SITE NOT  SPECIFIED  Final   Special Requests   Final    BOTTLES DRAWN AEROBIC AND ANAEROBIC Blood Culture adequate volume   Culture   Final    NO GROWTH 1 DAY Performed at [facility], [address], [place], [place] [address]    Report Status PENDING  Incomplete  Blood culture (routine x 2)     Status: None (Preliminary result)   Collection Time: 05/23/23  8:24 PM   Specimen: BLOOD  Result Value Ref Range Status   Specimen Description BLOOD SITE NOT SPECIFIED  Final   Special Requests   Final    BOTTLES DRAWN AEROBIC AND ANAEROBIC Blood  Culture adequate volume   Culture   Final    NO GROWTH 1 DAY Performed at [facility], [address], [place], [place] [address]    Report Status PENDING  Incomplete  Respiratory (~20 pathogens) panel by PCR     Status: None   Collection Time: 05/24/23  8:04 AM   Specimen: Nasopharyngeal Swab; Respiratory  Result Value Ref Range Status   Adenovirus NOT DETECTED NOT DETECTED Final   Coronavirus 229E NOT DETECTED NOT DETECTED Final    Comment: (NOTE) The Coronavirus on the Respiratory Panel, DOES NOT test for the novel  Coronavirus (2019 nCoV)    Coronavirus HKU1 NOT DETECTED NOT DETECTED Final   Coronavirus NL63 NOT DETECTED NOT DETECTED Final   Coronavirus OC43 NOT DETECTED NOT DETECTED Final   Metapneumovirus NOT DETECTED NOT DETECTED Final   Rhinovirus / Enterovirus NOT DETECTED NOT DETECTED Final   Influenza A NOT DETECTED NOT DETECTED Final   Influenza B NOT DETECTED NOT DETECTED Final   Parainfluenza Virus 1 NOT DETECTED NOT DETECTED Final   Parainfluenza Virus 2 NOT DETECTED NOT DETECTED Final   Parainfluenza Virus 3 NOT DETECTED NOT DETECTED Final   Parainfluenza Virus 4 NOT DETECTED NOT DETECTED Final   Respiratory Syncytial Virus NOT DETECTED NOT DETECTED Final   Bordetella pertussis NOT DETECTED NOT DETECTED Final   Bordetella Parapertussis NOT DETECTED NOT DETECTED Final   Chlamydophila pneumoniae NOT DETECTED NOT  DETECTED Final   Mycoplasma pneumoniae NOT DETECTED NOT DETECTED Final    Comment: Performed at [facility], [address], [place], [place] [address]  SARS Coronavirus 2 by RT PCR (hospital order, performed in [facility] hospital lab) *cepheid single result test* Anterior Nasal Swab     Status: None   Collection Time: 05/24/23 10:22 AM   Specimen: Anterior Nasal Swab  Result Value Ref Range Status   SARS Coronavirus 2 by RT PCR NEGATIVE NEGATIVE Final    Comment: Performed at [facility], [address], [place], [place] [address]         Radiology Studies: ECHOCARDIOGRAM COMPLETE  Result Date: 05/24/2023    ECHOCARDIOGRAM REPORT   Patient Name:   LEELLEN CHAVERO Date of Exam: 05/24/2023 Medical Rec #:  [id number]       Height:       62.0 in Accession #:    [id number]      Weight:       228.0 lb Date of Birth:  07-25-1990       BSA:          2.021 m Patient Age:    32 years        BP:           150/94 mmHg Patient Gender: F               HR:           135 bpm. Exam Location:  Inpatient Procedure: 2D Echo, Cardiac Doppler and Color Doppler STAT ECHO Indications:    dyspnea.  History:        Patient has no prior history of Echocardiogram examinations.                 Arrythmias:Tachycardia; Signs/Symptoms:post partum.  Sonographer:    Delcie Roch RDCS Referring Phys: [id number] ALLISON WOLFE IMPRESSIONS  1. Left ventricular ejection fraction, by estimation, is 65 to 70%. The left ventricle has normal function. The left ventricle has no regional wall motion  abnormalities. There is mild concentric left ventricular hypertrophy. Indeterminate diastolic filling due to E-A fusion.  2. Right ventricular systolic function is hyperdynamic. The right ventricular size is normal. There is normal pulmonary artery systolic pressure.  3. There is no evidence of cardiac tamponade.  4. The mitral valve is normal in structure. No evidence of mitral valve regurgitation. No evidence of mitral  stenosis.  5. Tricuspid valve regurgitation is mild to moderate.  6. The aortic valve is tricuspid. Aortic valve regurgitation is not visualized.  7. Cannot exclude a small PFO. Comparison(s): No prior Echocardiogram. FINDINGS  Left Ventricle: Left ventricular ejection fraction, by estimation, is 65 to 70%. The left ventricle has normal function. The left ventricle has no regional wall motion abnormalities. The left ventricular internal cavity size was normal in size. There is  mild concentric left ventricular hypertrophy. Indeterminate diastolic filling due to E-A fusion. Right Ventricle: The right ventricular size is normal. No increase in right ventricular wall thickness. Right ventricular systolic function is hyperdynamic. There is normal pulmonary artery systolic pressure. The tricuspid regurgitant velocity is 2.72 m/s, and with an assumed right atrial pressure of 3 mmHg, the estimated right ventricular systolic pressure is 32.6 mmHg. Left [facility]: Left atrial size was normal in size. Right [facility]: Right atrial size was normal in size. Pericardium: Trivial pericardial effusion is present. The pericardial effusion is surrounding the apex. There is no evidence of cardiac tamponade. Mitral Valve: The mitral valve is normal in structure. No evidence of mitral valve regurgitation. No evidence of mitral valve stenosis. Tricuspid Valve: The tricuspid valve is normal in structure. Tricuspid valve regurgitation is mild to moderate. No evidence of tricuspid stenosis. Aortic Valve: The aortic valve is tricuspid. Aortic valve regurgitation is not visualized. Pulmonic Valve: The pulmonic valve was normal in structure. Pulmonic valve regurgitation is not visualized. No evidence of pulmonic stenosis. Aorta: The aortic root, ascending aorta and aortic arch are all structurally normal, with no evidence of dilitation or obstruction. IAS/Shunts: Cannot exclude a small PFO.  LEFT VENTRICLE PLAX 2D LVIDd:         4.50 cm    Diastology LVIDs:         3.00 cm   LV e' lateral: 12.00 cm/s LV PW:         1.10 cm LV IVS:        1.10 cm LVOT diam:     2.20 cm LV SV:         58 LV SV Index:   29 LVOT Area:     3.80 cm  RIGHT VENTRICLE             IVC RV Basal diam:  2.60 cm     IVC diam: 1.60 cm RV S prime:     19.60 cm/s TAPSE (M-mode): 2.0 cm LEFT ATRIUM             Index        RIGHT ATRIUM           Index LA diam:        3.60 cm 1.78 cm/m   RA Area:     11.50 cm LA Vol (A2C):   61.0 ml 30.18 ml/m  RA Volume:   25.70 ml  12.72 ml/m LA Vol (A4C):   51.2 ml 25.33 ml/m LA Biplane Vol: 59.2 ml 29.29 ml/m  AORTIC VALVE LVOT Vmax:   109.00 cm/s LVOT Vmean:  73.400 cm/s LVOT VTI:    0.153 m  AORTA Ao  Root diam: 2.80 cm Ao Asc diam:  2.80 cm TRICUSPID VALVE TR Peak grad:   29.6 mmHg TR Vmax:        272.00 cm/s  SHUNTS Systemic VTI:  0.15 m Systemic Diam: 2.20 cm Riley Lam MD Electronically signed by Riley Lam MD Signature Date/Time: 05/24/2023/4:08:21 PM    Final    CT Angio Chest PE W and/or Wo Contrast  Result Date: 05/23/2023 CLINICAL DATA:  Tachycardia EXAM: CT ANGIOGRAPHY CHEST WITH CONTRAST TECHNIQUE: Multidetector CT imaging of the chest was performed using the standard protocol during bolus administration of intravenous contrast. Multiplanar CT image reconstructions and MIPs were obtained to evaluate the vascular anatomy. RADIATION DOSE REDUCTION: This exam was performed according to the departmental dose-optimization program which includes automated exposure control, adjustment of the mA and/or kV according to patient size and/or use of iterative reconstruction technique. CONTRAST:  75mL OMNIPAQUE IOHEXOL 350 MG/ML SOLN COMPARISON:  None Available. FINDINGS: Cardiovascular: Satisfactory opacification of the pulmonary arteries to the segmental level. No evidence of pulmonary embolism. Normal cardiac size. No pericardial effusion. Nonaneurysmal aorta Mediastinum/Nodes: Midline trachea. No thyroid mass.  Mildly enlarged bilateral axillary lymph nodes, on the right measuring up to 14 mm and on the left measuring up to 11 mm. Esophagus within normal limits. Lungs/Pleura: Small right-sided pleural effusion. Partial consolidations in the right greater than left lower lobes. Mild right middle lobe consolidation. Upper Abdomen: No acute finding Musculoskeletal: No acute osseous abnormality Review of the MIP images confirms the above findings. IMPRESSION: 1. Negative for acute pulmonary embolus. 2. Small right-sided pleural effusion. Partial consolidations in the right greater than left lower lobes and right middle lobe, suspicious for pneumonia. 3. Mildly enlarged bilateral axillary lymph nodes, nonspecific, possibly reactive. Electronically Signed   By: Jasmine Pang M.D.   On: 05/23/2023 23:15        Scheduled Meds:  enoxaparin (LOVENOX) injection  40 mg Subcutaneous Daily   lidocaine  1 patch Transdermal Daily   prenatal vitamin w/FE, FA  1 tablet Oral Daily   Continuous Infusions:  azithromycin 500 mg (05/25/23 0758)   cefTRIAXone (ROCEPHIN)  IV 2 g (05/24/23 2054)     LOS: 2 days     Huey Bienenstock, MD [facility]   To contact the attending provider between 7A-7P or the covering provider during after hours 7P-7A, please log into the web site [email or web address] and access using universal [place] password for that web site. If you do not have the password, please call the hospital operator.  05/25/2023, 1:59 PM

## 2023-05-26 DIAGNOSIS — A419 Sepsis, unspecified organism: Secondary | ICD-10-CM | POA: Diagnosis not present

## 2023-05-26 DIAGNOSIS — J189 Pneumonia, unspecified organism: Secondary | ICD-10-CM | POA: Diagnosis not present

## 2023-05-26 LAB — BASIC METABOLIC PANEL
Anion gap: 8 (ref 5–15)
BUN: 7 mg/dL (ref 6–20)
CO2: 22 mmol/L (ref 22–32)
Calcium: 8.8 mg/dL — ABNORMAL LOW (ref 8.9–10.3)
Chloride: 105 mmol/L (ref 98–111)
Creatinine, Ser: 0.83 mg/dL (ref 0.44–1.00)
GFR, Estimated: >60 mL/min (ref >60)
Glucose, Bld: 104 mg/dL — ABNORMAL HIGH (ref 70–99)
Potassium: 3.8 mmol/L (ref 3.5–5.1)
Sodium: 135 mmol/L (ref 135–145)

## 2023-05-26 LAB — LIPID PANEL
Cholesterol: 138 mg/dL (ref 0–200)
HDL: 41 mg/dL (ref >40)
LDL Cholesterol: 83 mg/dL (ref 0–99)
Total CHOL/HDL Ratio: 3.4 {ratio}
Triglycerides: 68 mg/dL (ref <150)
VLDL: 14 mg/dL (ref 0–40)

## 2023-05-26 LAB — BRAIN NATRIURETIC PEPTIDE: B Natriuretic Peptide: 43.7 pg/mL (ref 0.0–100.0)

## 2023-05-26 LAB — C-REACTIVE PROTEIN: CRP: 21.3 mg/dL — ABNORMAL HIGH (ref <1.0)

## 2023-05-26 LAB — CBC
HCT: 33.4 % — ABNORMAL LOW (ref 36.0–46.0)
Hemoglobin: 10.4 g/dL — ABNORMAL LOW (ref 12.0–15.0)
MCH: 21.2 pg — ABNORMAL LOW (ref 26.0–34.0)
MCHC: 31.1 g/dL (ref 30.0–36.0)
MCV: 68.2 fL — ABNORMAL LOW (ref 80.0–100.0)
Platelets: 502 10*3/uL — ABNORMAL HIGH (ref 150–400)
RBC: 4.9 MIL/uL (ref 3.87–5.11)
RDW: 18.8 % — ABNORMAL HIGH (ref 11.5–15.5)
WBC: 10.4 10*3/uL (ref 4.0–10.5)
nRBC: 0 % (ref 0.0–0.2)

## 2023-05-26 LAB — TSH: TSH: 0.112 u[IU]/mL — ABNORMAL LOW (ref 0.350–4.500)

## 2023-05-26 LAB — PROCALCITONIN: Procalcitonin: <0.1 ng/mL

## 2023-05-26 LAB — T4, FREE: Free T4: 1.03 ng/dL (ref 0.61–1.12)

## 2023-05-26 LAB — FERRITIN: Ferritin: 93 ng/mL (ref 11–307)

## 2023-05-26 MED ORDER — AZITHROMYCIN 500 MG PO TABS
500.0000 mg | ORAL_TABLET | Freq: Every day | ORAL | Status: AC
  Administered 2023-05-27 – 2023-05-28 (×2): 500 mg via ORAL
  Filled 2023-05-26 (×2): qty 1

## 2023-05-26 NOTE — Progress Notes (Signed)
Nurse requested Mobility Specialist to perform oxygen saturation test with pt which includes removing pt from oxygen both at rest and while ambulating.  Below are the results from that testing.     Patient Saturations on Room Air at Rest = spO2 92%  Patient Saturations on Room Air while Ambulating = sp02 88% .  Rested and performed pursed lip breathing for 1 minute with sp02 at 85%.  Patient Saturations on 0.5 Liters of oxygen while Ambulating = sp02 93%  At end of testing pt left in room on 0  Liters of oxygen.  Reported results to nurse.

## 2023-05-26 NOTE — Progress Notes (Signed)
   05/26/23 1547  TOC Brief Assessment  Insurance and Status Reviewed ([employer])  Patient has primary care physician Yes  Home environment has been reviewed from Home  Prior level of function: Independent  Employed at daycare  Prior/Current Home Services No current home services  Social Determinants of Health Reivew SDOH reviewed no interventions necessary  Readmission risk has been reviewed Yes (13%)  Transition of care needs no transition of care needs at this time   [facility] will continue to follow patient for any additional discharge needs

## 2023-05-26 NOTE — Progress Notes (Signed)
PROGRESS NOTE    Pamela Erickson  FAO:[id number] DOB: 04-15-1991 DOA: 05/23/2023 PCP: Arnette Felts, FNP  Chief Complaint  Patient presents with   Tachycardia    Brief Narrative:     Pamela Erickson is a 32 y.o. 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    On Wednesday morning she woke up and her back hurt and went to urgent care and they told her she had pulled a muscle and gave her a muscle relaxer. On Thursday her pain was worse and she was having shortness of breath. She also had some chills as well. She went back to urgent care yesterday and they sent her to ED due to tachycardia and shortness of breath. She has dyspnea on exertion as well. She has no chest pain and denies palpitations.    She does work at a day care, but no known sick contacts. They have a common cold. Her 32 year old has a cough at night as well. She denies any upper respiratory symptoms including congestion, sore throat or ear pain. She has had no cough.    She does not smoke or drink alcohol.    ER Course:  vitals :afebrile, bp: 128/86, HR: 117, RR: 32, oxygen: 100%RA Pertinent labs: WBC; 12.4, hgb: 10.4, co2: 20, troponin: 35>24,  CTA chest: negative for PE. Small right sided pleural effusion. Partial consolidations in the right greater than left lower lobes and right middle lobe, suspicious for pneumonia. Mildly enlarged bilateral axillary lymph nodes, nonspecific, possibly reactive.  In ED: BC obtained. Given rocephin, 1L IVF bolus and TRH asked to admit.     Assessment & Plan:   Principal Problem:   Sepsis due to pneumonia [facility]) Active Problems:   Elevated troponin   Acute respiratory failure with hypoxia (HCC)   Breast feeding status of mother   Microcytic anemia   Sjogren's syndrome  (HCC)   Sepsis due to pneumonia  -Patient presents with fever, leukocytosis, respiratory distress, tachypnea. -CTA chest negative for PE but significant for pneumonia. -Significantly tachypneic this morning with diminished air entry and shallow breathing, I have discussed with her at length about the importance of using incentive spirometry, flutter valve. -resp panel is negative -COVID-19 is negative -Continue with IV azithromycin and Rocephin -Blood cultures remain negative -Follow on Legionella -she remains with significant fever intermittently, but leukocytosis trending down, tachycardia has improved as well Acute respiratory failure with hypoxia (HCC) Has desaturation to mid 80s with exertion and quickly rebounds with rest, she is with significant dyspnea breathing at 30 breaths/min CTA ruled out PE Treating for pneumonia Encouraged use incentive spirometry, flutter valve  Elevated troponin -Non-ACS pattern, secondary to sepsis and respiratory failure, 2D echo with a preserved EF and no regional wall motion abnormalities  Breast feeding status of mother Discussed safety of medication and will ask pharmacy to verify as well She is pumping and dumping while in hospital, esp. With pain medication  Continue PNV    Microcytic anemia History of anemia and is 3 months post partum Sickle cell carrier Check iron studies Continue PNV   Sjogren's disease -Patient is following with rheumatology at [facility], will discuss with her need to establish with pulmonary as an outpatient, I have discussed that with her  Abnormal TSH - Her TSH level is  low, but elevated free T4 within normal limit (even though on the higher side) but she denies any symptoms of hyperthyroidism, so I will hold on any treatment for now and recommend repeat labs and endocrinology follow-up as an outpatient, have discussed this plan with her and she is agreeable.  DVT prophylaxis: Lovenox Code Status: Full Family  Communication: Mother at bedside Disposition:   Status is: Inpatient    Consultants:  None  Subjective: Patient remains febrile overnight, tachypneic, Tmax 103 at midnight, reports dyspnea, cough, pleuritic chest pain and right lower back  Objective: Vitals:   05/26/23 0857 05/26/23 0858 05/26/23 1000 05/26/23 1047  BP:  (!) 127/90 (!) 136/91   Pulse: (!) 127 (!) 109 (!) 106   Resp: (!) 33 20 (!) 22   Temp:    98.9 F (37.2 C)  TempSrc:    Oral  SpO2: 96% 96% 95%   Weight:      Height:       No intake or output data in the 24 hours ending 05/26/23 1413  Filed Weights   05/23/23 2010  Weight: 103.4 kg    Examination:  Awake Alert, Oriented X 3, hears better today Symmetrical Chest wall movement, significant Rales in the right lung RRR,No Gallops,Rubs or new Murmurs, No Parasternal Heave +ve B.Sounds, Abd Soft, No tenderness, No rebound - guarding or rigidity. No Cyanosis, Clubbing or edema, No new Rash or bruise      Data Reviewed: I have personally reviewed following labs and imaging studies  CBC: Recent Labs  Lab 05/23/23 2038 05/24/23 0005 05/24/23 1102 05/25/23 0232 05/26/23 0335  WBC  --  12.9*  --  12.2* 10.4  NEUTROABS  --  10.1*  --   --   --   HGB 11.9* 10.4* 10.9* 9.8* 10.4*  HCT 35.0* 33.1* 32.0* 30.5* 33.4*  MCV  --  68.0*  --  66.9* 68.2*  PLT  --  428*  --  475* 502*    Basic Metabolic Panel: Recent Labs  Lab 05/23/23 2038 05/24/23 0005 05/24/23 1102 05/25/23 0232 05/26/23 0335  NA 138 135 138 132* 135  K 5.4* 3.7 3.5 3.2* 3.8  CL 108 107  --  105 105  CO2  --  20*  --  19* 22  GLUCOSE 97 96  --  123* 104*  BUN 9 7  --  7 7  CREATININE 0.80 0.83  --  0.99 0.83  CALCIUM  --  8.6*  --  8.1* 8.8*    GFR: Estimated Creatinine Clearance: 109.7 mL/min (by C-G formula based on SCr of 0.83 mg/dL).  Liver Function Tests: Recent Labs  Lab 05/24/23 0005  AST 24  ALT 17  ALKPHOS 93  BILITOT 0.8  PROT 8.2*  ALBUMIN 3.1*     CBG: No results for input(s): "GLUCAP" in the last 168 hours.   Recent Results (from the past 240 hour(s))  Blood culture (routine x 2)     Status: None (Preliminary result)   Collection Time: 05/23/23  8:20 PM   Specimen: BLOOD  Result Value Ref Range Status   Specimen Description BLOOD SITE NOT SPECIFIED  Final   Special Requests   Final    BOTTLES DRAWN AEROBIC AND ANAEROBIC Blood Culture adequate volume   Culture   Final    NO GROWTH 2 DAYS Performed at [facility], [address] [place], [place] [address]    Report Status PENDING  Incomplete  Blood culture (routine  x 2)     Status: None (Preliminary result)   Collection Time: 05/23/23  8:24 PM   Specimen: BLOOD  Result Value Ref Range Status   Specimen Description BLOOD SITE NOT SPECIFIED  Final   Special Requests   Final    BOTTLES DRAWN AEROBIC AND ANAEROBIC Blood Culture adequate volume   Culture   Final    NO GROWTH 2 DAYS Performed at [facility], [address] [place], [place] [address]    Report Status PENDING  Incomplete  Respiratory (~20 pathogens) panel by PCR     Status: None   Collection Time: 05/24/23  8:04 AM   Specimen: Nasopharyngeal Swab; Respiratory  Result Value Ref Range Status   Adenovirus NOT DETECTED NOT DETECTED Final   Coronavirus 229E NOT DETECTED NOT DETECTED Final    Comment: (NOTE) The Coronavirus on the Respiratory Panel, DOES NOT test for the novel  Coronavirus (2019 nCoV)    Coronavirus HKU1 NOT DETECTED NOT DETECTED Final   Coronavirus NL63 NOT DETECTED NOT DETECTED Final   Coronavirus OC43 NOT DETECTED NOT DETECTED Final   Metapneumovirus NOT DETECTED NOT DETECTED Final   Rhinovirus / Enterovirus NOT DETECTED NOT DETECTED Final   Influenza A NOT DETECTED NOT DETECTED Final   Influenza B NOT DETECTED NOT DETECTED Final   Parainfluenza Virus 1 NOT DETECTED NOT DETECTED Final   Parainfluenza Virus 2 NOT DETECTED NOT DETECTED Final   Parainfluenza Virus 3 NOT  DETECTED NOT DETECTED Final   Parainfluenza Virus 4 NOT DETECTED NOT DETECTED Final   Respiratory Syncytial Virus NOT DETECTED NOT DETECTED Final   Bordetella pertussis NOT DETECTED NOT DETECTED Final   Bordetella Parapertussis NOT DETECTED NOT DETECTED Final   Chlamydophila pneumoniae NOT DETECTED NOT DETECTED Final   Mycoplasma pneumoniae NOT DETECTED NOT DETECTED Final    Comment: Performed at [facility], [address], [place], [place] [address]  SARS Coronavirus 2 by RT PCR (hospital order, performed in [facility] hospital lab) *cepheid single result test* Anterior Nasal Swab     Status: None   Collection Time: 05/24/23 10:22 AM   Specimen: Anterior Nasal Swab  Result Value Ref Range Status   SARS Coronavirus 2 by RT PCR NEGATIVE NEGATIVE Final    Comment: Performed at [facility], [address], [place], [place] [address]         Radiology Studies: ECHOCARDIOGRAM COMPLETE  Result Date: 05/24/2023    ECHOCARDIOGRAM REPORT   Patient Name:   COURTNIE MUNUZ Date of Exam: 05/24/2023 Medical Rec #:  [id number]       Height:       62.0 in Accession #:    [id number]      Weight:       228.0 lb Date of Birth:  [birth year]       BSA:          2.021 m Patient Age:    32 years        BP:           150/94 mmHg Patient Gender: F               HR:           135 bpm. Exam Location:  Inpatient Procedure: 2D Echo, Cardiac Doppler and Color Doppler STAT ECHO Indications:    dyspnea.  History:        Patient has no prior history of Echocardiogram examinations.  Arrythmias:Tachycardia; Signs/Symptoms:post partum.  Sonographer:    Delcie Roch RDCS Referring Phys: [id number] ALLISON WOLFE IMPRESSIONS  1. Left ventricular ejection fraction, by estimation, is 65 to 70%. The left ventricle has normal function. The left ventricle has no regional wall motion abnormalities. There is mild concentric left ventricular hypertrophy. Indeterminate diastolic filling due to E-A fusion.   2. Right ventricular systolic function is hyperdynamic. The right ventricular size is normal. There is normal pulmonary artery systolic pressure.  3. There is no evidence of cardiac tamponade.  4. The mitral valve is normal in structure. No evidence of mitral valve regurgitation. No evidence of mitral stenosis.  5. Tricuspid valve regurgitation is mild to moderate.  6. The aortic valve is tricuspid. Aortic valve regurgitation is not visualized.  7. Cannot exclude a small PFO. Comparison(s): No prior Echocardiogram. FINDINGS  Left Ventricle: Left ventricular ejection fraction, by estimation, is 65 to 70%. The left ventricle has normal function. The left ventricle has no regional wall motion abnormalities. The left ventricular internal cavity size was normal in size. There is  mild concentric left ventricular hypertrophy. Indeterminate diastolic filling due to E-A fusion. Right Ventricle: The right ventricular size is normal. No increase in right ventricular wall thickness. Right ventricular systolic function is hyperdynamic. There is normal pulmonary artery systolic pressure. The tricuspid regurgitant velocity is 2.72 m/s, and with an assumed right atrial pressure of 3 mmHg, the estimated right ventricular systolic pressure is 32.6 mmHg. Left [facility]: Left atrial size was normal in size. Right [facility]: Right atrial size was normal in size. Pericardium: Trivial pericardial effusion is present. The pericardial effusion is surrounding the apex. There is no evidence of cardiac tamponade. Mitral Valve: The mitral valve is normal in structure. No evidence of mitral valve regurgitation. No evidence of mitral valve stenosis. Tricuspid Valve: The tricuspid valve is normal in structure. Tricuspid valve regurgitation is mild to moderate. No evidence of tricuspid stenosis. Aortic Valve: The aortic valve is tricuspid. Aortic valve regurgitation is not visualized. Pulmonic Valve: The pulmonic valve was normal in structure. Pulmonic  valve regurgitation is not visualized. No evidence of pulmonic stenosis. Aorta: The aortic root, ascending aorta and aortic arch are all structurally normal, with no evidence of dilitation or obstruction. IAS/Shunts: Cannot exclude a small PFO.  LEFT VENTRICLE PLAX 2D LVIDd:         4.50 cm   Diastology LVIDs:         3.00 cm   LV e' lateral: 12.00 cm/s LV PW:         1.10 cm LV IVS:        1.10 cm LVOT diam:     2.20 cm LV SV:         58 LV SV Index:   29 LVOT Area:     3.80 cm  RIGHT VENTRICLE             IVC RV Basal diam:  2.60 cm     IVC diam: 1.60 cm RV S prime:     19.60 cm/s TAPSE (M-mode): 2.0 cm LEFT ATRIUM             Index        RIGHT ATRIUM           Index LA diam:        3.60 cm 1.78 cm/m   RA Area:     11.50 cm LA Vol (A2C):   61.0 ml 30.18 ml/m  RA Volume:   25.70 ml  12.72 ml/m LA Vol (A4C):   51.2 ml 25.33 ml/m LA Biplane Vol: 59.2 ml 29.29 ml/m  AORTIC VALVE LVOT Vmax:   109.00 cm/s LVOT Vmean:  73.400 cm/s LVOT VTI:    0.153 m  AORTA Ao Root diam: 2.80 cm Ao Asc diam:  2.80 cm TRICUSPID VALVE TR Peak grad:   29.6 mmHg TR Vmax:        272.00 cm/s  SHUNTS Systemic VTI:  0.15 m Systemic Diam: 2.20 cm Riley Lam MD Electronically signed by Riley Lam MD Signature Date/Time: 05/24/2023/4:08:21 PM    Final         Scheduled Meds:  [START ON 05/27/2023] azithromycin  500 mg Oral Daily   enoxaparin (LOVENOX) injection  40 mg Subcutaneous Daily   lidocaine  1 patch Transdermal Daily   pantoprazole  40 mg Oral Daily   prenatal vitamin w/FE, FA  1 tablet Oral Daily   Continuous Infusions:  cefTRIAXone (ROCEPHIN)  IV 2 g (05/25/23 2101)   lactated ringers 75 mL/hr at 05/25/23 2056     LOS: 3 days     Huey Bienenstock, MD [facility]   To contact the attending provider between 7A-7P or the covering provider during after hours 7P-7A, please log into the web site [email or web address] and access using universal [place] password for that web site. If you  do not have the password, please call the hospital operator.  05/26/2023, 2:13 PM

## 2023-05-26 NOTE — Progress Notes (Signed)
Mobility Specialist Progress Note:   05/26/23 1051  Mobility  Activity Ambulated independently in hallway  Level of Assistance Independent after set-up  Assistive Device None  Distance Ambulated (ft) 100 ft  Activity Response Tolerated well  $Mobility charge 1 Mobility  Mobility Specialist Start Time (ACUTE ONLY) 1040  Mobility Specialist Stop Time (ACUTE ONLY) 1050  Mobility Specialist Time Calculation (min) (ACUTE ONLY) 10 min   RN requested O2 walking test. Pt agreeable to session. SpO2 92% on RA at rest, ambulated in hallway, SpO2 85% on RA. Increased O2 flow, SpO2 91-93% on 0.5L through remainder of session. Tolerated well, asx throughout, VSS stable. Returned pt to room, left on RA, RN notified.   Feliciana Rossetti Mobility Specialist Please contact via Special educational needs teacher or  Rehab office at [phone]

## 2023-05-26 NOTE — Progress Notes (Signed)
PHARMACIST - PHYSICIAN COMMUNICATION DR:   Elgergawy CONCERNING: Antibiotic IV to Oral Route Change Policy  RECOMMENDATION: This patient is receiving azithromycin by the intravenous route.  Based on criteria approved by the Pharmacy and Therapeutics Committee, the antibiotic(s) is/are being converted to the equivalent oral dose form(s).   DESCRIPTION: These criteria include:  Patient being treated for a respiratory tract infection, urinary tract infection, cellulitis or clostridium difficile associated diarrhea if on metronidazole  The patient is not neutropenic and does not exhibit a GI malabsorption state  The patient is eating (either orally or via tube) and/or has been taking other orally administered medications for a least 24 hours  The patient is improving clinically and has a Tmax < 100.5  If you have questions about this conversion, please contact the Pharmacy Department  []   [phone] )  [facility] []   [phone] )  [facility] [x]   [phone] )  [facility] []   [phone] )  [facility] []   [phone] )  [facility]

## 2023-05-27 DIAGNOSIS — J189 Pneumonia, unspecified organism: Secondary | ICD-10-CM | POA: Diagnosis not present

## 2023-05-27 DIAGNOSIS — A419 Sepsis, unspecified organism: Secondary | ICD-10-CM | POA: Diagnosis not present

## 2023-05-27 DIAGNOSIS — J9601 Acute respiratory failure with hypoxia: Secondary | ICD-10-CM | POA: Diagnosis not present

## 2023-05-27 LAB — CBC
HCT: 30.4 % — ABNORMAL LOW (ref 36.0–46.0)
Hemoglobin: 9.7 g/dL — ABNORMAL LOW (ref 12.0–15.0)
MCH: 21.3 pg — ABNORMAL LOW (ref 26.0–34.0)
MCHC: 31.9 g/dL (ref 30.0–36.0)
MCV: 66.8 fL — ABNORMAL LOW (ref 80.0–100.0)
Platelets: 533 10*3/uL — ABNORMAL HIGH (ref 150–400)
RBC: 4.55 MIL/uL (ref 3.87–5.11)
RDW: 18.7 % — ABNORMAL HIGH (ref 11.5–15.5)
WBC: 7.8 10*3/uL (ref 4.0–10.5)
nRBC: 0 % (ref 0.0–0.2)

## 2023-05-27 LAB — BASIC METABOLIC PANEL
Anion gap: 9 (ref 5–15)
BUN: 6 mg/dL (ref 6–20)
CO2: 21 mmol/L — ABNORMAL LOW (ref 22–32)
Calcium: 8.6 mg/dL — ABNORMAL LOW (ref 8.9–10.3)
Chloride: 104 mmol/L (ref 98–111)
Creatinine, Ser: 0.75 mg/dL (ref 0.44–1.00)
GFR, Estimated: >60 mL/min (ref >60)
Glucose, Bld: 102 mg/dL — ABNORMAL HIGH (ref 70–99)
Potassium: 3.9 mmol/L (ref 3.5–5.1)
Sodium: 134 mmol/L — ABNORMAL LOW (ref 135–145)

## 2023-05-27 NOTE — Plan of Care (Signed)
  Problem: Education: Goal: Knowledge of General Education information will improve Description: Including pain rating scale, medication(s)/side effects and non-pharmacologic comfort measures Outcome: Progressing   Problem: Health Behavior/Discharge Planning: Goal: Ability to manage health-related needs will improve Outcome: Progressing   Problem: Clinical Measurements: Goal: Ability to maintain clinical measurements within normal limits will improve Outcome: Progressing Goal: Will remain free from infection Outcome: Progressing Goal: Diagnostic test results will improve Outcome: Progressing Goal: Respiratory complications will improve Outcome: Progressing Goal: Cardiovascular complication will be avoided Outcome: Progressing   Problem: Coping: Goal: Level of anxiety will decrease Outcome: Progressing   Problem: Nutrition: Goal: Adequate nutrition will be maintained Outcome: Progressing   Problem: Elimination: Goal: Will not experience complications related to bowel motility Outcome: Progressing Goal: Will not experience complications related to urinary retention Outcome: Progressing   Problem: Pain Management: Goal: General experience of comfort will improve Outcome: Progressing   Problem: Safety: Goal: Ability to remain free from injury will improve Outcome: Progressing

## 2023-05-27 NOTE — Progress Notes (Signed)
PROGRESS NOTE    Pamela Erickson  WJX:[id number] DOB: October 08, 1990 DOA: 05/23/2023 PCP: Arnette Felts, FNP  Chief Complaint  Patient presents with   Tachycardia    Brief Narrative:     Pamela Erickson is a 32 y.o. 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  Patient with sepsis and respiratory failure on presentation, CTA chest negative for PE, but significant for left lower lobe pneumonia, she is admitted for further management.    Assessment & Plan:   Principal Problem:   Sepsis due to pneumonia [facility]) Active Problems:   Elevated troponin   Acute respiratory failure with hypoxia (HCC)   Breast feeding status of mother   Microcytic anemia   Sjogren's syndrome (HCC)   Sepsis due to pneumonia  -Patient presents with fever, leukocytosis, respiratory distress, tachypnea. -CTA chest negative for PE but significant for pneumonia. -Significantly tachypneic this morning with diminished air entry and shallow breathing, I have discussed with her at length about the importance of using incentive spirometry, flutter valve. -resp panel is negative -COVID-19 is negative -Continue with IV azithromycin and Rocephin -Blood cultures remain negative -Follow on Legionella, still pending. -She is afebrile over last 24 hours, leukocytosis has resolved, remains with mild hypoxia with activity and at bedtime, Picardi has improved as well.   Acute respiratory failure with hypoxia (HCC) Has desaturation to mid 80s with exertion and quickly rebounds with rest, she is with significant dyspnea breathing at 30 breaths/min initially, but this has much improved, mildly dyspneic and tachypneic with activity with mild hypoxia today requiring 1 to 2 L oxygen only.  And no oxygen at rest. CTA ruled out  PE Treating for pneumonia Encouraged use incentive spirometry, flutter valve  Elevated troponin -Non-ACS pattern, secondary to sepsis and respiratory failure, 2D echo with a preserved EF and no regional wall motion abnormalities  Breast feeding status of mother Discussed safety of medication and will ask pharmacy to verify as well She is pumping and dumping while in hospital, esp. 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  DVT prophylaxis: Lovenox Code Status: Full Family Communication: None at bedside Disposition:   Status is: Inpatient    Consultants:  None  Subjective: Patient remains afebrile, she denies chest pain, shortness of breath at this point, but still with cough, reports has been compliant with incentive spirometer and flutter valve  Objective: Vitals:   05/27/23 0437 05/27/23 0906 05/27/23 1228 05/27/23 1229  BP: (!) 144/98 (!) 138/94 127/84 127/84  Pulse: (!) 123 (!) 119 (!) 110 (!) 102  Resp:  19 20 19   Temp: 100.1 F (37.8 C) 98 F (36.7 C) 97.7 F (36.5 C) 97.7 F (36.5  C)  TempSrc: Oral Oral Oral Oral  SpO2: 91% 91% 90% 90%  Weight:      Height:       No intake or output data in the 24 hours ending 05/27/23 1510  Filed Weights   05/23/23 2010  Weight: 103.4 kg    Examination:  Awake Alert, Oriented X 3, No new F.N deficits, Normal  affect Symmetrical Chest wall movement, Good air movement bilaterally, right lung Rales Tachycardic,No Gallops,Rubs or new Murmurs, No Parasternal Heave +ve B.Sounds, Abd Soft, No tenderness, No rebound - guarding or rigidity. No Cyanosis, Clubbing or edema, No new Rash or bruise     Data Reviewed: I have personally reviewed following labs and imaging studies  CBC: Recent Labs  Lab 05/24/23 0005 05/24/23 1102 05/25/23 0232 05/26/23 0335 05/27/23 0346  WBC 12.9*  --  12.2* 10.4 7.8  NEUTROABS 10.1*  --   --   --   --   HGB 10.4* 10.9* 9.8* 10.4* 9.7*  HCT 33.1* 32.0* 30.5* 33.4* 30.4*  MCV 68.0*  --  66.9* 68.2* 66.8*  PLT 428*  --  475* 502* 533*    Basic Metabolic Panel: Recent Labs  Lab 05/23/23 2038 05/24/23 0005 05/24/23 1102 05/25/23 0232 05/26/23 0335 05/27/23 0346  NA 138 135 138 132* 135 134*  K 5.4* 3.7 3.5 3.2* 3.8 3.9  CL 108 107  --  105 105 104  CO2  --  20*  --  19* 22 21*  GLUCOSE 97 96  --  123* 104* 102*  BUN 9 7  --  7 7 6   CREATININE 0.80 0.83  --  0.99 0.83 0.75  CALCIUM  --  8.6*  --  8.1* 8.8* 8.6*    GFR: Estimated Creatinine Clearance: 113.8 mL/min (by C-G formula based on SCr of 0.75 mg/dL).  Liver Function Tests: Recent Labs  Lab 05/24/23 0005  AST 24  ALT 17  ALKPHOS 93  BILITOT 0.8  PROT 8.2*  ALBUMIN 3.1*    CBG: No results for input(s): "GLUCAP" in the last 168 hours.   Recent Results (from the past 240 hour(s))  Blood culture (routine x 2)     Status: None (Preliminary result)   Collection Time: 05/23/23  8:20 PM   Specimen: BLOOD  Result Value Ref Range Status   Specimen Description BLOOD SITE NOT SPECIFIED  Final   Special Requests   Final    BOTTLES DRAWN AEROBIC AND ANAEROBIC Blood Culture adequate volume   Culture   Final    NO GROWTH 3 DAYS Performed at [facility], [address] [place], [place] [address]    Report Status PENDING  Incomplete  Blood culture (routine x 2)     Status: None  (Preliminary result)   Collection Time: 05/23/23  8:24 PM   Specimen: BLOOD  Result Value Ref Range Status   Specimen Description BLOOD SITE NOT SPECIFIED  Final   Special Requests   Final    BOTTLES DRAWN AEROBIC AND ANAEROBIC Blood Culture adequate volume   Culture   Final    NO GROWTH 3 DAYS Performed at [facility], [address], [place], [place] [address]    Report Status PENDING  Incomplete  Respiratory (~20 pathogens) panel by PCR     Status: None   Collection Time: 05/24/23  8:04 AM   Specimen: Nasopharyngeal Swab; Respiratory  Result Value Ref Range Status   Adenovirus NOT DETECTED NOT DETECTED Final  Coronavirus 229E NOT DETECTED NOT DETECTED Final    Comment: (NOTE) The Coronavirus on the Respiratory Panel, DOES NOT test for the novel  Coronavirus (2019 nCoV)    Coronavirus HKU1 NOT DETECTED NOT DETECTED Final   Coronavirus NL63 NOT DETECTED NOT DETECTED Final   Coronavirus OC43 NOT DETECTED NOT DETECTED Final   Metapneumovirus NOT DETECTED NOT DETECTED Final   Rhinovirus / Enterovirus NOT DETECTED NOT DETECTED Final   Influenza A NOT DETECTED NOT DETECTED Final   Influenza B NOT DETECTED NOT DETECTED Final   Parainfluenza Virus 1 NOT DETECTED NOT DETECTED Final   Parainfluenza Virus 2 NOT DETECTED NOT DETECTED Final   Parainfluenza Virus 3 NOT DETECTED NOT DETECTED Final   Parainfluenza Virus 4 NOT DETECTED NOT DETECTED Final   Respiratory Syncytial Virus NOT DETECTED NOT DETECTED Final   Bordetella pertussis NOT DETECTED NOT DETECTED Final   Bordetella Parapertussis NOT DETECTED NOT DETECTED Final   Chlamydophila pneumoniae NOT DETECTED NOT DETECTED Final   Mycoplasma pneumoniae NOT DETECTED NOT DETECTED Final    Comment: Performed at [facility], [address], [place], [place] [address]  SARS Coronavirus 2 by RT PCR (hospital order, performed in [facility] hospital lab) *cepheid single result test* Anterior Nasal Swab     Status: None    Collection Time: 05/24/23 10:22 AM   Specimen: Anterior Nasal Swab  Result Value Ref Range Status   SARS Coronavirus 2 by RT PCR NEGATIVE NEGATIVE Final    Comment: Performed at [facility], [address], [place], [place] [address]         Radiology Studies: No results found.      Scheduled Meds:  azithromycin  500 mg Oral Daily   enoxaparin (LOVENOX) injection  40 mg Subcutaneous Daily   lidocaine  1 patch Transdermal Daily   pantoprazole  40 mg Oral Daily   prenatal vitamin w/FE, FA  1 tablet Oral Daily   Continuous Infusions:  cefTRIAXone (ROCEPHIN)  IV 2 g (05/26/23 2128)     LOS: 4 days     Huey Bienenstock, MD [facility]   To contact the attending provider between 7A-7P or the covering provider during after hours 7P-7A, please log into the web site [email or web address] and access using universal [place] password for that web site. If you do not have the password, please call the hospital operator.  05/27/2023, 3:10 PM

## 2023-05-28 DIAGNOSIS — J9601 Acute respiratory failure with hypoxia: Secondary | ICD-10-CM | POA: Diagnosis not present

## 2023-05-28 DIAGNOSIS — A419 Sepsis, unspecified organism: Secondary | ICD-10-CM | POA: Diagnosis not present

## 2023-05-28 DIAGNOSIS — D509 Iron deficiency anemia, unspecified: Secondary | ICD-10-CM

## 2023-05-28 DIAGNOSIS — J189 Pneumonia, unspecified organism: Secondary | ICD-10-CM | POA: Diagnosis not present

## 2023-05-28 LAB — BASIC METABOLIC PANEL
Anion gap: 8 (ref 5–15)
BUN: 5 mg/dL — ABNORMAL LOW (ref 6–20)
CO2: 24 mmol/L (ref 22–32)
Calcium: 8.7 mg/dL — ABNORMAL LOW (ref 8.9–10.3)
Chloride: 104 mmol/L (ref 98–111)
Creatinine, Ser: 0.76 mg/dL (ref 0.44–1.00)
GFR, Estimated: >60 mL/min (ref >60)
Glucose, Bld: 92 mg/dL (ref 70–99)
Potassium: 3.6 mmol/L (ref 3.5–5.1)
Sodium: 136 mmol/L (ref 135–145)

## 2023-05-28 LAB — CBC
HCT: 32.4 % — ABNORMAL LOW (ref 36.0–46.0)
Hemoglobin: 10.2 g/dL — ABNORMAL LOW (ref 12.0–15.0)
MCH: 21.5 pg — ABNORMAL LOW (ref 26.0–34.0)
MCHC: 31.5 g/dL (ref 30.0–36.0)
MCV: 68.2 fL — ABNORMAL LOW (ref 80.0–100.0)
Platelets: 582 10*3/uL — ABNORMAL HIGH (ref 150–400)
RBC: 4.75 MIL/uL (ref 3.87–5.11)
RDW: 18.3 % — ABNORMAL HIGH (ref 11.5–15.5)
WBC: 5.2 10*3/uL (ref 4.0–10.5)
nRBC: 0 % (ref 0.0–0.2)

## 2023-05-28 LAB — LEGIONELLA PNEUMOPHILA SEROGP 1 UR AG: L. pneumophila Serogp 1 Ur Ag: NEGATIVE

## 2023-05-28 MED ORDER — FERROUS SULFATE 325 (65 FE) MG PO TABS
325.0000 mg | ORAL_TABLET | Freq: Two times a day (BID) | ORAL | Status: DC
  Administered 2023-05-28 – 2023-05-29 (×2): 325 mg via ORAL
  Filled 2023-05-28 (×2): qty 1

## 2023-05-28 NOTE — Plan of Care (Signed)

## 2023-05-28 NOTE — Plan of Care (Signed)

## 2023-05-28 NOTE — Progress Notes (Signed)
Mobility Specialist Progress Note;   05/28/23 1355  Mobility  Activity Ambulated independently in hallway  Level of Assistance Independent after set-up  Assistive Device None  Distance Ambulated (ft) 425 ft  Activity Response Tolerated well  Mobility Referral Yes  $Mobility charge 1 Mobility  Mobility Specialist Start Time (ACUTE ONLY) 1355  Mobility Specialist Stop Time (ACUTE ONLY) 1410  Mobility Specialist Time Calculation (min) (ACUTE ONLY) 15 min   Pt standing at doorway upon arrival, agreeable to mobility. States she has been trying to get up and move. Required no physical assistance during ambulation. Ambulating on RA w/ no c/o. Pt returned back to room with all needs met.   Caesar Bookman Mobility Specialist Please contact via SecureChat or Rehab Office [phone]

## 2023-05-28 NOTE — Progress Notes (Addendum)
PROGRESS NOTE        PATIENT DETAILS Name: Pamela Erickson Age: 32 y.o. Sex: female Date of Birth: Apr 27, 1991 Admit Date: 05/23/2023 Admitting Physician Briscoe Deutscher, MD [id number], Lolita Cram, FNP  Brief Summary: Patient is a 32 y.o.  female with history of Sjogren syndrome (on Plaquenil as needed)-3 months postpartum-presented with shortness of breath/right-sided pleuritic back pain-found to have PNA and subsequently admitted to the hospitalist service  Significant events: 11/15>> admit to [facility]  Significant studies: 11/15>> CTA chest: No PE-consolidation in the right> left lower lobes-suspicious for pneumonia  Significant microbiology data: 11/15>> blood culture: No growth 11/16>> COVID PCR: Negative 11/16>> respiratory virus panel: Negative  Procedures:   Consults: None  Subjective: Lying comfortably in bed-denies any chest pain or shortness of breath.  Objective: Vitals: Blood pressure (!) 137/95, pulse (!) 118, temperature 97.7 F (36.5 C), temperature source Oral, resp. rate 18, height 5\' 2"  (1.575 m), weight 103.4 kg, SpO2 (!) 89%, currently breastfeeding.   Exam: Gen Exam:Alert awake-not in any distress HEENT:atraumatic, normocephalic Chest: B/L clear to auscultation anteriorly CVS:S1S2 regular Abdomen:soft non tender, non distended Extremities:no edema Neurology: Non focal Skin: no rash  Pertinent Labs/Radiology:    Latest Ref Rng & Units 05/28/2023    4:59 AM 05/27/2023    3:46 AM 05/26/2023    3:35 AM  CBC  WBC 4.0 - 10.5 K/uL 5.2  7.8  10.4   Hemoglobin 12.0 - 15.0 g/dL 14.7  9.7  82.9   Hematocrit 36.0 - 46.0 % 32.4  30.4  33.4   Platelets 150 - 400 K/uL 582  533  502     Lab Results  Component Value Date   NA 136 05/28/2023   K 3.6 05/28/2023   CL 104 05/28/2023   CO2 24 05/28/2023      Assessment/Plan: Sepsis secondary to multifocal pneumonia Sepsis physiology improved Cultures negative so far Continue  antibiotics-if clinical improvement continues-Home likely on 11/21  Acute hypoxic respiratory failure Secondary to pneumonia On 1-2 L of oxygen overnight-titrated to room air this morning Encourage mobilization/pulmonary toileting  Sjogren's syndrome On as needed Plaquenil-follows with Rheum at [facility]  Microcytic anemia Likely due to iron deficiency Iron supplementation  3 months postpartum Breast-feeding Pumping and dumping while in the hospital  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  Obesity: Estimated body mass index is 41.7 kg/m as calculated from the following:   Height as of this encounter: 5\' 2"  (1.575 m).   Weight as of this encounter: 103.4 kg.   Code status:   Code Status: Full Code   DVT Prophylaxis: enoxaparin (LOVENOX) injection 40 mg Start: 05/24/23 1000   Family Communication: None at bedside   Disposition Plan: Status is: Inpatient Remains inpatient appropriate because: Severity of illness   Planned Discharge Destination:Home   Diet: Diet Order             Diet regular Fluid consistency: Thin  Diet effective now                     Antimicrobial agents: Anti-infectives (From admission, onward)    Start     Dose/Rate Route Frequency Ordered Stop   05/27/23 1000  azithromycin (ZITHROMAX) tablet 500 mg        500 mg Oral Daily  05/26/23 1339 05/28/23 0842   05/24/23 2200  cefTRIAXone (ROCEPHIN) 2 g in sodium chloride 0.9 % 100 mL IVPB        2 g 200 mL/hr over 30 Minutes Intravenous Daily at bedtime 05/24/23 0817 05/29/23 2159   05/24/23 0900  azithromycin (ZITHROMAX) 500 mg in dextrose 5 % 250 mL IVPB  Status:  Discontinued        500 mg 250 mL/hr over 60 Minutes Intravenous Daily 05/24/23 0817 05/26/23 1339   05/23/23 2330  cefTRIAXone (ROCEPHIN) 2 g in sodium chloride 0.9 % 100 mL IVPB        2 g 200 mL/hr over 30 Minutes Intravenous  Once  05/23/23 2328 05/24/23 0051        MEDICATIONS: Scheduled Meds:  enoxaparin (LOVENOX) injection  40 mg Subcutaneous Daily   lidocaine  1 patch Transdermal Daily   pantoprazole  40 mg Oral Daily   prenatal vitamin w/FE, FA  1 tablet Oral Daily   Continuous Infusions:  cefTRIAXone (ROCEPHIN)  IV 2 g (05/27/23 2130)   PRN Meds:.acetaminophen, cyclobenzaprine, hydrALAZINE, HYDROmorphone (DILAUDID) injection, ibuprofen, levalbuterol, metoprolol tartrate, ondansetron (ZOFRAN) IV, oxyCODONE   I have personally reviewed following labs and imaging studies  LABORATORY DATA: CBC: Recent Labs  Lab 05/24/23 0005 05/24/23 1102 05/25/23 0232 05/26/23 0335 05/27/23 0346 05/28/23 0459  WBC 12.9*  --  12.2* 10.4 7.8 5.2  NEUTROABS 10.1*  --   --   --   --   --   HGB 10.4* 10.9* 9.8* 10.4* 9.7* 10.2*  HCT 33.1* 32.0* 30.5* 33.4* 30.4* 32.4*  MCV 68.0*  --  66.9* 68.2* 66.8* 68.2*  PLT 428*  --  475* 502* 533* 582*    Basic Metabolic Panel: Recent Labs  Lab 05/24/23 0005 05/24/23 1102 05/25/23 0232 05/26/23 0335 05/27/23 0346 05/28/23 0459  NA 135 138 132* 135 134* 136  K 3.7 3.5 3.2* 3.8 3.9 3.6  CL 107  --  105 105 104 104  CO2 20*  --  19* 22 21* 24  GLUCOSE 96  --  123* 104* 102* 92  BUN 7  --  7 7 6  5*  CREATININE 0.83  --  0.99 0.83 0.75 0.76  CALCIUM 8.6*  --  8.1* 8.8* 8.6* 8.7*    GFR: Estimated Creatinine Clearance: 113.8 mL/min (by C-G formula based on SCr of 0.76 mg/dL).  Liver Function Tests: Recent Labs  Lab 05/24/23 0005  AST 24  ALT 17  ALKPHOS 93  BILITOT 0.8  PROT 8.2*  ALBUMIN 3.1*   No results for input(s): "LIPASE", "AMYLASE" in the last 168 hours. No results for input(s): "AMMONIA" in the last 168 hours.  Coagulation Profile: No results for input(s): "INR", "PROTIME" in the last 168 hours.  Cardiac Enzymes: No results for input(s): "CKTOTAL", "CKMB", "CKMBINDEX", "TROPONINI" in the last 168 hours.  BNP (last 3 results) No results for  input(s): "PROBNP" in the last 8760 hours.  Lipid Profile: Recent Labs    05/26/23 0334  CHOL 138  HDL 41  LDLCALC 83  TRIG 68  CHOLHDL 3.4    Thyroid Function Tests: Recent Labs    05/26/23 0335  TSH 0.112*  FREET4 1.03    Anemia Panel: Recent Labs    05/26/23 0335  FERRITIN 93    Urine analysis:    Component Value Date/Time   COLORURINE YELLOW 02/01/2018 2052   APPEARANCEUR HAZY (A) 02/01/2018 2052   LABSPEC 1.014 02/01/2018 2052   PHURINE 7.0 02/01/2018  2052   GLUCOSEU NEGATIVE 02/01/2018 2052   HGBUR SMALL (A) 02/01/2018 2052   BILIRUBINUR negative 02/16/2019 1543   KETONESUR NEGATIVE 02/01/2018 2052   PROTEINUR Negative 02/16/2019 1543   PROTEINUR NEGATIVE 02/01/2018 2052   UROBILINOGEN 0.2 02/16/2019 1543   UROBILINOGEN 0.2 10/07/2012 0836   NITRITE negative 02/16/2019 1543   NITRITE NEGATIVE 02/01/2018 2052   LEUKOCYTESUR Negative 02/16/2019 1543    Sepsis Labs: Lactic Acid, Venous    Component Value Date/Time   LATICACIDVEN 0.8 05/24/2023 0016    MICROBIOLOGY: Recent Results (from the past 240 hour(s))  Blood culture (routine x 2)     Status: None (Preliminary result)   Collection Time: 05/23/23  8:20 PM   Specimen: BLOOD  Result Value Ref Range Status   Specimen Description BLOOD SITE NOT SPECIFIED  Final   Special Requests   Final    BOTTLES DRAWN AEROBIC AND ANAEROBIC Blood Culture adequate volume   Culture   Final    NO GROWTH 4 DAYS Performed at [facility], [address] [place], [place] [address]    Report Status PENDING  Incomplete  Blood culture (routine x 2)     Status: None (Preliminary result)   Collection Time: 05/23/23  8:24 PM   Specimen: BLOOD  Result Value Ref Range Status   Specimen Description BLOOD SITE NOT SPECIFIED  Final   Special Requests   Final    BOTTLES DRAWN AEROBIC AND ANAEROBIC Blood Culture adequate volume   Culture   Final    NO GROWTH 4 DAYS Performed at [facility], [address] [place], [place] [address]    Report Status PENDING  Incomplete  Respiratory (~20 pathogens) panel by PCR     Status: None   Collection Time: 05/24/23  8:04 AM   Specimen: Nasopharyngeal Swab; Respiratory  Result Value Ref Range Status   Adenovirus NOT DETECTED NOT DETECTED Final   Coronavirus 229E NOT DETECTED NOT DETECTED Final    Comment: (NOTE) The Coronavirus on the Respiratory Panel, DOES NOT test for the novel  Coronavirus (2019 nCoV)    Coronavirus HKU1 NOT DETECTED NOT DETECTED Final   Coronavirus NL63 NOT DETECTED NOT DETECTED Final   Coronavirus OC43 NOT DETECTED NOT DETECTED Final   Metapneumovirus NOT DETECTED NOT DETECTED Final   Rhinovirus / Enterovirus NOT DETECTED NOT DETECTED Final   Influenza A NOT DETECTED NOT DETECTED Final   Influenza B NOT DETECTED NOT DETECTED Final   Parainfluenza Virus 1 NOT DETECTED NOT DETECTED Final   Parainfluenza Virus 2 NOT DETECTED NOT DETECTED Final   Parainfluenza Virus 3 NOT DETECTED NOT DETECTED Final   Parainfluenza Virus 4 NOT DETECTED NOT DETECTED Final   Respiratory Syncytial Virus NOT DETECTED NOT DETECTED Final   Bordetella pertussis NOT DETECTED NOT DETECTED Final   Bordetella Parapertussis NOT DETECTED NOT DETECTED Final   Chlamydophila pneumoniae NOT DETECTED NOT DETECTED Final   Mycoplasma pneumoniae NOT DETECTED NOT DETECTED Final    Comment: Performed at [facility], [address] [place], [place] [address]  SARS Coronavirus 2 by RT PCR (hospital order, performed in [facility] hospital lab) *cepheid single result test* Anterior Nasal Swab     Status: None   Collection Time: 05/24/23 10:22 AM   Specimen: Anterior Nasal Swab  Result Value Ref Range Status   SARS Coronavirus 2 by RT PCR NEGATIVE NEGATIVE Final    Comment: Performed at [facility] Lab, [address],  [place], [place] [address]    RADIOLOGY STUDIES/RESULTS: No results found.   LOS: 5 days   Jeoffrey Massed, MD  [facility]    To contact the attending provider between 7A-7P or the covering provider during after hours 7P-7A, please log into the web site [email or web address] and access using universal [place] password for that web site. If you do not have the password, please call the hospital operator.  05/28/2023, 1:52 PM

## 2023-05-29 ENCOUNTER — Other Ambulatory Visit (HOSPITAL_COMMUNITY): Payer: Self-pay

## 2023-05-29 DIAGNOSIS — J9601 Acute respiratory failure with hypoxia: Secondary | ICD-10-CM | POA: Diagnosis not present

## 2023-05-29 DIAGNOSIS — D509 Iron deficiency anemia, unspecified: Secondary | ICD-10-CM | POA: Diagnosis not present

## 2023-05-29 DIAGNOSIS — A419 Sepsis, unspecified organism: Secondary | ICD-10-CM | POA: Diagnosis not present

## 2023-05-29 DIAGNOSIS — R7989 Other specified abnormal findings of blood chemistry: Secondary | ICD-10-CM | POA: Diagnosis not present

## 2023-05-29 DIAGNOSIS — J189 Pneumonia, unspecified organism: Secondary | ICD-10-CM | POA: Diagnosis not present

## 2023-05-29 LAB — CULTURE, BLOOD (ROUTINE X 2)
Culture: NO GROWTH
Culture: NO GROWTH
Special Requests: ADEQUATE
Special Requests: ADEQUATE

## 2023-05-29 MED ORDER — AMOXICILLIN-POT CLAVULANATE 875-125 MG PO TABS
1.0000 | ORAL_TABLET | Freq: Two times a day (BID) | ORAL | 0 refills | Status: AC
  Filled 2023-05-29: qty 2, 1d supply, fill #0

## 2023-05-29 MED ORDER — FERROUS SULFATE 325 (65 FE) MG PO TABS
325.0000 mg | ORAL_TABLET | Freq: Two times a day (BID) | ORAL | 1 refills | Status: AC
  Filled 2023-05-29: qty 60, 30d supply, fill #0

## 2023-05-29 NOTE — Plan of Care (Signed)

## 2023-05-29 NOTE — Plan of Care (Signed)
  Problem: Education: Goal: Knowledge of General Education information will improve Description: Including pain rating scale, medication(s)/side effects and non-pharmacologic comfort measures Outcome: Completed/Met   Problem: Health Behavior/Discharge Planning: Goal: Ability to manage health-related needs will improve Outcome: Completed/Met   Problem: Clinical Measurements: Goal: Ability to maintain clinical measurements within normal limits will improve Outcome: Completed/Met Goal: Will remain free from infection Outcome: Completed/Met Goal: Diagnostic test results will improve Outcome: Completed/Met Goal: Respiratory complications will improve Outcome: Completed/Met Goal: Cardiovascular complication will be avoided Outcome: Completed/Met   Problem: Activity: Goal: Risk for activity intolerance will decrease Outcome: Completed/Met   Problem: Nutrition: Goal: Adequate nutrition will be maintained Outcome: Completed/Met   Problem: Coping: Goal: Level of anxiety will decrease Outcome: Completed/Met   Problem: Elimination: Goal: Will not experience complications related to bowel motility Outcome: Completed/Met Goal: Will not experience complications related to urinary retention Outcome: Completed/Met   Problem: Pain Management: Goal: General experience of comfort will improve Outcome: Completed/Met   Problem: Safety: Goal: Ability to remain free from injury will improve Outcome: Completed/Met   Problem: Skin Integrity: Goal: Risk for impaired skin integrity will decrease Outcome: Completed/Met   Problem: Activity: Goal: Ability to tolerate increased activity will improve Outcome: Completed/Met   Problem: Clinical Measurements: Goal: Ability to maintain a body temperature in the normal range will improve Outcome: Completed/Met   Problem: Respiratory: Goal: Ability to maintain adequate ventilation will improve Outcome: Completed/Met Goal: Ability to maintain a  clear airway will improve Outcome: Completed/Met

## 2023-05-29 NOTE — TOC Transition Note (Signed)
Transition of Care [facility]) - CM/SW Discharge Note   Patient Details  Name: Pamela Erickson MRN: [id number] Date of Birth: 1990-11-03  Transition of Care [facility]) CM/SW Contact:  Gordy Clement, RN Phone Number: 05/29/2023, 9:16 AM   Clinical Narrative Patient will DC to home today. No TOC needs identified   Family to transport           Patient Goals and CMS Choice      Discharge Placement                         Discharge Plan and Services Additional resources added to the After Visit Summary for                                       Social Determinants of Health (SDOH) Interventions SDOH Screenings   Food Insecurity: No Food Insecurity (05/24/2023)  Housing: Low Risk  (05/24/2023)  Transportation Needs: No Transportation Needs (05/24/2023)  Utilities: Not At Risk (05/24/2023)  Depression (PHQ2-9): Low Risk  (10/01/2022)  Financial Resource Strain: Low Risk  (09/14/2018)  Stress: No Stress Concern Present (09/14/2018)  Tobacco Use: Low Risk  (05/23/2023)     Readmission Risk Interventions     No data to display

## 2023-05-29 NOTE — Discharge Summary (Signed)
PATIENT DETAILS Name: Pamela Erickson Age: 32 y.o. Sex: female Date of Birth: March 23, 1991 MRN: [id number]. Admitting Physician: Pamela Deutscher, MD [id number], Pamela Cram, FNP  Admit Date: 05/23/2023 Discharge date: 05/29/2023  Recommendations for Outpatient Follow-up:  Follow up with PCP in 1-2 weeks Please obtain CMP/CBC in one week Repeat thyroid function panel in 4 to 6 weeks.  Admitted From:  Home  Disposition: Home   Discharge Condition: good  CODE STATUS:   Code Status: Full Code   Diet recommendation:  Diet Order             Diet general           Diet regular Fluid consistency: Thin  Diet effective now                    Brief Summary: Patient is a 32 y.o.  female with history of Sjogren syndrome (on Plaquenil as needed)-3 months postpartum-presented with shortness of breath/right-sided pleuritic back pain-found to have PNA and subsequently admitted to the hospitalist service   Significant events: 11/15>> admit to [facility]   Significant studies: 11/15>> CTA chest: No PE-consolidation in the right> left lower lobes-suspicious for pneumonia   Significant microbiology data: 11/15>> blood culture: No growth 11/16>> COVID PCR: Negative 11/16>> respiratory virus panel: Negative   Procedures:     Consults: None  Brief Hospital Course: Sepsis secondary to multifocal pneumonia Sepsis physiology improved Cultures negative so far Clinically improved with IV antibiotics-will discharge on Augmentin for a few more days.   Acute hypoxic respiratory failure Secondary to pneumonia Has been treated to room air-with stable O2 saturations this morning Ambulated with mobility tech on room air yesterday without any major issues.   Sjogren's syndrome On as needed Plaquenil-follows with Rheum at [facility]   Microcytic anemia Likely due to iron deficiency Iron supplementation   3 months postpartum Breast-feeding She was pumping and dumping  in the hospital-she intends to breast-feed when she is back home.   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   Obesity: Estimated body mass index is 41.7 kg/m as calculated from the following:   Height as of this encounter: 5\' 2"  (1.575 m).   Weight as of this encounter: 103.4 kg   Discharge Diagnoses:  Principal Problem:   Sepsis due to pneumonia [facility]) Active Problems:   Elevated troponin   Acute respiratory failure with hypoxia (HCC)   Breast feeding status of mother   Microcytic anemia   Sjogren's syndrome [facility])   Discharge Instructions:  Activity:  As tolerated  Discharge Instructions     Call MD for:  difficulty breathing, headache or visual disturbances   Complete by: As directed    Call MD for:  extreme fatigue   Complete by: As directed    Diet general   Complete by: As directed    Discharge instructions   Complete by: As directed    Follow with Primary MD  Pamela Felts, FNP in 1-2 weeks  Your thyroid function tests were abnormal-it is likely because of acute illness-please ask your primary care practitioner to repeat your thyroid function panel in 4 weeks.  Please get a complete blood count and chemistry panel checked by your Primary MD at your next visit, and again as instructed by your Primary MD.  Get Medicines reviewed and adjusted: Please take all your medications with you for your next visit with your Primary MD  Laboratory/radiological data:  Please request your Primary MD to go over all hospital tests and procedure/radiological results at the follow up, please ask your Primary MD to get all Hospital records sent to his/her office.  In some cases, they will be blood work, cultures and biopsy results pending at the time of your discharge. Please request that your primary care M.D. follows up on these results.  Also Note the following: If you experience worsening of your admission  symptoms, develop shortness of breath, life threatening emergency, suicidal or homicidal thoughts you must seek medical attention immediately by calling 911 or calling your MD immediately  if symptoms less severe.  You must read complete instructions/literature along with all the possible adverse reactions/side effects for all the Medicines you take and that have been prescribed to you. Take any new Medicines after you have completely understood and accpet all the possible adverse reactions/side effects.   Do not drive when taking Pain medications or sleeping medications (Benzodaizepines)  Do not take more than prescribed Pain, Sleep and Anxiety Medications. It is not advisable to combine anxiety,sleep and pain medications without talking with your primary care practitioner  Special Instructions: If you have smoked or chewed Tobacco  in the last 2 yrs please stop smoking, stop any regular Alcohol  and or any Recreational drug use.  Wear Seat belts while driving.  Please note: You were cared for by a hospitalist during your hospital stay. Once you are discharged, your primary care physician will handle any further medical issues. Please note that NO REFILLS for any discharge medications will be authorized once you are discharged, as it is imperative that you return to your primary care physician (or establish a relationship with a primary care physician if you do not have one) for your post hospital discharge needs so that they can reassess your need for medications and monitor your lab values.   Increase activity slowly   Complete by: As directed       Allergies as of 05/29/2023       Reactions   Cephalexin    Patient was diagnosed with a disease process, and is not allergic to this medication.         Medication List     TAKE these medications    amoxicillin-clavulanate 875-125 MG tablet Commonly known as: AUGMENTIN Take 1 tablet by mouth 2 (two) times daily for 1 day.    cyclobenzaprine 5 MG tablet Commonly known as: FLEXERIL Take 1 tablet (5 mg total) by mouth 2 (two) times daily as needed for muscle spasms.   ferrous sulfate 325 (65 FE) MG tablet Take 1 tablet (325 mg total) by mouth 2 (two) times daily with a meal.   ibuprofen 600 MG tablet Commonly known as: ADVIL Take 1 tablet (600 mg total) by mouth every 6 (six) hours as needed.   norethindrone 0.35 MG tablet Commonly known as: MICRONOR Take 1 tablet by mouth daily.   PRENATAL ADULT GUMMY/DHA/FA PO Take by mouth daily.   valACYclovir 500 MG tablet Commonly known as: VALTREX Take 500 mg by mouth as needed.        Follow-up Information     Pamela Felts, FNP. Schedule an appointment as soon as possible for a visit in 1 week(s).   Specialty: General Practice Contact information: [address] [place] [address] [phone]         Hands, Jenel Lucks, NP. Schedule an appointment as soon as possible for a visit in 1 week(s).   Specialty:  Obstetrics and Gynecology Contact information: [address] [place] [address] [phone]                Allergies  Allergen Reactions   Cephalexin     Patient was diagnosed with a disease process, and is not allergic to this medication.      Other Procedures/Studies: ECHOCARDIOGRAM COMPLETE  Result Date: 05/24/2023    ECHOCARDIOGRAM REPORT   Patient Name:   Pamela Erickson Date of Exam: 05/24/2023 Medical Rec #:  [id number]       Height:       62.0 in Accession #:    [id number]      Weight:       228.0 lb Date of Birth:  [birth year]       BSA:          2.021 m Patient Age:    32 years        BP:           150/94 mmHg Patient Gender: F               HR:           135 bpm. Exam Location:  Inpatient Procedure: 2D Echo, Cardiac Doppler and Color Doppler STAT ECHO Indications:    dyspnea.  History:        Patient has no prior history of Echocardiogram examinations.                 Arrythmias:Tachycardia;  Signs/Symptoms:post partum.  Sonographer:    Delcie Roch RDCS Referring Phys: [id number] ALLISON WOLFE IMPRESSIONS  1. Left ventricular ejection fraction, by estimation, is 65 to 70%. The left ventricle has normal function. The left ventricle has no regional wall motion abnormalities. There is mild concentric left ventricular hypertrophy. Indeterminate diastolic filling due to E-A fusion.  2. Right ventricular systolic function is hyperdynamic. The right ventricular size is normal. There is normal pulmonary artery systolic pressure.  3. There is no evidence of cardiac tamponade.  4. The mitral valve is normal in structure. No evidence of mitral valve regurgitation. No evidence of mitral stenosis.  5. Tricuspid valve regurgitation is mild to moderate.  6. The aortic valve is tricuspid. Aortic valve regurgitation is not visualized.  7. Cannot exclude a small PFO. Comparison(s): No prior Echocardiogram. FINDINGS  Left Ventricle: Left ventricular ejection fraction, by estimation, is 65 to 70%. The left ventricle has normal function. The left ventricle has no regional wall motion abnormalities. The left ventricular internal cavity size was normal in size. There is  mild concentric left ventricular hypertrophy. Indeterminate diastolic filling due to E-A fusion. Right Ventricle: The right ventricular size is normal. No increase in right ventricular wall thickness. Right ventricular systolic function is hyperdynamic. There is normal pulmonary artery systolic pressure. The tricuspid regurgitant velocity is 2.72 m/s, and with an assumed right atrial pressure of 3 mmHg, the estimated right ventricular systolic pressure is 32.6 mmHg. Left Atrium: Left atrial size was normal in size. Right Atrium: Right atrial size was normal in size. Pericardium: Trivial pericardial effusion is present. The pericardial effusion is surrounding the apex. There is no evidence of cardiac tamponade. Mitral Valve: The mitral valve is normal in  structure. No evidence of mitral valve regurgitation. No evidence of mitral valve stenosis. Tricuspid Valve: The tricuspid valve is normal in structure. Tricuspid valve regurgitation is mild to moderate. No evidence of tricuspid stenosis. Aortic Valve: The aortic valve is tricuspid. Aortic valve regurgitation is not  visualized. Pulmonic Valve: The pulmonic valve was normal in structure. Pulmonic valve regurgitation is not visualized. No evidence of pulmonic stenosis. Aorta: The aortic root, ascending aorta and aortic arch are all structurally normal, with no evidence of dilitation or obstruction. IAS/Shunts: Cannot exclude a small PFO.  LEFT VENTRICLE PLAX 2D LVIDd:         4.50 cm   Diastology LVIDs:         3.00 cm   LV e' lateral: 12.00 cm/s LV PW:         1.10 cm LV IVS:        1.10 cm LVOT diam:     2.20 cm LV SV:         58 LV SV Index:   29 LVOT Area:     3.80 cm  RIGHT VENTRICLE             IVC RV Basal diam:  2.60 cm     IVC diam: 1.60 cm RV S prime:     19.60 cm/s TAPSE (M-mode): 2.0 cm LEFT ATRIUM             Index        RIGHT ATRIUM           Index LA diam:        3.60 cm 1.78 cm/m   RA Area:     11.50 cm LA Vol (A2C):   61.0 ml 30.18 ml/m  RA Volume:   25.70 ml  12.72 ml/m LA Vol (A4C):   51.2 ml 25.33 ml/m LA Biplane Vol: 59.2 ml 29.29 ml/m  AORTIC VALVE LVOT Vmax:   109.00 cm/s LVOT Vmean:  73.400 cm/s LVOT VTI:    0.153 m  AORTA Ao Root diam: 2.80 cm Ao Asc diam:  2.80 cm TRICUSPID VALVE TR Peak grad:   29.6 mmHg TR Vmax:        272.00 cm/s  SHUNTS Systemic VTI:  0.15 m Systemic Diam: 2.20 cm Riley Lam MD Electronically signed by Riley Lam MD Signature Date/Time: 05/24/2023/4:08:21 PM    Final    CT Angio Chest PE W and/or Wo Contrast  Result Date: 05/23/2023 CLINICAL DATA:  Tachycardia EXAM: CT ANGIOGRAPHY CHEST WITH CONTRAST TECHNIQUE: Multidetector CT imaging of the chest was performed using the standard protocol during bolus administration of intravenous  contrast. Multiplanar CT image reconstructions and MIPs were obtained to evaluate the vascular anatomy. RADIATION DOSE REDUCTION: This exam was performed according to the departmental dose-optimization program which includes automated exposure control, adjustment of the mA and/or kV according to patient size and/or use of iterative reconstruction technique. CONTRAST:  75mL OMNIPAQUE IOHEXOL 350 MG/ML SOLN COMPARISON:  None Available. FINDINGS: Cardiovascular: Satisfactory opacification of the pulmonary arteries to the segmental level. No evidence of pulmonary embolism. Normal cardiac size. No pericardial effusion. Nonaneurysmal aorta Mediastinum/Nodes: Midline trachea. No thyroid mass. Mildly enlarged bilateral axillary lymph nodes, on the right measuring up to 14 mm and on the left measuring up to 11 mm. Esophagus within normal limits. Lungs/Pleura: Small right-sided pleural effusion. Partial consolidations in the right greater than left lower lobes. Mild right middle lobe consolidation. Upper Abdomen: No acute finding Musculoskeletal: No acute osseous abnormality Review of the MIP images confirms the above findings. IMPRESSION: 1. Negative for acute pulmonary embolus. 2. Small right-sided pleural effusion. Partial consolidations in the right greater than left lower lobes and right middle lobe, suspicious for pneumonia. 3. Mildly enlarged bilateral axillary lymph nodes, nonspecific, possibly reactive. Electronically Signed  By: Jasmine Pang M.D.   On: 05/23/2023 23:15     TODAY-DAY OF DISCHARGE:  Subjective:   Pamela Erickson today has no headache,no chest abdominal pain,no new weakness tingling or numbness, feels much better wants to go home today.   Objective:   Blood pressure (!) 130/91, pulse (!) 104, temperature 98.5 F (36.9 C), temperature source Oral, resp. rate 18, height 5\' 2"  (1.575 m), weight 103.4 kg, SpO2 91%, currently breastfeeding.  Intake/Output Summary (Last 24 hours) at 05/29/2023  0827 Last data filed at 05/28/2023 1300 Gross per 24 hour  Intake 240 ml  Output --  Net 240 ml   Filed Weights   05/23/23 2010  Weight: 103.4 kg    Exam: Awake Alert, Oriented *3, No new F.N deficits, Normal affect [place].AT,PERRAL Supple Neck,No JVD, No cervical lymphadenopathy appriciated.  Symmetrical Chest wall movement, Good air movement bilaterally, CTAB RRR,No Gallops,Rubs or new Murmurs, No Parasternal Heave +ve B.Sounds, Abd Soft, Non tender, No organomegaly appriciated, No rebound -guarding or rigidity. No Cyanosis, Clubbing or edema, No new Rash or bruise   PERTINENT RADIOLOGIC STUDIES: No results found.   PERTINENT LAB RESULTS: CBC: Recent Labs    05/27/23 0346 05/28/23 0459  WBC 7.8 5.2  HGB 9.7* 10.2*  HCT 30.4* 32.4*  PLT 533* 582*   CMET CMP     Component Value Date/Time   NA 136 05/28/2023 0459   NA 139 02/17/2020 1528   K 3.6 05/28/2023 0459   CL 104 05/28/2023 0459   CO2 24 05/28/2023 0459   GLUCOSE 92 05/28/2023 0459   BUN 5 (L) 05/28/2023 0459   BUN 9 02/17/2020 1528   CREATININE 0.76 05/28/2023 0459   CALCIUM 8.7 (L) 05/28/2023 0459   PROT 8.2 (H) 05/24/2023 0005   PROT 8.4 02/17/2020 1528   ALBUMIN 3.1 (L) 05/24/2023 0005   ALBUMIN 4.1 02/17/2020 1528   AST 24 05/24/2023 0005   ALT 17 05/24/2023 0005   ALKPHOS 93 05/24/2023 0005   BILITOT 0.8 05/24/2023 0005   BILITOT 0.3 02/17/2020 1528   GFRNONAA >60 05/28/2023 0459    GFR Estimated Creatinine Clearance: 113.8 mL/min (by C-G formula based on SCr of 0.76 mg/dL). No results for input(s): "LIPASE", "AMYLASE" in the last 72 hours. No results for input(s): "CKTOTAL", "CKMB", "CKMBINDEX", "TROPONINI" in the last 72 hours. Invalid input(s): "POCBNP" No results for input(s): "DDIMER" in the last 72 hours. No results for input(s): "HGBA1C" in the last 72 hours. No results for input(s): "CHOL", "HDL", "LDLCALC", "TRIG", "CHOLHDL", "LDLDIRECT" in the last 72 hours. No results for  input(s): "TSH", "T4TOTAL", "T3FREE", "THYROIDAB" in the last 72 hours.  Invalid input(s): "FREET3" No results for input(s): "VITAMINB12", "FOLATE", "FERRITIN", "TIBC", "IRON", "RETICCTPCT" in the last 72 hours. Coags: No results for input(s): "INR" in the last 72 hours.  Invalid input(s): "PT" Microbiology: Recent Results (from the past 240 hour(s))  Blood culture (routine x 2)     Status: None   Collection Time: 05/23/23  8:20 PM   Specimen: BLOOD  Result Value Ref Range Status   Specimen Description BLOOD SITE NOT SPECIFIED  Final   Special Requests   Final    BOTTLES DRAWN AEROBIC AND ANAEROBIC Blood Culture adequate volume   Culture   Final    NO GROWTH 5 DAYS Performed at [facility], [address] [place], [place] [address]    Report Status 05/29/2023 FINAL  Final  Blood culture (routine x 2)     Status:  None   Collection Time: 05/23/23  8:24 PM   Specimen: BLOOD  Result Value Ref Range Status   Specimen Description BLOOD SITE NOT SPECIFIED  Final   Special Requests   Final    BOTTLES DRAWN AEROBIC AND ANAEROBIC Blood Culture adequate volume   Culture   Final    NO GROWTH 5 DAYS Performed at [facility], [address], [place], [place] [address]    Report Status 05/29/2023 FINAL  Final  Respiratory (~20 pathogens) panel by PCR     Status: None   Collection Time: 05/24/23  8:04 AM   Specimen: Nasopharyngeal Swab; Respiratory  Result Value Ref Range Status   Adenovirus NOT DETECTED NOT DETECTED Final   Coronavirus 229E NOT DETECTED NOT DETECTED Final    Comment: (NOTE) The Coronavirus on the Respiratory Panel, DOES NOT test for the novel  Coronavirus (2019 nCoV)    Coronavirus HKU1 NOT DETECTED NOT DETECTED Final   Coronavirus NL63 NOT DETECTED NOT DETECTED Final   Coronavirus OC43 NOT DETECTED NOT DETECTED Final   Metapneumovirus NOT DETECTED NOT DETECTED Final   Rhinovirus / Enterovirus NOT DETECTED NOT DETECTED Final   Influenza A NOT DETECTED  NOT DETECTED Final   Influenza B NOT DETECTED NOT DETECTED Final   Parainfluenza Virus 1 NOT DETECTED NOT DETECTED Final   Parainfluenza Virus 2 NOT DETECTED NOT DETECTED Final   Parainfluenza Virus 3 NOT DETECTED NOT DETECTED Final   Parainfluenza Virus 4 NOT DETECTED NOT DETECTED Final   Respiratory Syncytial Virus NOT DETECTED NOT DETECTED Final   Bordetella pertussis NOT DETECTED NOT DETECTED Final   Bordetella Parapertussis NOT DETECTED NOT DETECTED Final   Chlamydophila pneumoniae NOT DETECTED NOT DETECTED Final   Mycoplasma pneumoniae NOT DETECTED NOT DETECTED Final    Comment: Performed at [facility], [address], [place], [place] [address]  SARS Coronavirus 2 by RT PCR (hospital order, performed in [facility] hospital lab) *cepheid single result test* Anterior Nasal Swab     Status: None   Collection Time: 05/24/23 10:22 AM   Specimen: Anterior Nasal Swab  Result Value Ref Range Status   SARS Coronavirus 2 by RT PCR NEGATIVE NEGATIVE Final    Comment: Performed at [facility], [address], [place], [place] [address]    FURTHER DISCHARGE INSTRUCTIONS:  Get Medicines reviewed and adjusted: Please take all your medications with you for your next visit with your Primary MD  Laboratory/radiological data: Please request your Primary MD to go over all hospital tests and procedure/radiological results at the follow up, please ask your Primary MD to get all Hospital records sent to his/her office.  In some cases, they will be blood work, cultures and biopsy results pending at the time of your discharge. Please request that your primary care M.D. goes through all the records of your hospital data and follows up on these results.  Also Note the following: If you experience worsening of your admission symptoms, develop shortness of breath, life threatening emergency, suicidal or homicidal thoughts you must seek medical attention immediately by calling 911 or calling  your MD immediately  if symptoms less severe.  You must read complete instructions/literature along with all the possible adverse reactions/side effects for all the Medicines you take and that have been prescribed to you. Take any new Medicines after you have completely understood and accpet all the possible adverse reactions/side effects.   Do not drive when taking Pain medications or sleeping  medications (Benzodaizepines)  Do not take more than prescribed Pain, Sleep and Anxiety Medications. It is not advisable to combine anxiety,sleep and pain medications without talking with your primary care practitioner  Special Instructions: If you have smoked or chewed Tobacco  in the last 2 yrs please stop smoking, stop any regular Alcohol  and or any Recreational drug use.  Wear Seat belts while driving.  Please note: You were cared for by a hospitalist during your hospital stay. Once you are discharged, your primary care physician will handle any further medical issues. Please note that NO REFILLS for any discharge medications will be authorized once you are discharged, as it is imperative that you return to your primary care physician (or establish a relationship with a primary care physician if you do not have one) for your post hospital discharge needs so that they can reassess your need for medications and monitor your lab values.  Total Time spent coordinating discharge including counseling, education and face to face time equals greater than 30 minutes.  SignedJeoffrey Massed 05/29/2023 8:27 AM

## 2023-06-26 ENCOUNTER — Encounter: Payer: Self-pay | Admitting: Family Medicine

## 2023-06-26 ENCOUNTER — Ambulatory Visit: Payer: Medicaid Other | Admitting: Family Medicine

## 2023-06-26 VITALS — BP 120/76 | HR 87 | Temp 98.7°F | Ht 62.0 in | Wt 221.0 lb

## 2023-06-26 DIAGNOSIS — E66813 Obesity, class 3: Secondary | ICD-10-CM

## 2023-06-26 DIAGNOSIS — Z6841 Body Mass Index (BMI) 40.0 and over, adult: Secondary | ICD-10-CM | POA: Diagnosis not present

## 2023-06-26 DIAGNOSIS — M3500 Sicca syndrome, unspecified: Secondary | ICD-10-CM | POA: Diagnosis not present

## 2023-06-26 DIAGNOSIS — R109 Unspecified abdominal pain: Secondary | ICD-10-CM

## 2023-06-26 NOTE — Progress Notes (Signed)
I,Pamela Erickson, CMA,acting as a Neurosurgeon for [employer], NP.,have documented all relevant documentation on the behalf of Pamela Hose, NP,as directed by  Pamela Hose, NP while in the presence of Pamela Hose, NP.  Subjective:  Patient ID: Pamela Erickson , female    DOB: 1990/08/20 , 32 y.o.   MRN: [id number]  Chief Complaint  Patient presents with   ER f/u    HPI  Patient presents today for right sided pain that she rates 5/10. Patient she was admitted [facility] on 05/23/23 and discharged on 05/29/23  for sepsis due to pneumonia. Patient states that she has been feeling great since her discharge home but all of a sudden in the past 48 hours , her right side is hurting seriously so she can get checked out before it gets bad like the last time.   Patient denies any cough, fever or body aches.Patient also denies any shortness of breathe.Patient states that she had a baby boy four months ago and she is still breast feeding.     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   Low vitamin D level    Postpartum hemorrhage 09/17/2018   Sjogren's syndrome (HCC)    Sjogren's syndrome with keratoconjunctivitis sicca (HCC) 02/27/2018   Torn ACL (anterior cruciate ligament) 2008   Trichomonas infection    UTI (urinary tract infection)    Vaginal Pap smear, abnormal    May 2018; biopsy was normal     Family History  Problem Relation Age of Onset   Hypertension Mother    Hypertension Father    Diabetes Maternal Aunt    Hypertension Maternal Grandmother    Hypertension Maternal Grandfather      Current Outpatient Medications:    ferrous sulfate 325 (65 FE) MG tablet, Take 1 tablet (325 mg total) by mouth 2 (two) times daily with a meal., Disp: 60  tablet, Rfl: 1   ibuprofen (ADVIL) 600 MG tablet, Take 1 tablet (600 mg total) by mouth every 6 (six) hours as needed., Disp: 30 tablet, Rfl: 0   norethindrone (MICRONOR) 0.35 MG tablet, Take 1 tablet by mouth daily., Disp: , Rfl:    Prenatal MV & Min w/FA-DHA (PRENATAL ADULT GUMMY/DHA/FA PO), Take by mouth daily., Disp: , Rfl:    valACYclovir (VALTREX) 500 MG tablet, Take 500 mg by mouth as needed., Disp: , Rfl:    Allergies  Allergen Reactions   Cephalexin     Patient was diagnosed with a disease process, and is not allergic to this medication.      Review of Systems  Constitutional: Negative.   HENT: Negative.    Respiratory: Negative.    Cardiovascular: Negative.   Genitourinary:  Positive for flank pain.  Skin: Negative.   Psychiatric/Behavioral: Negative.       Today's Vitals   06/26/23 1431  BP: 120/76  Pulse: 87  Temp: 98.7 F (37.1 C)  TempSrc: Oral  SpO2: 96%  Weight: 221 lb (100.2 kg)  Height: 5\' 2"  (1.575 m)  PainSc: 5    Body mass index is 40.42 kg/m.  Wt Readings from Last 3 Encounters:  06/26/23 221 lb (100.2 kg)  05/23/23 228 lb (103.4 kg)  02/23/23 238 lb 6.4 oz (108.1 kg)    The ASCVD Risk score (Arnett DK, et al., 2019) failed to calculate for the following reasons:   The 2019 ASCVD risk score is only valid for ages 75 to 66  Objective:  Physical Exam HENT:     Head: Normocephalic.  Cardiovascular:     Rate and Rhythm: Normal rate.  Pulmonary:     Effort: Pulmonary effort is normal.  Abdominal:     General: Bowel sounds are normal.  Musculoskeletal:        General: Tenderness present.  Neurological:     Mental Status: She is alert and oriented to person, place, and time.         Assessment And Plan:  Flank pain -     DG Chest 2 View; Future  Sjogren's syndrome, with unspecified organ involvement [facility]) Assessment & Plan: Followed by Rheumatology.   Class 3 severe obesity due to excess calories without serious comorbidity with  body mass index (BMI) of 40.0 to 44.9 in adult [facility]) Assessment & Plan: She is encouraged to strive for BMI less than 30 to decrease cardiac risk. Advised to aim for at least 150 minutes of exercise per week.      Return in 2 months (on 08/27/2023), or if symptoms worsen or fail to improve, for physical with Enrigue Catena DNP.  Patient was given opportunity to ask questions. Patient verbalized understanding of the plan and was able to repeat key elements of the plan. All questions were answered to their satisfaction.    I, Pamela Hose, NP, have reviewed all documentation for this visit. The documentation on 06/26/23 for the exam, diagnosis, procedures, and orders are all accurate and complete.   IF YOU HAVE BEEN REFERRED TO A SPECIALIST, IT MAY TAKE 1-2 WEEKS TO SCHEDULE/PROCESS THE REFERRAL. IF YOU HAVE NOT HEARD FROM US/SPECIALIST IN TWO WEEKS, PLEASE GIVE [place] A CALL AT [phone].

## 2023-06-26 NOTE — Assessment & Plan Note (Signed)
 She is encouraged to strive for BMI less than 30 to decrease cardiac risk. Advised to aim for at least 150 minutes of exercise per week.

## 2023-06-26 NOTE — Assessment & Plan Note (Signed)
Followed by Rheumatology

## 2023-06-27 ENCOUNTER — Ambulatory Visit
Admission: RE | Admit: 2023-06-27 | Discharge: 2023-06-27 | Disposition: A | Discharge status: Home / Self Care | Payer: Medicaid Other | Source: Ambulatory Visit | Attending: Family Medicine | Admitting: Family Medicine

## 2023-06-27 DIAGNOSIS — J9811 Atelectasis: Secondary | ICD-10-CM | POA: Diagnosis not present

## 2023-06-27 DIAGNOSIS — R059 Cough, unspecified: Secondary | ICD-10-CM | POA: Diagnosis not present

## 2023-06-27 DIAGNOSIS — J90 Pleural effusion, not elsewhere classified: Secondary | ICD-10-CM | POA: Diagnosis not present

## 2023-06-27 DIAGNOSIS — R109 Unspecified abdominal pain: Secondary | ICD-10-CM

## 2023-07-03 ENCOUNTER — Telehealth (HOSPITAL_COMMUNITY): Payer: Self-pay | Admitting: Nurse Practitioner

## 2023-07-03 ENCOUNTER — Ambulatory Visit (INDEPENDENT_AMBULATORY_CARE_PROVIDER_SITE_OTHER): Payer: Medicaid Other

## 2023-07-03 ENCOUNTER — Encounter (HOSPITAL_COMMUNITY): Payer: Self-pay | Admitting: Emergency Medicine

## 2023-07-03 ENCOUNTER — Ambulatory Visit (HOSPITAL_COMMUNITY)
Admission: EM | Admit: 2023-07-03 | Discharge: 2023-07-03 | Disposition: A | Discharge status: Home / Self Care | Payer: Medicaid Other | Attending: Nurse Practitioner | Admitting: Nurse Practitioner

## 2023-07-03 DIAGNOSIS — R051 Acute cough: Secondary | ICD-10-CM | POA: Diagnosis not present

## 2023-07-03 DIAGNOSIS — J189 Pneumonia, unspecified organism: Secondary | ICD-10-CM

## 2023-07-03 MED ORDER — CEFPODOXIME PROXETIL 200 MG PO TABS: 200.0000 mg | ORAL_TABLET | Freq: Two times a day (BID) | ORAL | 0 refills | Status: DC

## 2023-07-03 MED ORDER — DOXYCYCLINE HYCLATE 100 MG PO CAPS: 100.0000 mg | ORAL_CAPSULE | Freq: Two times a day (BID) | ORAL | 0 refills | Status: DC

## 2023-07-03 MED ORDER — CEFUROXIME AXETIL 500 MG PO TABS: 500.0000 mg | ORAL_TABLET | Freq: Two times a day (BID) | ORAL | 0 refills | Status: AC

## 2023-07-03 NOTE — ED Provider Notes (Signed)
[facility]    CSN: [id number] Arrival date & time: 07/03/23  1348      History   Chief Complaint Chief Complaint  Patient presents with   Cough    HPI Pamela Erickson is a 32 y.o. female.   Patient presents today with approximately 10-day history of cough.  Reports last night, she had a fever up to 102 F.  She has pain in the right side of her chest and back when she coughs, but no pain with breathing in and out.  She was diagnosed with pneumonia 05/23/2023 and was hospitalized for a couple of days for treatment of that.  Reports symptoms initially improved all the way, but then returned about 10 days ago.  She endorses sore throat, however no headache or ear pain.  No abdominal pain, nausea/vomiting, or diarrhea.  Patient is currently breast-feeding.  Has taken Tylenol for symptoms.    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   Low vitamin D level    Postpartum hemorrhage 09/17/2018   Sjogren's syndrome (HCC)    Sjogren's syndrome with keratoconjunctivitis sicca (HCC) 02/27/2018   Torn ACL (anterior cruciate ligament) 2008   Trichomonas infection    UTI (urinary tract infection)    Vaginal Pap smear, abnormal    May 2018; biopsy was normal    Patient Active Problem List   Diagnosis Date Noted   Class 3 severe obesity due to excess calories without serious comorbidity with body mass index (BMI) of 40.0 to 44.9 in adult (HCC) 06/26/2023   Flank pain 06/26/2023   Breast feeding status of mother 05/24/2023   Microcytic anemia 05/24/2023   Elevated troponin 05/24/2023   Acute respiratory failure with hypoxia (HCC) 05/24/2023   Sepsis due to pneumonia (HCC) 05/23/2023   History of IUFD 10/29/2022   Obesity (BMI 35.0-39.9  without comorbidity) 04/20/2020   Sjogren's syndrome (HCC) 02/27/2018   Sickle cell trait (HCC) 08/01/2017   History of cesarean section 07/20/2017    Past Surgical History:  Procedure Laterality Date   ANTERIOR CRUCIATE LIGAMENT REPAIR  2008   CESAREAN SECTION N/A 04/25/2013   Procedure: CESAREAN SECTION;  Surgeon: Michael Litter, MD;  Location: [facility];  Service: Obstetrics;  Laterality: N/A;   COLPOSCOPY     endocervical curettage  2018    OB History     Gravida  4   Para  4   Term  3   Preterm  1   AB  0   Living  3      SAB  0   IAB  0   Ectopic  0   Multiple  0   Live Births  3            Home Medications    Prior to Admission medications   Medication Sig Start Date End Date Taking? Authorizing Provider  cefUROXime (CEFTIN) 500 MG tablet Take 1 tablet (500 mg total) by mouth 2 (two) times daily with a meal for 5 days. 07/03/23 07/08/23 Yes Valentino Nose, NP  ferrous sulfate 325 (65 FE) MG tablet Take 1 tablet (325 mg total) by mouth 2 (two) times  daily with a meal. 05/29/23   Ghimire, Werner Lean, MD  ibuprofen (ADVIL) 600 MG tablet Take 1 tablet (600 mg total) by mouth every 6 (six) hours as needed. 05/21/23   Wallis Bamberg, PA-C  norethindrone (MICRONOR) 0.35 MG tablet Take 1 tablet by mouth daily. 04/09/23   [provider]  Prenatal MV & Min w/FA-DHA (PRENATAL ADULT GUMMY/DHA/FA PO) Take by mouth daily.    [provider]  valACYclovir (VALTREX) 500 MG tablet Take 500 mg by mouth as needed. 05/20/23   [provider]    Family History Family History  Problem Relation Age of Onset   Hypertension Mother    Hypertension Father    Diabetes Maternal Aunt    Hypertension Maternal Grandmother    Hypertension Maternal Grandfather     Social History Social History   Tobacco Use   Smoking status: Never   Smokeless tobacco: Never  Vaping Use   Vaping status: Never Used  Substance Use Topics   Alcohol use: Not  Currently    Comment: OCC   Drug use: Not Currently    Types: Marijuana    Comment: OCC LAST USE 06/2012     Allergies   Patient has no active allergies.   Review of Systems Review of Systems Per HPI  Physical Exam Triage Vital Signs ED Triage Vitals  Encounter Vitals Group     BP 07/03/23 1405 126/87     Systolic BP Percentile --      Diastolic BP Percentile --      Pulse Rate 07/03/23 1405 (!) 117     Resp 07/03/23 1405 17     Temp 07/03/23 1405 99.1 F (37.3 C)     Temp Source 07/03/23 1405 Oral     SpO2 07/03/23 1405 95 %     Weight --      Height --      Head Circumference --      Peak Flow --      Pain Score 07/03/23 1404 8     Pain Loc --      Pain Education --      Exclude from Growth Chart --    No data found.  Updated Vital Signs BP 126/87 (BP Location: Left Arm)   Pulse (!) 117   Temp 99.1 F (37.3 C) (Oral)   Resp 17   LMP 06/20/2023   SpO2 95%   Breastfeeding Yes   Visual Acuity Right Eye Distance:   Left Eye Distance:   Bilateral Distance:    Right Eye Near:   Left Eye Near:    Bilateral Near:     Physical Exam Vitals and nursing note reviewed.  Constitutional:      General: She is not in acute distress.    Appearance: Normal appearance. She is not ill-appearing or toxic-appearing.  HENT:     Head: Normocephalic and atraumatic.     Right Ear: Tympanic membrane, ear canal and external ear normal.     Left Ear: Tympanic membrane, ear canal and external ear normal.     Nose: No congestion or rhinorrhea.     Mouth/Throat:     Mouth: Mucous membranes are moist.     Pharynx: Oropharynx is clear. No oropharyngeal exudate or posterior oropharyngeal erythema.  Eyes:     General: No scleral icterus.    Extraocular Movements: Extraocular movements intact.  Cardiovascular:     Rate and Rhythm: Normal rate and regular rhythm.  Pulmonary:  Effort: Pulmonary effort is normal. No respiratory distress.     Breath sounds: Rhonchi present.  No wheezing or rales.  Musculoskeletal:     Cervical back: Normal range of motion and neck supple.  Lymphadenopathy:     Cervical: No cervical adenopathy.  Skin:    General: Skin is warm and dry.     Coloration: Skin is not jaundiced or pale.     Findings: No erythema or rash.  Neurological:     Mental Status: She is alert and oriented to person, place, and time.  Psychiatric:        Behavior: Behavior is cooperative.      UC Treatments / Results  Labs (all labs ordered are listed, but only abnormal results are displayed) Labs Reviewed - No data to display  EKG   Radiology DG Chest 2 View Result Date: 07/03/2023 CLINICAL DATA:  Right-sided chest pain and cough, initial encounter EXAM: CHEST - 2 VIEW COMPARISON:  06/27/2023 FINDINGS: Cardiac shadow is stable. Left lung is clear. Right-sided pleural effusion and basilar atelectasis is noted. The effusion is stable from the prior exam. Atelectasis is new. No bony abnormality is noted. IMPRESSION: New right basilar atelectasis. Stable right pleural effusion is noted. Electronically Signed   By: Alcide Clever M.D.   On: 07/03/2023 15:43    Procedures Procedures (including critical care time)  Medications Ordered in UC Medications - No data to display  Initial Impression / Assessment and Plan / UC Course  I have reviewed the triage vital signs and the nursing notes.  Pertinent labs & imaging results that were available during my care of the patient were reviewed by me and considered in my medical decision making (see chart for details).   In triage, patient is mildly tachycardic, otherwise vital signs are stable.  1. Acute cough 2. Pneumonia of right lower lobe due to infectious organism Concern for ongoing pneumonia; chest x-ray shows right basilar atelectasis and stable right pleural effusion Initially, we were going to treat with Vantin but patient discovered her insurance does not cover this, so will treat with Ceftin  twice daily for 5 days Will hold off on atypical treatment at this time as she was treated for that with azithromycin while she was hospitalized Recommended close follow-up with pulmonology and contact information provided Strict ER precautions discussed in the meantime  The patient was given the opportunity to ask questions.  All questions answered to their satisfaction.  The patient is in agreement to this plan.    Final Clinical Impressions(s) / UC Diagnoses   Final diagnoses:  Acute cough  Pneumonia of right lower lobe due to infectious organism     Discharge Instructions      As we discussed, I am concerned that you have ongoing pneumonia.  Please take the Vantin as prescribed to treat pneumonia.  Also recommend plenty of hydration with water or Pedialyte, Mucinex at break up the congestion.  It is safe to breast-feed with the Vantin.  Recommend close follow-up with pulmonology to ensure full improvement of pneumonia.     ED Prescriptions     Medication Sig Dispense Auth. Provider   cefpodoxime (VANTIN) 200 MG tablet  (Status: Discontinued) Take 1 tablet (200 mg total) by mouth 2 (two) times daily for 7 days. 14 tablet Cathlean Marseilles A, NP   doxycycline (VIBRAMYCIN) 100 MG capsule  (Status: Discontinued) Take 1 capsule (100 mg total) by mouth 2 (two) times daily for 7 days. 14 capsule Cathlean Marseilles  A, NP   cefUROXime (CEFTIN) 500 MG tablet Take 1 tablet (500 mg total) by mouth 2 (two) times daily with a meal for 5 days. 10 tablet Valentino Nose, NP      PDMP not reviewed this encounter.   Valentino Nose, NP 07/03/23 [phone]

## 2023-07-03 NOTE — ED Notes (Signed)
Called pharmacy to d/c doxycyline per Cathlean Marseilles, NP

## 2023-07-03 NOTE — Discharge Instructions (Addendum)
As we discussed, I am concerned that you have ongoing pneumonia.  Please take the Vantin as prescribed to treat pneumonia.  Also recommend plenty of hydration with water or Pedialyte, Mucinex at break up the congestion.  It is safe to breast-feed with the Vantin.  Recommend close follow-up with pulmonology to ensure full improvement of pneumonia.

## 2023-07-03 NOTE — ED Triage Notes (Signed)
Pt states she had pneumonia last month and was hospitalized.  She began having right side pain, cough, sore throat that began last week. She was seen by PCP and had chest x-ray but was not given any medications.

## 2023-07-03 NOTE — Telephone Encounter (Signed)
Patient called back and reported insurance is not covering Vantin.  Will prescribe Ceftin for her.  She also requested I remove the cephalexin allergy from her chart.

## 2023-07-08 NOTE — Progress Notes (Signed)
Some persistent consolidation noted in the right lung may be due to the pneumonia from before,..The patient is asked to return in about 4 -6 weeks to reevaluate and make sure it is clear.  Thanks!

## 2023-07-15 ENCOUNTER — Ambulatory Visit: Payer: Medicaid Other | Admitting: Nurse Practitioner

## 2023-07-29 DIAGNOSIS — Z8759 Personal history of other complications of pregnancy, childbirth and the puerperium: Secondary | ICD-10-CM | POA: Diagnosis not present

## 2023-07-29 DIAGNOSIS — Z79899 Other long term (current) drug therapy: Secondary | ICD-10-CM | POA: Diagnosis not present

## 2023-07-29 DIAGNOSIS — M3500 Sicca syndrome, unspecified: Secondary | ICD-10-CM | POA: Diagnosis not present

## 2023-07-29 DIAGNOSIS — R768 Other specified abnormal immunological findings in serum: Secondary | ICD-10-CM | POA: Diagnosis not present

## 2023-08-27 ENCOUNTER — Encounter: Payer: Medicaid Other | Admitting: Nurse Practitioner

## 2023-08-27 ENCOUNTER — Encounter: Payer: Self-pay | Admitting: Nurse Practitioner

## 2023-08-27 ENCOUNTER — Ambulatory Visit: Payer: Medicaid Other | Admitting: Nurse Practitioner

## 2023-08-27 VITALS — BP 110/70 | HR 86 | Temp 98.2°F | Ht 62.0 in | Wt 231.0 lb

## 2023-08-27 DIAGNOSIS — Z79899 Other long term (current) drug therapy: Secondary | ICD-10-CM

## 2023-08-27 DIAGNOSIS — Z6841 Body Mass Index (BMI) 40.0 and over, adult: Secondary | ICD-10-CM | POA: Diagnosis not present

## 2023-08-27 DIAGNOSIS — E559 Vitamin D deficiency, unspecified: Secondary | ICD-10-CM

## 2023-08-27 DIAGNOSIS — Z0000 Encounter for general adult medical examination without abnormal findings: Secondary | ICD-10-CM

## 2023-08-27 DIAGNOSIS — Z13220 Encounter for screening for lipoid disorders: Secondary | ICD-10-CM | POA: Diagnosis not present

## 2023-08-27 DIAGNOSIS — E66813 Obesity, class 3: Secondary | ICD-10-CM

## 2023-08-27 DIAGNOSIS — Z2821 Immunization not carried out because of patient refusal: Secondary | ICD-10-CM

## 2023-08-27 DIAGNOSIS — Z30011 Encounter for initial prescription of contraceptive pills: Secondary | ICD-10-CM | POA: Insufficient documentation

## 2023-08-27 NOTE — Assessment & Plan Note (Signed)
 Will check vitamin D level and supplement as needed.    Also encouraged to spend 15 minutes in the sun daily.

## 2023-08-27 NOTE — Patient Instructions (Signed)
Goal to exercise 150 minutes per week with at least 2 days of strength training Encouraged to park further when at the store, take stairs instead of elevators and to walk in place during commercials. Increase water intake to at least one gallon of water daily.

## 2023-08-27 NOTE — Assessment & Plan Note (Signed)

## 2023-08-27 NOTE — Assessment & Plan Note (Signed)
 She is encouraged to strive for BMI less than 30 to decrease cardiac risk. Advised to aim for at least 150 minutes of exercise per week. Chronic Discussed healthy diet and regular exercise options  Encouraged to exercise at least 150 minutes per week with 2 days of strength training Discussed starting Wegovy once she has stopped breast feeding and pending insurance approval she is to titrate weekly, discussed side effects of nausea, abdominal pain or difficulty swallowing to notify office. [facility] teaching will be done once she gets approved, if she returns call in a month can start if not needs an office visit to recheck weight.

## 2023-08-27 NOTE — Progress Notes (Signed)
 Madelaine Bhat, CMA,acting as a Neurosurgeon for Arnette Felts, FNP.,have documented all relevant documentation on the behalf of Arnette Felts, FNP,as directed by  Arnette Felts, FNP while in the presence of Arnette Felts, FNP.  Subjective:    Patient ID: Pamela Erickson , female    DOB: 1990-11-07 , 33 y.o.   MRN: [id number]  Chief Complaint  Patient presents with   Annual Exam    HPI  Patient presents today for HM, Patient reports compliance with medication. Patient denies any chest pain, SOB, or headaches. Patient has no concerns today. She has not seen an other providers since her last visit. She has tried phentermine in the past without good results.      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   Low vitamin D level    Postpartum hemorrhage 09/17/2018   Sjogren's syndrome (HCC)    Sjogren's syndrome with keratoconjunctivitis sicca (HCC) 02/27/2018   Torn ACL (anterior cruciate ligament) 2008   Trichomonas infection    UTI (urinary tract infection)    Vaginal Pap smear, abnormal    May 2018; biopsy was normal     Family History  Problem Relation Age of Onset   Hypertension Mother    Hypertension Father    Diabetes Maternal Aunt    Hypertension Maternal Grandmother    Hypertension Maternal Grandfather      Current Outpatient Medications:    ferrous sulfate 325 (65 FE) MG tablet, Take 1 tablet (325 mg total) by mouth 2 (two) times daily with a meal., Disp: 60 tablet, Rfl: 1   ibuprofen (ADVIL) 600 MG tablet, Take 1 tablet (600 mg total) by mouth every 6 (six) hours as needed., Disp: 30 tablet, Rfl: 0   Prenatal MV & Min w/FA-DHA (PRENATAL ADULT GUMMY/DHA/FA PO), Take by mouth daily., Disp: , Rfl:    valACYclovir (VALTREX) 500 MG tablet, Take  500 mg by mouth as needed., Disp: , Rfl:    norethindrone (MICRONOR) 0.35 MG tablet, Take 1 tablet by mouth daily. (Patient not taking: Reported on 08/27/2023), Disp: , Rfl:    No Active Allergies    The patient states she uses none for birth control. Patient's last menstrual period was 08/27/2023.. Negative for Dysmenorrhea and Negative for Menorrhagia. Negative for: breast discharge, breast lump(s), breast pain and breast self exam. Associated symptoms include abnormal vaginal bleeding. Pertinent negatives include abnormal bleeding (hematology), anxiety, decreased libido, depression, difficulty falling sleep, dyspareunia, history of infertility, nocturia, sexual dysfunction, sleep disturbances, urinary incontinence, urinary urgency, vaginal discharge and vaginal itching. Diet regular; admits it is all over the place and recently stopped drinking sodas. She is making small changes. The patient states her exercise level is none - admits she needs to restart exercising - balancing. She is at the end of breast feeding  The patient's tobacco use is:  Social History   Tobacco Use  Smoking Status Never  Smokeless Tobacco Never   She has been exposed to passive smoke. The patient's alcohol use is:  Social History   Substance and Sexual Activity  Alcohol Use Not Currently   Comment: OCC   Additional information: Last pap 12/13/2021, next one scheduled  for 12/13/2024.    Review of Systems  Constitutional: Negative.   HENT: Negative.    Eyes: Negative.   Respiratory: Negative.    Cardiovascular: Negative.   Gastrointestinal: Negative.   Endocrine: Negative.   Genitourinary: Negative.   Musculoskeletal: Negative.   Skin: Negative.   Allergic/Immunologic: Negative.   Neurological: Negative.   Hematological: Negative.   Psychiatric/Behavioral: Negative.       Today's Vitals   08/27/23 0933  BP: 110/70  Pulse: 86  Temp: 98.2 F (36.8 C)  TempSrc: Oral  Weight: 231 lb (104.8 kg)   Height: 5\' 2"  (1.575 m)  PainSc: 0-No pain   Body mass index is 42.25 kg/m.  Wt Readings from Last 3 Encounters:  08/27/23 231 lb (104.8 kg)  06/26/23 221 lb (100.2 kg)  05/23/23 228 lb (103.4 kg)     Objective:  Physical Exam Constitutional:      General: She is not in acute distress.    Appearance: Normal appearance. She is well-developed. She is obese.  HENT:     Head: Normocephalic and atraumatic.     Right Ear: Hearing, tympanic membrane, ear canal and external ear normal. There is no impacted cerumen.     Left Ear: Hearing, tympanic membrane, ear canal and external ear normal. There is no impacted cerumen.     Nose: Nose normal.     Mouth/Throat:     Mouth: Mucous membranes are moist.  Eyes:     General: Lids are normal.     Extraocular Movements: Extraocular movements intact.     Conjunctiva/sclera: Conjunctivae normal.     Pupils: Pupils are equal, round, and reactive to light.     Funduscopic exam:    Right eye: No papilledema.        Left eye: No papilledema.  Neck:     Thyroid: No thyroid mass.     Vascular: No carotid bruit.  Cardiovascular:     Rate and Rhythm: Normal rate and regular rhythm.     Pulses: Normal pulses.     Heart sounds: Normal heart sounds. No murmur heard. Pulmonary:     Effort: Pulmonary effort is normal.     Breath sounds: Normal breath sounds.  Abdominal:     General: Abdomen is flat. Bowel sounds are normal. There is no distension.     Palpations: Abdomen is soft.     Tenderness: There is no abdominal tenderness.  Genitourinary:    Rectum: Guaiac result negative.  Musculoskeletal:        General: Normal range of motion.     Cervical back: Full passive range of motion without pain, normal range of motion and neck supple.  Skin:    General: Skin is warm and dry.     Capillary Refill: Capillary refill takes less than 2 seconds.  Neurological:     General: No focal deficit present.     Mental Status: She is alert and oriented to  person, place, and time.     Cranial Nerves: No cranial nerve deficit.     Sensory: No sensory deficit.  Psychiatric:        Mood and Affect: Mood normal.        Behavior: Behavior normal.        Thought Content: Thought content normal.        Judgment: Judgment normal.     Assessment And Plan:     Encounter for general adult medical examination w/o abnormal findings Assessment & Plan: Behavior modifications discussed  and diet history reviewed.   Pt will continue to exercise regularly and modify diet with low GI, plant based foods and decrease intake of processed foods.  Recommend intake of daily multivitamin, Vitamin D, and calcium.  Recommend monthly self breast exams for preventive screenings, as well as recommend immunizations that include influenza, TDAP    Encounter for screening for lipid disorder -     CMP14+EGFR -     Lipid panel  Tetanus, diphtheria, and acellular pertussis (Tdap) vaccination declined  Vitamin D deficiency Assessment & Plan: Will check vitamin D level and supplement as needed.    Also encouraged to spend 15 minutes in the sun daily.    Orders: -     VITAMIN D 25 Hydroxy (Vit-D Deficiency, Fractures)  Influenza vaccination declined Assessment & Plan: Patient declined influenza vaccination at this time. Patient is aware that influenza vaccine prevents illness in 70% of healthy people, and reduces hospitalizations to 30-70% in elderly. This vaccine is recommended annually. Education has been provided regarding the importance of this vaccine but patient still declined. Advised may receive this vaccine at local pharmacy or Health Dept.or vaccine clinic. Aware to provide a copy of the vaccination record if obtained from local pharmacy or Health Dept.  Pt is willing to accept risk associated with refusing vaccination.    Class 3 severe obesity due to excess calories without serious comorbidity with body mass index (BMI) of 40.0 to 44.9 in adult  [facility]) Assessment & Plan: She is encouraged to strive for BMI less than 30 to decrease cardiac risk. Advised to aim for at least 150 minutes of exercise per week. Chronic Discussed healthy diet and regular exercise options  Encouraged to exercise at least 150 minutes per week with 2 days of strength training Discussed starting Wegovy once she has stopped breast feeding and pending insurance approval she is to titrate weekly, discussed side effects of nausea, abdominal pain or difficulty swallowing to notify office. [facility] teaching will be done once she gets approved, if she returns call in a month can start if not needs an office visit to recheck weight.     Orders: -     Hemoglobin A1c  Breast feeding status of mother  Other long term (current) drug therapy -     CBC     Return for 1 year physical. Patient was given opportunity to ask questions. Patient verbalized understanding of the plan and was able to repeat key elements of the plan. All questions were answered to their satisfaction.   Arnette Felts, FNP  I, Arnette Felts, FNP, have reviewed all documentation for this visit. The documentation on 08/27/23 for the exam, diagnosis, procedures, and orders are all accurate and complete.

## 2023-08-27 NOTE — Assessment & Plan Note (Signed)
 Behavior modifications discussed and diet history reviewed.   Pt will continue to exercise regularly and modify diet with low GI, plant based foods and decrease intake of processed foods.  Recommend intake of daily multivitamin, Vitamin D, and calcium.  Recommend monthly self breast exams for preventive screenings, as well as recommend immunizations that include influenza, TDAP

## 2023-08-27 NOTE — Progress Notes (Deleted)
   Virtual Visit via Video Note  I,Jameka J Llittleton, CMA,acting as a scribe for Arnette Felts, FNP.,have documented all relevant documentation on the behalf of Arnette Felts, FNP,as directed by  Arnette Felts, FNP while in the presence of Arnette Felts, FNP.  I connected with [name] on 08/27/23 at  9:20 AM EST by a video enabled telemedicine application and verified that I am speaking with the correct person using two identifiers.  {Patient Location:[id number]::"Home"} {Provider Location:[phone]::"Home Office"}  I discussed the limitations, risks, security, and privacy concerns of performing an evaluation and management service by video and the availability of in person appointments. I also discussed with the patient that there may be a patient responsible charge related to this service. The patient expressed understanding and agreed to proceed.  Subjective: PCP: Arnette Felts, FNP  No chief complaint on file.  Patient presents today for a physical. Patient is followed by Dr..    For her GYN care. Patient denies having sob, chest pain or headaches. Patient doesn't have any questions or concerns at this time.        ROS: Per HPI  Current Outpatient Medications:    ferrous sulfate 325 (65 FE) MG tablet, Take 1 tablet (325 mg total) by mouth 2 (two) times daily with a meal., Disp: 60 tablet, Rfl: 1   ibuprofen (ADVIL) 600 MG tablet, Take 1 tablet (600 mg total) by mouth every 6 (six) hours as needed., Disp: 30 tablet, Rfl: 0   norethindrone (MICRONOR) 0.35 MG tablet, Take 1 tablet by mouth daily., Disp: , Rfl:    Prenatal MV & Min w/FA-DHA (PRENATAL ADULT GUMMY/DHA/FA PO), Take by mouth daily., Disp: , Rfl:    valACYclovir (VALTREX) 500 MG tablet, Take 500 mg by mouth as needed., Disp: , Rfl:   Observations/Objective: There were no vitals filed for this visit. Physical Exam  Assessment and Plan: Encounter for general adult medical examination w/o abnormal  findings  Vitamin D deficiency    Follow Up Instructions: No follow-ups on file.   I discussed the assessment and treatment plan with the patient. The patient was provided an opportunity to ask questions, and all were answered. The patient agreed with the plan and demonstrated an understanding of the instructions.   The patient was advised to call back or seek an in-person evaluation if the symptoms worsen or if the condition fails to improve as anticipated.  The above assessment and management plan was discussed with the patient. The patient verbalized understanding of and has agreed to the management plan.   Jeanell Sparrow, FNP, have reviewed all documentation for this visit. The documentation on 08/27/23 for the exam, diagnosis, procedures, and orders are all accurate and complete.

## 2023-08-28 ENCOUNTER — Other Ambulatory Visit: Payer: Self-pay | Admitting: Nurse Practitioner

## 2023-08-28 ENCOUNTER — Encounter: Payer: Self-pay | Admitting: Nurse Practitioner

## 2023-08-28 LAB — LIPID PANEL
Chol/HDL Ratio: 3.1 {ratio} (ref 0.0–4.4)
Cholesterol, Total: 163 mg/dL (ref 100–199)
HDL: 53 mg/dL (ref >39)
LDL Chol Calc (NIH): 102 mg/dL — ABNORMAL HIGH (ref 0–99)
Triglycerides: 35 mg/dL (ref 0–149)
VLDL Cholesterol Cal: 8 mg/dL (ref 5–40)

## 2023-08-28 LAB — CMP14+EGFR
ALT: 13 [IU]/L (ref 0–32)
AST: 20 [IU]/L (ref 0–40)
Albumin: 4 g/dL (ref 3.9–4.9)
Alkaline Phosphatase: 112 [IU]/L (ref 44–121)
BUN/Creatinine Ratio: 11 (ref 9–23)
BUN: 9 mg/dL (ref 6–20)
Bilirubin Total: 0.3 mg/dL (ref 0.0–1.2)
CO2: 21 mmol/L (ref 20–29)
Calcium: 8.8 mg/dL (ref 8.7–10.2)
Chloride: 105 mmol/L (ref 96–106)
Creatinine, Ser: 0.79 mg/dL (ref 0.57–1.00)
Globulin, Total: 4.4 g/dL (ref 1.5–4.5)
Glucose: 81 mg/dL (ref 70–99)
Potassium: 4.2 mmol/L (ref 3.5–5.2)
Sodium: 138 mmol/L (ref 134–144)
Total Protein: 8.4 g/dL (ref 6.0–8.5)
eGFR: 102 mL/min/{1.73_m2} (ref >59)

## 2023-08-28 LAB — CBC
Hematocrit: 37.4 % (ref 34.0–46.6)
Hemoglobin: 11.8 g/dL (ref 11.1–15.9)
MCH: 22.2 pg — ABNORMAL LOW (ref 26.6–33.0)
MCHC: 31.6 g/dL (ref 31.5–35.7)
MCV: 70 fL — ABNORMAL LOW (ref 79–97)
Platelets: 500 10*3/uL — ABNORMAL HIGH (ref 150–450)
RBC: 5.32 x10E6/uL — ABNORMAL HIGH (ref 3.77–5.28)
RDW: 16.1 % — ABNORMAL HIGH (ref 11.7–15.4)
WBC: 5.2 10*3/uL (ref 3.4–10.8)

## 2023-08-28 LAB — VITAMIN D 25 HYDROXY (VIT D DEFICIENCY, FRACTURES): Vit D, 25-Hydroxy: 25.8 ng/mL — ABNORMAL LOW (ref 30.0–100.0)

## 2023-08-28 LAB — HEMOGLOBIN A1C
Est. average glucose Bld gHb Est-mCnc: 111 mg/dL
Hgb A1c MFr Bld: 5.5 % (ref 4.8–5.6)

## 2023-08-28 MED ORDER — VITAMIN D (ERGOCALCIFEROL) 1.25 MG (50000 UNIT) PO CAPS: 50000.0000 [IU] | ORAL_CAPSULE | ORAL | 1 refills | Status: AC

## 2023-08-29 ENCOUNTER — Telehealth: Payer: Self-pay | Admitting: Pulmonary Disease

## 2023-08-29 ENCOUNTER — Encounter: Payer: Self-pay | Admitting: Pulmonary Disease

## 2023-08-29 ENCOUNTER — Ambulatory Visit: Payer: Medicaid Other | Admitting: Pulmonary Disease

## 2023-08-29 ENCOUNTER — Ambulatory Visit: Payer: Medicaid Other

## 2023-08-29 VITALS — BP 131/86 | HR 79 | Ht 62.0 in | Wt 230.0 lb

## 2023-08-29 DIAGNOSIS — J90 Pleural effusion, not elsewhere classified: Secondary | ICD-10-CM | POA: Diagnosis not present

## 2023-08-29 DIAGNOSIS — M3505 Sjogren syndrome with inflammatory arthritis: Secondary | ICD-10-CM

## 2023-08-29 NOTE — Patient Instructions (Addendum)
 We will schedule you for CT Chest scan to follow up on the fluid around your lung and evaluate your airways more closely due to your history of sjogren's disease.  Follow up in 2 months

## 2023-08-29 NOTE — Telephone Encounter (Signed)
 Patient has been called and rescheduled.

## 2023-08-29 NOTE — Telephone Encounter (Signed)
 PT wants to resched CT appt. TY. [phone]

## 2023-08-29 NOTE — Progress Notes (Signed)
 Synopsis: Referred in February 2025 for Pleural effusion  Subjective:   PATIENT ID: Pamela Erickson GENDER: female DOB: 1991/05/05, MRN: [id number]   HPI  Chief Complaint  Patient presents with   Consult    Pt states she was in hosptial back in 11/24 with pneumonia 6 day stay and it just seems like it never went away. Mostly heavy feeling on chest worse in the AM    Pamela Erickson is a 33 year old woman, never smoker with hisory of Sjogren's syndrome who is referred to pulmonary clinic for follow up of pneumonia and pleural effusion.  She was admitted 11/16 to 11/21 for right lower lobe pneumonia with parapneumonic effusion. She was treated with IV antibiotics then discharged on oral augmentin to complete course. She has been feeling better since admission but has some ongoing exertional dyspnea. Denies cough or wheezing. No fevers, chills or sweats.   She works in Furniture conservator/restorer. She denies joint paints or issues with her Sjogren's at this time.   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   Low vitamin D level    Postpartum hemorrhage 09/17/2018   Sjogren's syndrome (HCC)    Sjogren's syndrome with keratoconjunctivitis sicca (HCC) 02/27/2018   Torn ACL (anterior cruciate ligament) 2008   Trichomonas infection    UTI (urinary tract infection)    Vaginal Pap smear, abnormal    May 2018; biopsy was normal     Family History  Problem Relation Age of Onset   Hypertension Mother    Hypertension Father    Diabetes Maternal Aunt    Hypertension Maternal Grandmother    Hypertension Maternal Grandfather      Social History   Socioeconomic History   Marital status: Single    Spouse name: Not on file   Number of children: Not on file   Years of  education: 15+   Highest education level: Not on file  Occupational History   Occupation: LIFEGUARD    Employer: [employer]   Occupation: STUDENT  Tobacco Use   Smoking status: Never   Smokeless tobacco: Never  Vaping Use   Vaping status: Never Used  Substance and Sexual Activity   Alcohol use: Not Currently    Comment: OCC   Drug use: Not Currently    Types: Marijuana    Comment: OCC LAST USE 06/2012   Sexual activity: Yes    Partners: Male    Birth control/protection: None  Other Topics Concern   Not on file  Social History Narrative   Not on file   Social Drivers of Health   Financial Resource Strain: Low Risk  (09/14/2018)   Overall Financial Resource Strain (CARDIA)    Difficulty of Paying Living Expenses: Not hard at all  Food Insecurity: No Food Insecurity (05/24/2023)   Hunger Vital Sign    Worried About Running Out of Food in the Last Year: Never true    Ran Out of Food in the Last Year: Never true  Transportation Needs: No Transportation Needs (05/24/2023)   PRAPARE - Administrator, Civil Service (Medical): No    Lack of Transportation (Non-Medical): No  Physical Activity: Not on  file  Stress: No Stress Concern Present (09/14/2018)   [employer] of Occupational Health - Occupational Stress Questionnaire    Feeling of Stress : Not at all  Social Connections: Not on file  Intimate Partner Violence: Not At Risk (05/24/2023)   Humiliation, Afraid, Rape, and Kick questionnaire    Fear of Current or Ex-Partner: No    Emotionally Abused: No    Physically Abused: No    Sexually Abused: No     No Active Allergies   Outpatient Medications Prior to Visit  Medication Sig Dispense Refill   ferrous sulfate 325 (65 FE) MG tablet Take 1 tablet (325 mg total) by mouth 2 (two) times daily with a meal. 60 tablet 1   hydroxychloroquine (PLAQUENIL) 200 MG tablet Take 400 mg by mouth daily.     ibuprofen (ADVIL) 600 MG tablet Take 1 tablet (600 mg total) by mouth  every 6 (six) hours as needed. 30 tablet 0   norethindrone (MICRONOR) 0.35 MG tablet Take 1 tablet by mouth daily.     Prenatal MV & Min w/FA-DHA (PRENATAL ADULT GUMMY/DHA/FA PO) Take by mouth daily.     valACYclovir (VALTREX) 500 MG tablet Take 500 mg by mouth as needed.     Vitamin D, Ergocalciferol, (DRISDOL) 1.25 MG (50000 UNIT) CAPS capsule Take 1 capsule (50,000 Units total) by mouth every 7 (seven) days. 12 capsule 1   No facility-administered medications prior to visit.    Review of Systems  Constitutional:  Negative for chills, fever, malaise/fatigue and weight loss.  HENT:  Negative for congestion, sinus pain and sore throat.   Eyes: Negative.   Respiratory:  Positive for shortness of breath (with exertion). Negative for cough, hemoptysis, sputum production and wheezing.   Cardiovascular:  Negative for chest pain, palpitations, orthopnea, claudication and leg swelling.  Gastrointestinal:  Negative for abdominal pain, heartburn, nausea and vomiting.  Genitourinary: Negative.   Musculoskeletal:  Negative for joint pain and myalgias.  Skin:  Negative for rash.  Neurological:  Negative for weakness.  Endo/Heme/Allergies: Negative.   Psychiatric/Behavioral: Negative.     Objective:   Vitals:   08/29/23 1119  BP: 131/86  Pulse: 79  SpO2: 98%  Weight: 230 lb (104.3 kg)  Height: 5\' 2"  (1.575 m)    Physical Exam Constitutional:      General: She is not in acute distress.    Appearance: Normal appearance. She is obese.  Eyes:     General: No scleral icterus.    Conjunctiva/sclera: Conjunctivae normal.  Cardiovascular:     Rate and Rhythm: Normal rate and regular rhythm.  Pulmonary:     Breath sounds: No wheezing, rhonchi or rales.  Musculoskeletal:     Right lower leg: No edema.     Left lower leg: No edema.  Skin:    General: Skin is warm and dry.  Neurological:     General: No focal deficit present.     CBC    Component Value Date/Time   WBC 5.2 08/27/2023  1000   WBC 5.2 05/28/2023 0459   RBC 5.32 (H) 08/27/2023 1000   RBC 4.75 05/28/2023 0459   HGB 11.8 08/27/2023 1000   HCT 37.4 08/27/2023 1000   PLT 500 (H) 08/27/2023 1000   MCV 70 (L) 08/27/2023 1000   MCH 22.2 (L) 08/27/2023 1000   MCH 21.5 (L) 05/28/2023 0459   MCHC 31.6 08/27/2023 1000   MCHC 31.5 05/28/2023 0459   RDW 16.1 (H) 08/27/2023 1000   LYMPHSABS 1.0  05/24/2023 0005   MONOABS 1.8 (H) 05/24/2023 0005   EOSABS 0.0 05/24/2023 0005   BASOSABS 0.0 05/24/2023 0005      Latest Ref Rng & Units 08/27/2023   10:00 AM 05/28/2023    4:59 AM 05/27/2023    3:46 AM  BMP  Glucose 70 - 99 mg/dL 81  92  161   BUN 6 - 20 mg/dL 9  5  6    Creatinine 0.57 - 1.00 mg/dL 0.96  0.45  4.09   BUN/Creat Ratio 9 - 23 11     Sodium 134 - 144 mmol/L 138  136  134   Potassium 3.5 - 5.2 mmol/L 4.2  3.6  3.9   Chloride 96 - 106 mmol/L 105  104  104   CO2 20 - 29 mmol/L 21  24  21    Calcium 8.7 - 10.2 mg/dL 8.8  8.7  8.6    Chest imaging: CXR 07/03/23 Cardiac shadow is stable. Left lung is clear. Right-sided pleural effusion and basilar atelectasis is noted. The effusion is stable from the prior exam. Atelectasis is new. No bony abnormality is noted.   CTA Chest 05/23/23 1. Negative for acute pulmonary embolus. 2. Small right-sided pleural effusion. Partial consolidations in the right greater than left lower lobes and right middle lobe, suspicious for pneumonia. 3. Mildly enlarged bilateral axillary lymph nodes, nonspecific, possibly reactive.  PFT:     No data to display          Labs:  Path:  Echo:  Heart Catheterization:    Assessment & Plan:   Pleural effusion - Plan: DG Chest 2 View, CT Chest Wo Contrast  Sjogren syndrome with inflammatory arthritis (HCC) - Plan: CT Chest Wo Contrast  Discussion: Pamela Erickson is a 33 year old woman, never smoker with hisory of Sjogren's syndrome who is referred to pulmonary clinic for follow up of pneumonia and pleural  effusion.  Pneumonia - appears to have resolved on chest radiograph today - no ongoing clinical concerns for pneumonia  Right Pleural Effusion - small on chest x-ray today - Order follow up CT Chest scan to evaluate for pleural thickening and degree of effusion remaining. Will discuss need for thoracentesis.  Hx of Sjogren's Disease - will review CT chest scan for airway or parenchymal involvement. - PFTs at follow up visit  Follow up in 2 months  Melody Comas, MD [facility] Office: [phone]   Current Outpatient Medications:    ferrous sulfate 325 (65 FE) MG tablet, Take 1 tablet (325 mg total) by mouth 2 (two) times daily with a meal., Disp: 60 tablet, Rfl: 1   hydroxychloroquine (PLAQUENIL) 200 MG tablet, Take 400 mg by mouth daily., Disp: , Rfl:    ibuprofen (ADVIL) 600 MG tablet, Take 1 tablet (600 mg total) by mouth every 6 (six) hours as needed., Disp: 30 tablet, Rfl: 0   norethindrone (MICRONOR) 0.35 MG tablet, Take 1 tablet by mouth daily., Disp: , Rfl:    Prenatal MV & Min w/FA-DHA (PRENATAL ADULT GUMMY/DHA/FA PO), Take by mouth daily., Disp: , Rfl:    valACYclovir (VALTREX) 500 MG tablet, Take 500 mg by mouth as needed., Disp: , Rfl:    Vitamin D, Ergocalciferol, (DRISDOL) 1.25 MG (50000 UNIT) CAPS capsule, Take 1 capsule (50,000 Units total) by mouth every 7 (seven) days., Disp: 12 capsule, Rfl: 1

## 2023-09-02 ENCOUNTER — Institutional Professional Consult (permissible substitution): Payer: Medicaid Other | Admitting: Pulmonary Disease

## 2023-09-03 ENCOUNTER — Ambulatory Visit (HOSPITAL_COMMUNITY): Payer: Medicaid Other

## 2023-09-09 ENCOUNTER — Other Ambulatory Visit: Payer: Self-pay | Admitting: Nurse Practitioner

## 2023-09-09 ENCOUNTER — Ambulatory Visit (HOSPITAL_COMMUNITY)
Admission: RE | Admit: 2023-09-09 | Discharge: 2023-09-09 | Disposition: A | Discharge status: Home / Self Care | Payer: Medicaid Other | Source: Ambulatory Visit | Attending: Pulmonary Disease | Admitting: Pulmonary Disease

## 2023-09-09 DIAGNOSIS — M3505 Sjogren syndrome with inflammatory arthritis: Secondary | ICD-10-CM | POA: Diagnosis not present

## 2023-09-09 DIAGNOSIS — R590 Localized enlarged lymph nodes: Secondary | ICD-10-CM | POA: Diagnosis not present

## 2023-09-09 DIAGNOSIS — J929 Pleural plaque without asbestos: Secondary | ICD-10-CM | POA: Diagnosis not present

## 2023-09-09 DIAGNOSIS — J90 Pleural effusion, not elsewhere classified: Secondary | ICD-10-CM | POA: Diagnosis not present

## 2023-09-09 MED ORDER — WEGOVY 0.25 MG/0.5ML ~~LOC~~ SOAJ: 0.2500 mg | SUBCUTANEOUS | 0 refills | Status: DC

## 2023-09-18 ENCOUNTER — Telehealth: Payer: Self-pay

## 2023-09-18 NOTE — Telephone Encounter (Signed)
 PA for wegovy approved - 09/11/2023-03/09/2024 Request number - [id number]

## 2023-09-19 ENCOUNTER — Ambulatory Visit: Payer: Self-pay

## 2023-09-19 VITALS — BP 110/74 | Temp 98.1°F | Ht 62.0 in | Wt 230.0 lb

## 2023-09-19 DIAGNOSIS — E66813 Obesity, class 3: Secondary | ICD-10-CM

## 2023-09-19 NOTE — Progress Notes (Signed)
 Patient presents today for [facility] teaching. She will begin on 0.25MG  today. She will do injections once a week. Every Friday.  First injection given today. Patient aware.

## 2023-09-24 DIAGNOSIS — K136 Irritative hyperplasia of oral mucosa: Secondary | ICD-10-CM | POA: Diagnosis not present

## 2023-10-06 ENCOUNTER — Other Ambulatory Visit: Payer: Self-pay

## 2023-10-06 MED ORDER — WEGOVY 0.5 MG/0.5ML ~~LOC~~ SOAJ: 0.5000 mg | SUBCUTANEOUS | 0 refills | Status: DC

## 2023-10-27 ENCOUNTER — Other Ambulatory Visit: Payer: Self-pay

## 2023-10-27 MED ORDER — WEGOVY 1 MG/0.5ML ~~LOC~~ SOAJ: 1.0000 mg | SUBCUTANEOUS | 0 refills | Status: DC

## 2023-10-27 NOTE — Telephone Encounter (Signed)
 Yes please

## 2023-10-30 ENCOUNTER — Ambulatory Visit: Payer: Medicaid Other | Admitting: Pulmonary Disease

## 2023-10-30 ENCOUNTER — Telehealth: Payer: Self-pay

## 2023-10-30 ENCOUNTER — Other Ambulatory Visit (HOSPITAL_COMMUNITY): Payer: Self-pay

## 2023-10-30 ENCOUNTER — Encounter: Payer: Self-pay | Admitting: Pulmonary Disease

## 2023-10-30 VITALS — BP 120/81 | HR 82 | Ht 62.0 in | Wt 221.0 lb

## 2023-10-30 DIAGNOSIS — R051 Acute cough: Secondary | ICD-10-CM | POA: Diagnosis not present

## 2023-10-30 MED ORDER — FEXOFENADINE HCL 180 MG PO TABS: 180.0000 mg | ORAL_TABLET | Freq: Every day | ORAL | 2 refills | Status: AC

## 2023-10-30 MED ORDER — ALBUTEROL SULFATE HFA 108 (90 BASE) MCG/ACT IN AERS: 2.0000 | INHALATION_SPRAY | Freq: Four times a day (QID) | RESPIRATORY_TRACT | 6 refills | Status: DC | PRN

## 2023-10-30 NOTE — Telephone Encounter (Signed)
*  Pulm  Pharmacy Patient Advocate Encounter   Received notification from CoverMyMeds that prior authorization for Albuterol  Sulfate HFA 108 (90 Base)MCG/ACT aerosol  is required/requested.   Insurance verification completed.   The patient is insured through [facility] .   Per test claim:  Brand Ventolin  HFA is preferred by the insurance.  If suggested medication is appropriate, Please send in a new RX and discontinue this one. If not, please advise as to why it's not appropriate so that we may request a Prior Authorization. Please note, some preferred medications may still require a PA.  If the suggested medications have not been trialed and there are no contraindications to their use, the PA will not be submitted, as it will not be approved.

## 2023-10-30 NOTE — Patient Instructions (Signed)
 For your cough: - use albuterol  inhaler 1-2 puffs every 4-6 hours as needed - start taking fexofenadine  (allegra ) 180mg  daily for possible allergies  We will schedule you for breathing tests to get a baseline measurement of your lung function as Sjogren's can affect the lungs  You CT chest scan looks good  Follow up as needed

## 2023-10-30 NOTE — Progress Notes (Signed)
 Synopsis: Referred in February 2025 for Pleural effusion  Subjective:   PATIENT ID: Pamela Erickson GENDER: female DOB: 09-Jan-1991, MRN: [id number]   HPI  Chief Complaint  Patient presents with   Follow-up   Pamela Erickson is a 33 year old woman, never smoker with hisory of Sjogren's syndrome who returns to pulmonary clinic for follow up of pneumonia and pleural effusion.  OV 08/29/23 She was admitted 11/16 to 11/21 for right lower lobe pneumonia with parapneumonic effusion. She was treated with IV antibiotics then discharged on oral augmentin  to complete course. She has been feeling better since admission but has some ongoing exertional dyspnea. Denies cough or wheezing. No fevers, chills or sweats.   She works in Furniture conservator/restorer. She denies joint paints or issues with her Sjogren's at this time.   OV 10/30/23 She has been doing well since last visit. A cough started about a week ago. No infection noted. Denies issues with seasonal allergies. She is coughing up yellow mucous. Denies wheezing or shortness of breath. No chest tightness.  Reviewed CT Chest with patient, the pulmonary infiltrates and right effusion has cleared up nicely.  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   Low vitamin D  level    Postpartum hemorrhage 09/17/2018   Sjogren's syndrome (HCC)    Sjogren's syndrome with keratoconjunctivitis sicca (HCC) 02/27/2018   Torn ACL (anterior cruciate ligament) 2008   Trichomonas infection    UTI (urinary tract infection)    Vaginal Pap smear, abnormal    May 2018; biopsy was normal     Family History  Problem Relation Age of Onset   Hypertension Mother    Hypertension Father    Diabetes Maternal Aunt    Hypertension Maternal Grandmother     Hypertension Maternal Grandfather      Social History   Socioeconomic History   Marital status: Single    Spouse name: Not on file   Number of children: Not on file   Years of education: 15+   Highest education level: Not on file  Occupational History   Occupation: LIFEGUARD    Employer: [employer]   Occupation: STUDENT  Tobacco Use   Smoking status: Never   Smokeless tobacco: Never  Vaping Use   Vaping status: Never Used  Substance and Sexual Activity   Alcohol use: Not Currently    Comment: OCC   Drug use: Not Currently    Types: Marijuana    Comment: OCC LAST USE 06/2012   Sexual activity: Yes    Partners: Male    Birth control/protection: None  Other Topics Concern   Not on file  Social History Narrative   Not on file   Social Drivers of Health   Financial Resource Strain: Low Risk  (09/14/2018)   Overall Financial Resource Strain (CARDIA)    Difficulty of Paying Living Expenses: Not hard at all  Food Insecurity: No Food Insecurity (05/24/2023)   Hunger Vital Sign    Worried About Running Out of Food in the Last Year: Never true    Ran Out of Food in the Last Year: Never true  Transportation Needs: No Transportation Needs (05/24/2023)   PRAPARE -  Administrator, Civil Service (Medical): No    Lack of Transportation (Non-Medical): No  Physical Activity: Not on file  Stress: No Stress Concern Present (09/14/2018)   [employer] of Occupational Health - Occupational Stress Questionnaire    Feeling of Stress : Not at all  Social Connections: Not on file  Intimate Partner Violence: Not At Risk (05/24/2023)   Humiliation, Afraid, Rape, and Kick questionnaire    Fear of Current or Ex-Partner: No    Emotionally Abused: No    Physically Abused: No    Sexually Abused: No     No Active Allergies   Outpatient Medications Prior to Visit  Medication Sig Dispense Refill   ferrous sulfate  325 (65 FE) MG tablet Take 1 tablet (325 mg total) by mouth 2 (two)  times daily with a meal. 60 tablet 1   hydroxychloroquine  (PLAQUENIL ) 200 MG tablet Take 400 mg by mouth daily.     ibuprofen  (ADVIL ) 600 MG tablet Take 1 tablet (600 mg total) by mouth every 6 (six) hours as needed. 30 tablet 0   norethindrone (MICRONOR) 0.35 MG tablet Take 1 tablet by mouth daily.     Prenatal MV & Min w/FA-DHA (PRENATAL ADULT GUMMY/DHA/FA PO) Take by mouth daily.     Semaglutide -Weight Management (WEGOVY ) 1 MG/0.5ML SOAJ Inject 1 mg into the skin once a week. 2 mL 0   valACYclovir  (VALTREX ) 500 MG tablet Take 500 mg by mouth as needed.     Vitamin D , Ergocalciferol , (DRISDOL ) 1.25 MG (50000 UNIT) CAPS capsule Take 1 capsule (50,000 Units total) by mouth every 7 (seven) days. 12 capsule 1   No facility-administered medications prior to visit.    Review of Systems  Constitutional:  Negative for chills, fever, malaise/fatigue and weight loss.  HENT:  Negative for congestion, sinus pain and sore throat.   Eyes: Negative.   Respiratory:  Negative for cough, hemoptysis, sputum production, shortness of breath and wheezing.   Cardiovascular:  Negative for chest pain, palpitations, orthopnea, claudication and leg swelling.  Gastrointestinal:  Negative for abdominal pain, heartburn, nausea and vomiting.  Genitourinary: Negative.   Musculoskeletal:  Negative for joint pain and myalgias.  Skin:  Negative for rash.  Neurological:  Negative for weakness.  Endo/Heme/Allergies: Negative.   Psychiatric/Behavioral: Negative.     Objective:   Vitals:   10/30/23 1055  BP: 120/81  Pulse: 82  SpO2: 97%  Weight: 221 lb (100.2 kg)  Height: 5\' 2"  (1.575 m)    Physical Exam Constitutional:      General: She is not in acute distress.    Appearance: Normal appearance. She is obese.  Eyes:     General: No scleral icterus.    Conjunctiva/sclera: Conjunctivae normal.  Cardiovascular:     Rate and Rhythm: Normal rate and regular rhythm.  Pulmonary:     Breath sounds: No wheezing,  rhonchi or rales.  Musculoskeletal:     Right lower leg: No edema.     Left lower leg: No edema.  Skin:    General: Skin is warm and dry.  Neurological:     General: No focal deficit present.     CBC    Component Value Date/Time   WBC 5.2 08/27/2023 1000   WBC 5.2 05/28/2023 0459   RBC 5.32 (H) 08/27/2023 1000   RBC 4.75 05/28/2023 0459   HGB 11.8 08/27/2023 1000   HCT 37.4 08/27/2023 1000   PLT 500 (H) 08/27/2023 1000   MCV 70 (L) 08/27/2023  1000   MCH 22.2 (L) 08/27/2023 1000   MCH 21.5 (L) 05/28/2023 0459   MCHC 31.6 08/27/2023 1000   MCHC 31.5 05/28/2023 0459   RDW 16.1 (H) 08/27/2023 1000   LYMPHSABS 1.0 05/24/2023 0005   MONOABS 1.8 (H) 05/24/2023 0005   EOSABS 0.0 05/24/2023 0005   BASOSABS 0.0 05/24/2023 0005      Latest Ref Rng & Units 08/27/2023   10:00 AM 05/28/2023    4:59 AM 05/27/2023    3:46 AM  BMP  Glucose 70 - 99 mg/dL 81  92  161   BUN 6 - 20 mg/dL 9  5  6    Creatinine 0.57 - 1.00 mg/dL 0.96  0.45  4.09   BUN/Creat Ratio 9 - 23 11     Sodium 134 - 144 mmol/L 138  136  134   Potassium 3.5 - 5.2 mmol/L 4.2  3.6  3.9   Chloride 96 - 106 mmol/L 105  104  104   CO2 20 - 29 mmol/L 21  24  21    Calcium 8.7 - 10.2 mg/dL 8.8  8.7  8.6    Chest imaging: CXR 07/03/23 Cardiac shadow is stable. Left lung is clear. Right-sided pleural effusion and basilar atelectasis is noted. The effusion is stable from the prior exam. Atelectasis is new. No bony abnormality is noted.   CTA Chest 05/23/23 1. Negative for acute pulmonary embolus. 2. Small right-sided pleural effusion. Partial consolidations in the right greater than left lower lobes and right middle lobe, suspicious for pneumonia. 3. Mildly enlarged bilateral axillary lymph nodes, nonspecific, possibly reactive.  PFT:     No data to display          Labs:  Path:  Echo:  Heart Catheterization:    Assessment & Plan:   Acute cough - Plan: albuterol  (VENTOLIN  HFA) 108 (90 Base) MCG/ACT  inhaler, fexofenadine  (ALLEGRA  ALLERGY) 180 MG tablet  Discussion: Pamela Erickson is a 33 year old woman, never smoker with hisory of Sjogren's syndrome who returns to pulmonary clinic for follow up of pneumonia and pleural effusion.  Cough - possibly due to allergies vs reactive airways disease - start allegra  allergy tab daily - use albuterol  inhaler as needed  Pneumonia - resolution noted on CT Chest scan - no on going symptoms  Right Pleural Effusion - resolved on follow up CT chest scan - small area of pleural thickening  Hx of Sjogren's Disease - no ILD involvement at this time - PFTs to be scheduled  Follow up as needed  Duaine German, MD [facility] Office: [phone]   Current Outpatient Medications:    albuterol  (VENTOLIN  HFA) 108 (90 Base) MCG/ACT inhaler, Inhale 2 puffs into the lungs every 6 (six) hours as needed for wheezing or shortness of breath., Disp: 8 g, Rfl: 6   ferrous sulfate  325 (65 FE) MG tablet, Take 1 tablet (325 mg total) by mouth 2 (two) times daily with a meal., Disp: 60 tablet, Rfl: 1   fexofenadine  (ALLEGRA  ALLERGY) 180 MG tablet, Take 1 tablet (180 mg total) by mouth daily., Disp: 30 tablet, Rfl: 2   hydroxychloroquine  (PLAQUENIL ) 200 MG tablet, Take 400 mg by mouth daily., Disp: , Rfl:    ibuprofen  (ADVIL ) 600 MG tablet, Take 1 tablet (600 mg total) by mouth every 6 (six) hours as needed., Disp: 30 tablet, Rfl: 0   norethindrone (MICRONOR) 0.35 MG tablet, Take 1 tablet by mouth daily., Disp: , Rfl:    Prenatal  MV & Min w/FA-DHA (PRENATAL ADULT GUMMY/DHA/FA PO), Take by mouth daily., Disp: , Rfl:    Semaglutide -Weight Management (WEGOVY ) 1 MG/0.5ML SOAJ, Inject 1 mg into the skin once a week., Disp: 2 mL, Rfl: 0   valACYclovir  (VALTREX ) 500 MG tablet, Take 500 mg by mouth as needed., Disp: , Rfl:    Vitamin D , Ergocalciferol , (DRISDOL ) 1.25 MG (50000 UNIT) CAPS capsule, Take 1 capsule (50,000 Units total) by mouth every  7 (seven) days., Disp: 12 capsule, Rfl: 1

## 2023-10-31 MED ORDER — ALBUTEROL SULFATE HFA 108 (90 BASE) MCG/ACT IN AERS: 1.0000 | INHALATION_SPRAY | Freq: Four times a day (QID) | RESPIRATORY_TRACT | 6 refills | Status: AC | PRN

## 2023-10-31 NOTE — Telephone Encounter (Signed)
 Updated script sent to pharmacy for ventolin   JD

## 2023-10-31 NOTE — Addendum Note (Signed)
 Addended by: Atina Feeley on: 10/31/2023 02:51 PM   Modules accepted: Orders

## 2023-11-11 ENCOUNTER — Encounter (HOSPITAL_BASED_OUTPATIENT_CLINIC_OR_DEPARTMENT_OTHER)

## 2023-11-12 ENCOUNTER — Encounter (HOSPITAL_COMMUNITY): Payer: Self-pay

## 2023-11-18 ENCOUNTER — Ambulatory Visit (HOSPITAL_BASED_OUTPATIENT_CLINIC_OR_DEPARTMENT_OTHER): Admitting: Pulmonary Disease

## 2023-11-18 DIAGNOSIS — M3505 Sjogren syndrome with inflammatory arthritis: Secondary | ICD-10-CM

## 2023-11-18 NOTE — Progress Notes (Signed)
 Full pft performed today.

## 2023-11-18 NOTE — Progress Notes (Unsigned)
 [place], CMA,acting as a Neurosurgeon for Susanna Epley, FNP.,have documented all relevant documentation on the behalf of Susanna Epley, FNP,as directed by  Susanna Epley, FNP while in the presence of Susanna Epley, FNP.  Subjective:  Patient ID: Pamela Erickson , female    DOB: [birth year] , 33 y.o.   MRN: [id number]  No chief complaint on file.   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   Low vitamin D  level    Postpartum hemorrhage 09/17/2018   Sjogren's syndrome (HCC)    Sjogren's syndrome with keratoconjunctivitis sicca (HCC) 02/27/2018   Torn ACL (anterior cruciate ligament) 2008   Trichomonas infection    UTI (urinary tract infection)    Vaginal Pap smear, abnormal    May 2018; biopsy was normal     Family History  Problem Relation Age of Onset   Hypertension Mother    Hypertension Father    Diabetes Maternal Aunt    Hypertension Maternal Grandmother    Hypertension Maternal Grandfather      Current Outpatient Medications:    albuterol  (VENTOLIN  HFA) 108 (90 Base) MCG/ACT inhaler, Inhale 1-2 puffs into the lungs every 6 (six) hours as needed for wheezing or shortness of breath., Disp: 8 g, Rfl: 6   ferrous sulfate  325 (65 FE) MG tablet, Take 1 tablet (325 mg total) by mouth 2 (two) times daily with a meal., Disp: 60 tablet, Rfl: 1   fexofenadine  (ALLEGRA  ALLERGY) 180 MG tablet, Take 1 tablet (180 mg total) by mouth daily., Disp: 30 tablet, Rfl: 2   hydroxychloroquine  (PLAQUENIL ) 200 MG tablet, Take 400 mg by mouth daily., Disp: , Rfl:    ibuprofen  (ADVIL ) 600 MG tablet, Take 1 tablet (600 mg total) by mouth every 6 (six) hours as needed., Disp: 30 tablet, Rfl: 0   norethindrone (MICRONOR) 0.35 MG tablet, Take 1 tablet by mouth  daily., Disp: , Rfl:    Prenatal MV & Min w/FA-DHA (PRENATAL ADULT GUMMY/DHA/FA PO), Take by mouth daily., Disp: , Rfl:    Semaglutide -Weight Management (WEGOVY ) 1 MG/0.5ML SOAJ, Inject 1 mg into the skin once a week., Disp: 2 mL, Rfl: 0   valACYclovir  (VALTREX ) 500 MG tablet, Take 500 mg by mouth as needed., Disp: , Rfl:    Vitamin D , Ergocalciferol , (DRISDOL ) 1.25 MG (50000 UNIT) CAPS capsule, Take 1 capsule (50,000 Units total) by mouth every 7 (seven) days., Disp: 12 capsule, Rfl: 1   No Active Allergies   Review of Systems   There were no vitals filed for this visit. There is no height or weight on file to calculate BMI.  Wt Readings from Last 3 Encounters:  10/30/23 221 lb (100.2 kg)  09/19/23 230 lb (104.3 kg)  08/29/23 230 lb (104.3 kg)    The ASCVD Risk score (Arnett DK, et al., 2019) failed to calculate for the following reasons:   The 2019 ASCVD risk score is only valid for ages 69 to 65  Objective:  Physical Exam      Assessment And Plan:  There are no diagnoses linked to this encounter.  No follow-ups on file.  Patient was given opportunity to  ask questions. Patient verbalized understanding of the plan and was able to repeat key elements of the plan. All questions were answered to their satisfaction.    Inge Mangle, FNP, have reviewed all documentation for this visit. The documentation on 11/18/23 for the exam, diagnosis, procedures, and orders are all accurate and complete.   IF YOU HAVE BEEN REFERRED TO A SPECIALIST, IT MAY TAKE 1-2 WEEKS TO SCHEDULE/PROCESS THE REFERRAL. IF YOU HAVE NOT HEARD FROM US /SPECIALIST IN TWO WEEKS, PLEASE GIVE US  A CALL AT [phone].

## 2023-11-18 NOTE — Patient Instructions (Signed)
 Full pft performed today.

## 2023-11-19 ENCOUNTER — Encounter: Payer: Self-pay | Admitting: Nurse Practitioner

## 2023-11-19 ENCOUNTER — Ambulatory Visit: Payer: Self-pay | Admitting: Nurse Practitioner

## 2023-11-19 VITALS — BP 110/80 | HR 72 | Temp 98.2°F | Ht 62.0 in | Wt 217.2 lb

## 2023-11-19 DIAGNOSIS — L918 Other hypertrophic disorders of the skin: Secondary | ICD-10-CM | POA: Insufficient documentation

## 2023-11-19 DIAGNOSIS — E6609 Other obesity due to excess calories: Secondary | ICD-10-CM

## 2023-11-19 DIAGNOSIS — Z6839 Body mass index (BMI) 39.0-39.9, adult: Secondary | ICD-10-CM | POA: Diagnosis not present

## 2023-11-19 DIAGNOSIS — E66812 Obesity, class 2: Secondary | ICD-10-CM | POA: Insufficient documentation

## 2023-11-19 MED ORDER — SEMAGLUTIDE-WEIGHT MANAGEMENT 1.7 MG/0.75ML ~~LOC~~ SOAJ: 1.7000 mg | SUBCUTANEOUS | 1 refills | Status: DC

## 2023-11-19 NOTE — Assessment & Plan Note (Signed)
 Multiple skin tags to neck and left axilla, will refer to dermatology

## 2023-11-19 NOTE — Assessment & Plan Note (Signed)
 She has lost approximately 20 lbs since March, will increase her Wegovy  to 1.7 mg she is tolerating well. Continue exercising regularly and eating at least 3 small meals a day

## 2023-12-15 LAB — PULMONARY FUNCTION TEST
DL/VA % pred: 114 %
DL/VA: 5.31 ml/min/mmHg/L
DLCO unc % pred: 101 %
DLCO unc: 21.1 ml/min/mmHg
FEF 25-75 Post: 4.19 L/s
FEF 25-75 Pre: 3.41 L/s
FEF2575-%Change-Post: 22 %
FEF2575-%Pred-Post: 127 %
FEF2575-%Pred-Pre: 103 %
FEV1-%Change-Post: 4 %
FEV1-%Pred-Post: 92 %
FEV1-%Pred-Pre: 88 %
FEV1-Post: 2.74 L
FEV1-Pre: 2.62 L
FEV1FVC-%Change-Post: 3 %
FEV1FVC-%Pred-Pre: 105 %
FEV6-%Change-Post: 1 %
FEV6-%Pred-Post: 85 %
FEV6-%Pred-Pre: 84 %
FEV6-Post: 3 L
FEV6-Pre: 2.95 L
FEV6FVC-%Pred-Post: 100 %
FEV6FVC-%Pred-Pre: 100 %
FVC-%Change-Post: 1 %
FVC-%Pred-Post: 84 %
FVC-%Pred-Pre: 83 %
FVC-Post: 3 L
FVC-Pre: 2.95 L
Post FEV1/FVC ratio: 92 %
Post FEV6/FVC ratio: 100 %
Pre FEV1/FVC ratio: 89 %
Pre FEV6/FVC Ratio: 100 %
RV % pred: 109 %
RV: 1.46 L
TLC % pred: 96 %
TLC: 4.61 L

## 2024-01-02 ENCOUNTER — Encounter: Payer: Self-pay | Admitting: Nurse Practitioner

## 2024-01-05 ENCOUNTER — Other Ambulatory Visit: Payer: Self-pay | Admitting: Nurse Practitioner

## 2024-01-05 DIAGNOSIS — E66812 Obesity, class 2: Secondary | ICD-10-CM

## 2024-01-05 DIAGNOSIS — N760 Acute vaginitis: Secondary | ICD-10-CM | POA: Diagnosis not present

## 2024-01-05 MED ORDER — WEGOVY 2.4 MG/0.75ML ~~LOC~~ SOAJ: 2.4000 mg | SUBCUTANEOUS | 2 refills | Status: DC

## 2024-01-08 ENCOUNTER — Ambulatory Visit: Payer: Self-pay | Admitting: Pulmonary Disease

## 2024-01-22 ENCOUNTER — Ambulatory Visit: Admitting: Nurse Practitioner

## 2024-01-22 ENCOUNTER — Encounter: Payer: Self-pay | Admitting: Nurse Practitioner

## 2024-01-22 VITALS — BP 118/80 | HR 87 | Temp 98.4°F | Ht 62.0 in | Wt 205.2 lb

## 2024-01-22 DIAGNOSIS — Z6837 Body mass index (BMI) 37.0-37.9, adult: Secondary | ICD-10-CM

## 2024-01-22 DIAGNOSIS — E66812 Obesity, class 2: Secondary | ICD-10-CM

## 2024-01-22 DIAGNOSIS — Z30011 Encounter for initial prescription of contraceptive pills: Secondary | ICD-10-CM

## 2024-01-22 LAB — POCT URINE PREGNANCY: Preg Test, Ur: NEGATIVE

## 2024-01-22 NOTE — Progress Notes (Signed)
 Pamela Erickson, CMA,acting as a Neurosurgeon for Gaines Ada, FNP.,have documented all relevant documentation on the behalf of Gaines Ada, FNP,as directed by  Gaines Ada, FNP while in the presence of Gaines Ada, FNP.  Subjective:  Patient ID: Pamela Erickson , female    DOB: March 17, 1991 , 33 y.o.   MRN: [id number]  Chief Complaint  Patient presents with   Obesity    Patient presents today for a weight follow up, Patient reports compliance with medication. Patient denies any chest pain, SOB, or headaches. Patient would also like to start birth control pills, she would like to take sronyx birth control    HPI  Patient presents for weight check, has lost 12 lbs since last visit.  She is working out at [employer] 5 days a week for 40-50 minutes.  She has had decreased hunger since her Wegovy  was increased.    She eats for breakfast fruit with a protein shake, lunch is light because she is usually not hungry, a salad or snack bar, then for dinner she has meat and veggies with smaller portions that before her weight loss journey.  She is drinking 64 oz of water per day.  She would like to start on birth control, she is currently in a monogamous relationship, not using barrier protection.  LMP 12/23/23.       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   Low vitamin D  level    Postpartum hemorrhage 09/17/2018   Sjogren's syndrome (HCC)    Sjogren's syndrome with keratoconjunctivitis sicca (HCC) 02/27/2018   Torn ACL (anterior cruciate ligament) 2008   Trichomonas infection    UTI (urinary tract infection)    Vaginal Pap smear, abnormal    May 2018; biopsy was normal     Family History  Problem Relation Age of Onset   Hypertension Mother     Hypertension Father    Diabetes Maternal Aunt    Hypertension Maternal Grandmother    Hypertension Maternal Grandfather      Current Outpatient Medications:    albuterol  (VENTOLIN  HFA) 108 (90 Base) MCG/ACT inhaler, Inhale 1-2 puffs into the lungs every 6 (six) hours as needed for wheezing or shortness of breath., Disp: 8 g, Rfl: 6   ferrous sulfate  325 (65 FE) MG tablet, Take 1 tablet (325 mg total) by mouth 2 (two) times daily with a meal., Disp: 60 tablet, Rfl: 1   fexofenadine  (ALLEGRA  ALLERGY) 180 MG tablet, Take 1 tablet (180 mg total) by mouth daily., Disp: 30 tablet, Rfl: 2   hydroxychloroquine  (PLAQUENIL ) 200 MG tablet, Take 400 mg by mouth daily., Disp: , Rfl:    ibuprofen  (ADVIL ) 600 MG tablet, Take 1 tablet (600 mg total) by mouth every 6 (six) hours as needed., Disp: 30 tablet, Rfl: 0   levonorgestrel -ethinyl estradiol (SRONYX) 0.1-20 MG-MCG tablet, Take 1 tablet by mouth daily., Disp: 28 tablet, Rfl: 11   Prenatal MV & Min w/FA-DHA (PRENATAL ADULT GUMMY/DHA/FA PO), Take by mouth daily., Disp: , Rfl:    valACYclovir  (VALTREX ) 500 MG tablet, Take 500 mg by mouth as needed., Disp: , Rfl:    Vitamin D , Ergocalciferol , (DRISDOL ) 1.25 MG (50000  UNIT) CAPS capsule, Take 1 capsule (50,000 Units total) by mouth every 7 (seven) days., Disp: 12 capsule, Rfl: 1   Semaglutide -Weight Management (WEGOVY ) 2.4 MG/0.75ML SOAJ, Inject 2.4 mg into the skin once a week., Disp: 3 mL, Rfl: 2   No Active Allergies   Review of Systems  Constitutional:  Negative for chills, fatigue and fever.  Respiratory:  Negative for cough, chest tightness and shortness of breath.   Cardiovascular:  Negative for chest pain and palpitations.  Gastrointestinal:  Negative for abdominal distention, abdominal pain, constipation, diarrhea, nausea and vomiting.  Endocrine: Negative for polydipsia, polyphagia and polyuria.  Genitourinary:  Negative for frequency, vaginal bleeding, vaginal discharge and vaginal pain.   Musculoskeletal:  Negative for arthralgias and joint swelling.  Neurological:  Negative for dizziness, weakness and light-headedness.     Today's Vitals   01/22/24 1148  BP: 118/80  Pulse: 87  Temp: 98.4 F (36.9 C)  TempSrc: Oral  Weight: 205 lb 3.2 oz (93.1 kg)  Height: 5' 2 (1.575 m)  PainSc: 0-No pain   Body mass index is 37.53 kg/m.  Wt Readings from Last 3 Encounters:  01/22/24 205 lb 3.2 oz (93.1 kg)  11/19/23 217 lb 3.2 oz (98.5 kg)  10/30/23 221 lb (100.2 kg)      Objective:  Physical Exam Constitutional:      General: She is not in acute distress.    Appearance: Normal appearance. She is obese.  HENT:     Head: Normocephalic and atraumatic.  Eyes:     Pupils: Pupils are equal, round, and reactive to light.  Neck:     Vascular: No carotid bruit.  Cardiovascular:     Rate and Rhythm: Normal rate and regular rhythm.     Pulses: Normal pulses.     Heart sounds: Normal heart sounds.  Pulmonary:     Effort: Pulmonary effort is normal. No respiratory distress.     Breath sounds: Normal breath sounds.  Musculoskeletal:        General: Normal range of motion.     Cervical back: Normal range of motion.     Right lower leg: No edema.     Left lower leg: No edema.  Skin:    General: Skin is warm and dry.     Capillary Refill: Capillary refill takes less than 2 seconds.  Neurological:     General: No focal deficit present.     Mental Status: She is alert and oriented to person, place, and time.     Motor: No weakness.     Gait: Gait normal.         Assessment And Plan:  Encounter for initial prescription of contraceptive pills Assessment & Plan: Sronyx COC birth controlm started.  Patient sexually active with one partner, non smoker, no history of migraines.  Contraceptive education completed to include start date of medication, use of back up method for first 7 days, and to monitor for complications using the ACHES assessment acronym.   Orders: -      POCT urine pregnancy -     Levonorgestrel -Ethinyl Estrad; Take 1 tablet by mouth daily.  Dispense: 28 tablet; Refill: 11  Class 2 obesity with body mass index (BMI) of 37.0 to 37.9 in adult, unspecified obesity type, unspecified whether serious comorbidity present Assessment & Plan: She has lost approximately 12 lbs since her last visit. Continue Wegovy  2.4 mg weekly. Focus on protein when you have a decreased appetite.   Orders: -  Wegovy ; Inject 2.4 mg into the skin once a week.  Dispense: 3 mL; Refill: 2    Return for 2 months weight check.   Patient was given opportunity to ask questions. Patient verbalized understanding of the plan and was able to repeat key elements of the plan. All questions were answered to their satisfaction.   I have reviewed this encounter including the documentation in this note and/or discussed this patient with Delon Louder, FNP Student. I am certifying that I agree with the content of this note as the primary care nurse practitioner.  Gaines Ada, DNP, FNP-BC   I, Gaines Ada, FNP, have reviewed all documentation for this visit. The documentation on 01/22/24 for the exam, diagnosis, procedures, and orders are all accurate and complete.   IF YOU HAVE BEEN REFERRED TO A SPECIALIST, IT MAY TAKE 1-2 WEEKS TO SCHEDULE/PROCESS THE REFERRAL. IF YOU HAVE NOT HEARD FROM US /SPECIALIST IN TWO WEEKS, PLEASE GIVE US  A CALL AT [phone].

## 2024-01-22 NOTE — Patient Instructions (Addendum)
 Abdominal pain (stomach) pain (severe) Chest Pain (sharp, crushing or heaviness)  Headaches sudden severe or vomiting, dizziness or fainting, weakness or numbness in arm or leg Eye Problems - blurring vision, flashing lights or partial/complete vision loss Sudden leg pain in calf or thigh or redness.  Prioritize protein when your appetite is low.

## 2024-01-22 NOTE — Assessment & Plan Note (Signed)
 Sronyx COC birth controlm started.  Patient sexually active with one partner, non smoker, no history of migraines.  Contraceptive education completed to include start date of medication, use of back up method for first 7 days, and to monitor for complications using the ACHES assessment acronym.

## 2024-01-26 DIAGNOSIS — R768 Other specified abnormal immunological findings in serum: Secondary | ICD-10-CM | POA: Diagnosis not present

## 2024-01-26 DIAGNOSIS — M3500 Sicca syndrome, unspecified: Secondary | ICD-10-CM | POA: Diagnosis not present

## 2024-02-01 ENCOUNTER — Encounter: Payer: Self-pay | Admitting: Nurse Practitioner

## 2024-02-01 DIAGNOSIS — Z6837 Body mass index (BMI) 37.0-37.9, adult: Secondary | ICD-10-CM | POA: Insufficient documentation

## 2024-02-01 MED ORDER — LEVONORGESTREL-ETHINYL ESTRAD 0.1-20 MG-MCG PO TABS: 1.0000 | ORAL_TABLET | Freq: Every day | ORAL | 11 refills | Status: AC

## 2024-02-01 MED ORDER — WEGOVY 2.4 MG/0.75ML ~~LOC~~ SOAJ
2.4000 mg | SUBCUTANEOUS | 2 refills | Status: DC
Start: 2024-02-01 — End: 2024-03-24

## 2024-02-01 NOTE — Assessment & Plan Note (Signed)
 She has lost approximately 12 lbs since her last visit. Continue Wegovy  2.4 mg weekly. Focus on protein when you have a decreased appetite.

## 2024-03-24 ENCOUNTER — Ambulatory Visit: Admitting: Nurse Practitioner

## 2024-03-24 ENCOUNTER — Encounter: Payer: Self-pay | Admitting: Nurse Practitioner

## 2024-03-24 VITALS — BP 120/64 | HR 83 | Temp 98.8°F | Ht 63.0 in | Wt 193.0 lb

## 2024-03-24 DIAGNOSIS — Z6834 Body mass index (BMI) 34.0-34.9, adult: Secondary | ICD-10-CM | POA: Diagnosis not present

## 2024-03-24 DIAGNOSIS — E6609 Other obesity due to excess calories: Secondary | ICD-10-CM | POA: Diagnosis not present

## 2024-03-24 DIAGNOSIS — E66811 Obesity, class 1: Secondary | ICD-10-CM | POA: Diagnosis not present

## 2024-03-24 DIAGNOSIS — E66812 Obesity, class 2: Secondary | ICD-10-CM

## 2024-03-24 DIAGNOSIS — Z2821 Immunization not carried out because of patient refusal: Secondary | ICD-10-CM

## 2024-03-24 MED ORDER — WEGOVY 2.4 MG/0.75ML ~~LOC~~ SOAJ: 2.4000 mg | SUBCUTANEOUS | 1 refills | Status: DC

## 2024-03-24 NOTE — Progress Notes (Signed)
 LILLETTE Kristeen JINNY Gladis, CMA,acting as a Neurosurgeon for Gaines Ada, FNP.,have documented all relevant documentation on the behalf of Gaines Ada, FNP,as directed by  Gaines Ada, FNP while in the presence of Gaines Ada, FNP.  Subjective:  Patient ID: Pamela Erickson , female    DOB: 1991/04/14 , 33 y.o.   MRN: [id number]  Chief Complaint  Patient presents with   Obesity    Patient presents today for a weight follow up, Patient reports compliance with medication. Patient denies any chest pain, SOB, or headaches. Patient has no concerns today.    Discussed the use of AI scribe software for clinical note transcription with the patient, who gave verbal consent to proceed.  History of Present Illness Pamela Erickson is a 33 year old female who presents for a weight check and management of her weight loss medication.  She is currently on Wegovy  and requires a refill. There is concern about potential changes in insurance coverage for the medication starting in October, which may necessitate transitioning to another medication or paying out of pocket.  She exercises four to five times a week and maintains a consistent diet. She reports drinking a lot of water, especially with her workout routine. She has lost a total of 24 pounds since May, with a current weight of 193 pounds, down from 205 pounds in July.  She inquires about compounded medications available online but is skeptical about their authenticity and safety.   Continues with Wegovy  2.4 mg weekly. She is exercising 4-5 times a week. She is drinking an adequate amount of water.      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   Low vitamin D  level    Postpartum hemorrhage 09/17/2018   Sjogren's syndrome     Sjogren's syndrome with keratoconjunctivitis sicca 02/27/2018   Torn ACL (anterior cruciate ligament) 2008   Trichomonas infection    UTI (urinary tract infection)    Vaginal Pap smear, abnormal    May 2018; biopsy was normal     Family History  Problem Relation Age of Onset   Hypertension Mother    Hypertension Father    Diabetes Maternal Aunt    Hypertension Maternal Grandmother    Hypertension Maternal Grandfather      Current Outpatient Medications:    albuterol  (VENTOLIN  HFA) 108 (90 Base) MCG/ACT inhaler, Inhale 1-2 puffs into the lungs every 6 (six) hours as needed for wheezing or shortness of breath., Disp: 8 g, Rfl: 6   ferrous sulfate  325 (65 FE) MG tablet, Take 1 tablet (325 mg total) by mouth 2 (two) times daily with a meal., Disp: 60 tablet, Rfl: 1   fexofenadine  (ALLEGRA  ALLERGY) 180 MG tablet, Take 1 tablet (180 mg total) by mouth daily., Disp: 30 tablet, Rfl: 2   hydroxychloroquine  (PLAQUENIL ) 200 MG tablet, Take 400 mg by mouth daily., Disp: , Rfl:    ibuprofen  (ADVIL ) 600 MG tablet, Take 1 tablet (600 mg total) by mouth every 6 (six) hours as needed., Disp: 30 tablet, Rfl: 0   levonorgestrel -ethinyl estradiol (SRONYX) 0.1-20 MG-MCG tablet, Take 1 tablet by mouth daily., Disp: 28 tablet, Rfl: 11   Prenatal MV &  Min w/FA-DHA (PRENATAL ADULT GUMMY/DHA/FA PO), Take by mouth daily., Disp: , Rfl:    valACYclovir  (VALTREX ) 500 MG tablet, Take 500 mg by mouth as needed., Disp: , Rfl:    Vitamin D , Ergocalciferol , (DRISDOL ) 1.25 MG (50000 UNIT) CAPS capsule, Take 1 capsule (50,000 Units total) by mouth every 7 (seven) days., Disp: 12 capsule, Rfl: 1   semaglutide -weight management (WEGOVY ) 2.4 MG/0.75ML SOAJ SQ injection, Inject 2.4 mg into the skin once a week., Disp: 3 mL, Rfl: 1   No Active Allergies   Review of Systems  Constitutional: Negative.   Respiratory: Negative.    Cardiovascular: Negative.   Neurological: Negative.   Psychiatric/Behavioral: Negative.        Today's Vitals   03/24/24 1129  BP: 120/64  Pulse: 83  Temp: 98.8 F (37.1 C)  TempSrc: Oral  Weight: 193 lb (87.5 kg)  Height: 5' 3 (1.6 m)  PainSc: 0-No pain   Body mass index is 34.19 kg/m.  Wt Readings from Last 3 Encounters:  03/24/24 193 lb (87.5 kg)  01/22/24 205 lb 3.2 oz (93.1 kg)  11/19/23 217 lb 3.2 oz (98.5 kg)      Objective:  Physical Exam Vitals and nursing note reviewed.  Constitutional:      General: She is not in acute distress.    Appearance: Normal appearance. She is obese.  Cardiovascular:     Rate and Rhythm: Normal rate and regular rhythm.     Pulses: Normal pulses.     Heart sounds: Normal heart sounds. No murmur heard. Pulmonary:     Effort: Pulmonary effort is normal. No respiratory distress.     Breath sounds: Normal breath sounds. No wheezing.  Musculoskeletal:        General: Normal range of motion.  Neurological:     General: No focal deficit present.     Mental Status: She is alert and oriented to person, place, and time.     Cranial Nerves: No cranial nerve deficit.     Motor: No weakness.  Psychiatric:        Mood and Affect: Mood normal.        Behavior: Behavior normal.        Thought Content: Thought content normal.        Judgment: Judgment normal.    B` Assessment And Plan:  Class 1 obesity due to excess calories without serious comorbidity with body mass index (BMI) of 34.0 to 34.9 in adult Assessment & Plan: She continues to have good weight loss. Continue wegovy  2.4 mg weekly. Continue exercising regularly at least 150 minutes per week.  Orders: -     Wegovy ; Inject 2.4 mg into the skin once a week.  Dispense: 3 mL; Refill: 1  Influenza vaccination declined    Return for 2 months weight check.  Patient was given opportunity to ask questions. Patient verbalized understanding of the plan and was able to repeat key elements of the plan. All questions were answered to their satisfaction.   LILLETTE Gaines Ada, FNP,  have reviewed all documentation for this visit. The documentation on 03/24/24 for the exam, diagnosis, procedures, and orders are all accurate and complete.   IF YOU HAVE BEEN REFERRED TO A SPECIALIST, IT MAY TAKE 1-2 WEEKS TO SCHEDULE/PROCESS THE REFERRAL. IF YOU HAVE NOT HEARD FROM US /SPECIALIST IN TWO WEEKS, PLEASE GIVE US  A CALL AT [phone] X [phone].

## 2024-03-30 ENCOUNTER — Encounter: Payer: Self-pay | Admitting: Nurse Practitioner

## 2024-03-30 DIAGNOSIS — E66811 Obesity, class 1: Secondary | ICD-10-CM | POA: Insufficient documentation

## 2024-03-30 NOTE — Assessment & Plan Note (Signed)
 She continues to have good weight loss. Continue wegovy  2.4 mg weekly. Continue exercising regularly at least 150 minutes per week.

## 2024-03-31 ENCOUNTER — Encounter: Payer: Self-pay | Admitting: Physician Assistant

## 2024-03-31 ENCOUNTER — Ambulatory Visit: Admitting: Physician Assistant

## 2024-03-31 VITALS — HR 86

## 2024-03-31 DIAGNOSIS — L920 Granuloma annulare: Secondary | ICD-10-CM | POA: Diagnosis not present

## 2024-03-31 DIAGNOSIS — D239 Other benign neoplasm of skin, unspecified: Secondary | ICD-10-CM

## 2024-03-31 DIAGNOSIS — L089 Local infection of the skin and subcutaneous tissue, unspecified: Secondary | ICD-10-CM

## 2024-03-31 DIAGNOSIS — D2372 Other benign neoplasm of skin of left lower limb, including hip: Secondary | ICD-10-CM | POA: Diagnosis not present

## 2024-03-31 DIAGNOSIS — L918 Other hypertrophic disorders of the skin: Secondary | ICD-10-CM

## 2024-03-31 MED ORDER — CLOBETASOL PROPIONATE 0.05 % EX OINT: 1.0000 | TOPICAL_OINTMENT | Freq: Two times a day (BID) | CUTANEOUS | 2 refills | Status: AC

## 2024-03-31 NOTE — Progress Notes (Signed)
   New Patient Visit   Subjective  Pamela Erickson is a 33 y.o. female NEW PATIENT who presents for the following: skin tag/rash on arms/skin lesion on left lateral thigh.    The patient has spots, moles and lesions to be evaluated, some may be new or changing and the patient may have concern these could be cancer.   The following portions of the chart were reviewed this encounter and updated as appropriate: medications, allergies, medical history  Review of Systems:  No other skin or systemic complaints except as noted in HPI or Assessment and Plan.  Objective  Well appearing patient in no apparent distress; mood and affect are within normal limits.  A focused examination was performed of the following areas: neck. Arms, left lower leg.    Relevant exam findings are noted in the Assessment and Plan.    Assessment & Plan   INFLAMED SKIN TAGS - NECK X 2  - cryotherapy today    GRANULOMA ANNULARE - elbows/forearms  - discussed condition and treatment options  - start clobetasol  as directed    DERMATOFIBROMA -left lateral thigh  Exam: Firm pink/brown papulenodule with dimple sign.  Treatment Plan: A dermatofibroma is a benign growth possibly related to trauma, such as an insect bite, cut from shaving, or inflamed acne-type bump.  Treatment options to remove include shave or excision with resulting scar and risk of recurrence.  Since benign-appearing and not bothersome, will observe for now.     INFLAMED SKIN TAG (2) Left Anterior Neck, Right Anterior Neck Destruction of lesion - Left Anterior Neck, Right Anterior Neck Complexity: simple   Destruction method: cryotherapy   Informed consent: discussed and consent obtained   Timeout:  patient name, date of birth, surgical site, and procedure verified Lesion destroyed using liquid nitrogen: Yes   Region frozen until ice ball extended beyond lesion: Yes   Outcome: patient tolerated procedure well with no complications    Post-procedure details: wound care instructions given    GRANULOMA ANNULARE   Related Medications clobetasol  ointment (TEMOVATE ) 0.05 % Apply 1 Application topically 2 (two) times daily. Apply twice a day until clear DERMATOFIBROMA    Return if symptoms worsen or fail to improve.  I, Doyce Pan, CMA, am acting as scribe for Billijo Dilling K, PA-C.   Documentation: I have reviewed the above documentation for accuracy and completeness, and I agree with the above.  Emmajo Bennette K, PA-C

## 2024-03-31 NOTE — Patient Instructions (Addendum)

## 2024-04-16 ENCOUNTER — Telehealth: Payer: Self-pay | Admitting: *Deleted

## 2024-04-16 DIAGNOSIS — A419 Sepsis, unspecified organism: Secondary | ICD-10-CM

## 2024-04-16 NOTE — Progress Notes (Signed)
 Complex Care Management Note  Care Guide Note 04/16/2024 Name: Pamela Erickson MRN: [id number] DOB: [birth year]  [name] is a 33 y.o. year old female who sees [name], FNP for primary care. I reached out to [facility] by phone today to offer complex care management services.  [name] was given information about Complex Care Management services today including:   The Complex Care Management services include support from the care team which includes your Nurse Care Manager, Clinical Social Worker, or Pharmacist.  The Complex Care Management team is here to help remove barriers to the health concerns and goals most important to you. Complex Care Management services are voluntary, and the patient may decline or stop services at any time by request to their care team member.   Complex Care Management Consent Status: Patient agreed to services and verbal consent obtained.   Follow up plan:  Telephone appointment with complex care management team member scheduled for:  05/06/24  Encounter Outcome:  Patient Scheduled  Harlene Satterfield  [facility]  [facility], [facility] Guide  Direct Dial: [phone]  Fax [phone]

## 2024-05-06 ENCOUNTER — Telehealth: Payer: Self-pay

## 2024-05-06 NOTE — Patient Instructions (Signed)
 Pamela Erickson - I am sorry we were unable to complete our scheduled telephone appointment today. I work with [name], FNP and am calling to support your healthcare needs. Please note that I have rescheduled our call. I will call you on Friday, November 21 at 2:00 PM. I look forward to speaking with you and hope you are doing well.    Thank you,   [name] [place]  [facility], [facility] Health Nurse Care Coordinator  Direct Dial: [phone] Website: [email or web address]

## 2024-05-25 ENCOUNTER — Encounter: Payer: Self-pay | Admitting: Nurse Practitioner

## 2024-05-25 ENCOUNTER — Ambulatory Visit: Payer: Self-pay | Admitting: Nurse Practitioner

## 2024-05-25 VITALS — BP 112/70 | HR 84 | Temp 98.4°F | Ht 63.0 in | Wt 185.2 lb

## 2024-05-25 DIAGNOSIS — Z6832 Body mass index (BMI) 32.0-32.9, adult: Secondary | ICD-10-CM

## 2024-05-25 DIAGNOSIS — E6609 Other obesity due to excess calories: Secondary | ICD-10-CM | POA: Diagnosis not present

## 2024-05-25 DIAGNOSIS — E66811 Obesity, class 1: Secondary | ICD-10-CM

## 2024-05-25 MED ORDER — WEGOVY 2.4 MG/0.75ML ~~LOC~~ SOAJ: 2.4000 mg | SUBCUTANEOUS | 1 refills | Status: DC

## 2024-05-25 NOTE — Progress Notes (Signed)
 LILLETTE Kristeen JINNY Gladis, CMA,acting as a neurosurgeon for Gaines Ada, FNP.,have documented all relevant documentation on the behalf of Gaines Ada, FNP,as directed by  Gaines Ada, FNP while in the presence of Gaines Ada, FNP.  Subjective:  Patient ID: Pamela Erickson , female    DOB: 26-Jul-1990 , 33 y.o.   MRN: [id number]  Chief Complaint  Patient presents with  . Obesity    Patient presents today for a weight follow up, Patient reports compliance with medication. Patient denies any chest pain, SOB, or headaches. Patient has no concerns today. Patient reports she has about a month and half left of wegovy .     HPI  Discussed the use of AI scribe software for clinical note transcription with the patient, who gave verbal consent to proceed.  History of Present Illness Pamela Erickson is a 33 year old female who presents for a weight check.  She has experienced a weight loss of eight pounds, bringing her current weight to 185 pounds. She reports that she has been taking Wegovy  for a month. She has not started Ozempic  as she did not pick it up.  Her exercise routine had decreased during October but has resumed to four to five days a week. Her diet consists of breakfast, a light lunch, and dinner, with a decreased appetite for snacks. She notes that her appetite is not as strong as it used to be, which has reduced her snacking habits.  No concerns with her current medications.  Past Medical History:  Diagnosis Date  . Abnormal Pap smear    last pap 01/2012  . Anemia    During pregnancy  . Anemia 06/08/2013  . BV (bacterial vaginosis)   . 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 Herpes   . 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 Low vitamin D  level   . Postpartum hemorrhage 09/17/2018  . Sjogren's syndrome   . Sjogren's syndrome with keratoconjunctivitis sicca 02/27/2018  . Torn ACL (anterior cruciate ligament) 2008  . Trichomonas infection   . UTI (urinary tract  infection)   . Vaginal Pap smear, abnormal    May 2018; biopsy was normal     Family History  Problem Relation Age of Onset  . Hypertension Mother   . Hypertension Father   . Diabetes Maternal Aunt   . Hypertension Maternal Grandmother   . Hypertension Maternal Grandfather      Current Outpatient Medications:  .  albuterol  (VENTOLIN  HFA) 108 (90 Base) MCG/ACT inhaler, Inhale 1-2 puffs into the lungs every 6 (six) hours as needed for wheezing or shortness of breath., Disp: 8 g, Rfl: 6 .  clobetasol  ointment (TEMOVATE ) 0.05 %, Apply 1 Application topically 2 (two) times daily. Apply twice a day until clear, Disp: 30 g, Rfl: 2 .  fexofenadine  (ALLEGRA  ALLERGY) 180 MG tablet, Take 1 tablet (180 mg total) by mouth daily., Disp: 30 tablet, Rfl: 2 .  hydroxychloroquine  (PLAQUENIL ) 200 MG tablet, Take 400 mg by mouth daily., Disp: , Rfl:  .  ibuprofen  (ADVIL ) 600 MG tablet, Take 1 tablet (600 mg total) by mouth every 6 (six) hours as needed., Disp: 30 tablet, Rfl: 0 .  levonorgestrel -ethinyl estradiol (SRONYX) 0.1-20 MG-MCG tablet, Take 1 tablet by mouth daily., Disp: 28 tablet, Rfl: 11 .  semaglutide -weight management (WEGOVY ) 2.4 MG/0.75ML SOAJ SQ injection, Inject 2.4 mg into the skin once a week., Disp: 3 mL, Rfl: 1 .  valACYclovir  (VALTREX )  500 MG tablet, Take 500 mg by mouth as needed., Disp: , Rfl:  .  Vitamin D , Ergocalciferol , (DRISDOL ) 1.25 MG (50000 UNIT) CAPS capsule, Take 1 capsule (50,000 Units total) by mouth every 7 (seven) days., Disp: 12 capsule, Rfl: 1 .  ferrous sulfate  325 (65 FE) MG tablet, Take 1 tablet (325 mg total) by mouth 2 (two) times daily with a meal. (Patient not taking: Reported on 05/25/2024), Disp: 60 tablet, Rfl: 1 .  Prenatal MV & Min w/FA-DHA (PRENATAL ADULT GUMMY/DHA/FA PO), Take by mouth daily. (Patient not taking: Reported on 05/25/2024), Disp: , Rfl:    No Active Allergies   Review of Systems   Today's Vitals   05/25/24 1125  BP: 112/70  Pulse: 84   Temp: 98.4 F (36.9 C)  TempSrc: Oral  Weight: 185 lb 3.2 oz (84 kg)  Height: 5' 3 (1.6 m)  PainSc: 0-No pain   Body mass index is 32.81 kg/m.  Wt Readings from Last 3 Encounters:  05/25/24 185 lb 3.2 oz (84 kg)  03/24/24 193 lb (87.5 kg)  01/22/24 205 lb 3.2 oz (93.1 kg)    The ASCVD Risk score (Arnett DK, et al., 2019) failed to calculate for the following reasons:   The 2019 ASCVD risk score is only valid for ages 71 to 79  Objective:  Physical Exam      Assessment And Plan:   Assessment & Plan Class 1 obesity due to excess calories with body mass index (BMI) of 32.0 to 32.9 in adult, unspecified whether serious comorbidity present   No orders of the defined types were placed in this encounter.  Assessment and Plan Assessment & Plan Obesity, class 1 Weight decreased by 8 pounds to 185 pounds. Currently on Wegovy . Insurance coverage uncertain. Exercise routine maintained. Reduced snacking due to decreased appetite. No medication concerns. - Continue Wegovy . - Maintain exercise 4-5 days weekly. - Monitor dietary habits, especially during holidays. - Sent new Wegovy  prescription to [facility].  General Health Maintenance Discussed importance of healthy diet and exercise, especially during holidays. Emphasized avoiding overeating. - Encouraged balanced diet and regular exercise. - Advised mindful portion sizes during holidays.   Return for 2 months weight check.  Patient was given opportunity to ask questions. Patient verbalized understanding of the plan and was able to repeat key elements of the plan. All questions were answered to their satisfaction.    LILLETTE Gaines Ada, FNP, have reviewed all documentation for this visit. The documentation on 05/25/24 for the exam, diagnosis, procedures, and orders are all accurate and complete.   IF YOU HAVE BEEN REFERRED TO A SPECIALIST, IT MAY TAKE 1-2 WEEKS TO SCHEDULE/PROCESS THE REFERRAL. IF YOU HAVE NOT HEARD FROM US /SPECIALIST  IN TWO WEEKS, PLEASE GIVE US  A CALL AT [phone] X [phone].

## 2024-05-28 ENCOUNTER — Telehealth: Payer: Self-pay

## 2024-05-28 NOTE — Patient Instructions (Signed)
 Pamela Erickson - I am sorry I was unable to reach you today for our scheduled appointment. I work with [name], FNP and am calling to support your healthcare needs.   Your next care management appointment is by telephone on Thursday, December 11 at 11:15 AM  Please call the care guide team at [phone] if you need to cancel, schedule, or reschedule an appointment.   Please call the Suicide and Crisis Lifeline: 988 if you are experiencing a Mental Health or Behavioral Health Crisis or need someone to talk to.  [name] [place]  [facility], [facility] Health Nurse Care Coordinator  Direct Dial: [phone] Website: [name]

## 2024-06-16 ENCOUNTER — Other Ambulatory Visit: Payer: Self-pay | Admitting: Nurse Practitioner

## 2024-06-16 DIAGNOSIS — E66811 Obesity, class 1: Secondary | ICD-10-CM

## 2024-06-17 ENCOUNTER — Telehealth: Payer: Self-pay

## 2024-06-17 NOTE — Patient Instructions (Signed)
 [name] - I am sorry I was unable to reach you today for our scheduled appointment. I work with [name], FNP and am calling to support your healthcare needs. Please contact me at [phone] at your earliest convenience. I look forward to speaking with you soon.   Thank you,   [name] [place]  [facility], [facility] Health Nurse Care Coordinator  Direct Dial: [phone] Website: [email or web address]

## 2024-07-02 ENCOUNTER — Other Ambulatory Visit: Payer: Self-pay | Admitting: Nurse Practitioner

## 2024-07-02 DIAGNOSIS — E6609 Other obesity due to excess calories: Secondary | ICD-10-CM

## 2024-07-04 ENCOUNTER — Encounter: Payer: Self-pay | Admitting: Nurse Practitioner

## 2024-07-05 ENCOUNTER — Other Ambulatory Visit: Payer: Self-pay

## 2024-07-05 DIAGNOSIS — E6609 Other obesity due to excess calories: Secondary | ICD-10-CM

## 2024-07-05 MED ORDER — WEGOVY 2.4 MG/0.75ML ~~LOC~~ SOAJ: 2.4000 mg | SUBCUTANEOUS | 1 refills | Status: DC

## 2024-07-26 NOTE — Progress Notes (Unsigned)
 LILLETTE Kristeen JINNY Gladis, CMA,acting as a neurosurgeon for Pamela Ada, FNP.,have documented all relevant documentation on the behalf of Pamela Ada, FNP,as directed by  Pamela Ada, FNP while in the presence of Pamela Ada, FNP.  Subjective:  Patient ID: Pamela Erickson , female    DOB: [birth year] , 34 y.o.   MRN: [id number]  Chief Complaint  Patient presents with   Obesity    .Patient presents today for a weight follow up, Patient reports compliance with medication. Patient denies any chest pain, SOB, or headaches. Patient has no concerns today.    She has been lifting weights and exercising 5-6 days a week. Her appetite is about the same. She is not craving the bad foods. Small breakfast and lunch and will have dinner. She is drinking more water.    Starting weight: 231 lbs  Starting date: 09/2023 Today's weight: 184 lbs Today's date: 07/27/2024  Total lbs lost to date: 47 lbs Total lbs lost since last in-office visit: 1 lbxz Wt Readings from Last 3 Encounters: 07/27/24 : 184 lb 3.2 oz (83.6 kg) 05/25/24 : 185 lb 3.2 oz (84 kg) 03/24/24 : 193 lb (87.5 kg)    Exercising: 4 days a week Water intake: adequate Diet: Healthy       Discussed the use of AI scribe software for clinical note transcription with the patient, who gave verbal consent to proceed.  History of Present Illness [name] is a 34 year old female who presents for a weight check while on Wegovy .  She has been taking Wegovy  since March 13th of last year, resulting in a weight reduction from 231 pounds to 184 pounds. Initially, she paid out of pocket for the medication at a special price of $300 for two months, instead of the regular $500. She is uncertain about current insurance coverage, particularly regarding Medicaid, and has not received confirmation from her pharmacy or healthcare providers.  Her exercise routine includes working out five to six days a week, with a recent addition of weight lifting. She maintains  her diet, which consists of small meals and snacks throughout the day, and has increased her water intake, aided by a new water bottle. Her appetite remains stable and she no longer craves unhealthy foods.  She has not had her vitamin D  levels checked recently.  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   Low vitamin D  level    Postpartum hemorrhage 09/17/2018   Sjogren's syndrome    Sjogren's syndrome with keratoconjunctivitis sicca 02/27/2018   Torn ACL (anterior cruciate ligament) 2008   Trichomonas infection    UTI (urinary tract infection)    Vaginal Pap smear, abnormal    May 2018; biopsy was normal     Family History  Problem Relation Age of Onset   Hypertension Mother    Hypertension Father    Diabetes Maternal Aunt    Hypertension Maternal Grandmother    Hypertension Maternal Grandfather     Current Medications[1]   Allergies[2]   Review of Systems  Constitutional: Negative.   Respiratory: Negative.    Cardiovascular: Negative.   Neurological: Negative.   Psychiatric/Behavioral: Negative.  Today's Vitals   07/27/24 1159  BP: 100/70  Pulse: 75  Temp: 98.2 F (36.8 C)  TempSrc: Oral  Weight: 184 lb 3.2 oz (83.6 kg)  Height: 5' 3 (1.6 m)  PainSc: 0-No pain   Body mass index is 32.63 kg/m.  Wt Readings from Last 3 Encounters:  07/27/24 184 lb 3.2 oz (83.6 kg)  05/25/24 185 lb 3.2 oz (84 kg)  03/24/24 193 lb (87.5 kg)      Objective:  Physical Exam Vitals and nursing note reviewed.  Constitutional:      General: She is not in acute distress.    Appearance: Normal appearance. She is obese.  Cardiovascular:     Rate and Rhythm: Normal rate and regular rhythm.     Pulses: Normal pulses.     Heart sounds:  Normal heart sounds. No murmur heard. Pulmonary:     Effort: Pulmonary effort is normal. No respiratory distress.     Breath sounds: Normal breath sounds. No wheezing.  Musculoskeletal:        General: Normal range of motion.  Neurological:     General: No focal deficit present.     Mental Status: She is alert and oriented to person, place, and time.     Cranial Nerves: No cranial nerve deficit.     Motor: No weakness.  Psychiatric:        Mood and Affect: Mood normal.        Behavior: Behavior normal.        Thought Content: Thought content normal.        Judgment: Judgment normal.      Assessment And Plan:   Assessment & Plan Class 1 obesity due to excess calories with body mass index (BMI) of 32.0 to 32.9 in adult, unspecified whether serious comorbidity present Obesity managed with Wegovy . Stable appetite, no cravings. Discussed insurance coverage and new Wegovy  oral option, will try to send the injection to the pharmacy she feels her insurance now covers again. - Continue Wegovy  at current dose. - Encouraged regular exercise, including strength training and cardio.  No orders of the defined types were placed in this encounter.   Return for keep same next.  Patient was given opportunity to ask questions. Patient verbalized understanding of the plan and was able to repeat key elements of the plan. All questions were answered to their satisfaction.   LILLETTE Pamela Ada, FNP, have reviewed all documentation for this visit. The documentation on 07/27/24 for the exam, diagnosis, procedures, and orders are all accurate and complete.   IF YOU HAVE BEEN REFERRED TO A SPECIALIST, IT MAY TAKE 1-2 WEEKS TO SCHEDULE/PROCESS THE REFERRAL. IF YOU HAVE NOT HEARD FROM US /SPECIALIST IN TWO WEEKS, PLEASE GIVE US  A CALL AT [phone].      [1]  Current Outpatient Medications:    albuterol  (VENTOLIN  HFA) 108 (90 Base) MCG/ACT inhaler, Inhale 1-2 puffs into the lungs every 6 (six) hours  as needed for wheezing or shortness of breath., Disp: 8 g, Rfl: 6   clobetasol  ointment (TEMOVATE ) 0.05 %, Apply 1 Application topically 2 (two) times daily. Apply twice a day until clear, Disp: 30 g, Rfl: 2   fexofenadine  (ALLEGRA  ALLERGY) 180 MG tablet, Take 1 tablet (180 mg total) by mouth daily., Disp: 30 tablet, Rfl: 2   hydroxychloroquine  (PLAQUENIL ) 200 MG tablet, Take 400 mg by mouth daily., Disp: , Rfl:    ibuprofen  (ADVIL ) 600 MG tablet, Take 1 tablet (600 mg total) by mouth  every 6 (six) hours as needed., Disp: 30 tablet, Rfl: 0   levonorgestrel -ethinyl estradiol (SRONYX) 0.1-20 MG-MCG tablet, Take 1 tablet by mouth daily., Disp: 28 tablet, Rfl: 11   valACYclovir  (VALTREX ) 500 MG tablet, Take 500 mg by mouth as needed., Disp: , Rfl:    Vitamin D , Ergocalciferol , (DRISDOL ) 1.25 MG (50000 UNIT) CAPS capsule, Take 1 capsule (50,000 Units total) by mouth every 7 (seven) days., Disp: 12 capsule, Rfl: 1   ferrous sulfate  325 (65 FE) MG tablet, Take 1 tablet (325 mg total) by mouth 2 (two) times daily with a meal. (Patient not taking: Reported on 07/27/2024), Disp: 60 tablet, Rfl: 1   Prenatal MV & Min w/FA-DHA (PRENATAL ADULT GUMMY/DHA/FA PO), Take by mouth daily. (Patient not taking: Reported on 07/27/2024), Disp: , Rfl:    semaglutide -weight management (WEGOVY ) 2.4 MG/0.75ML SOAJ SQ injection, Inject 2.4 mg into the skin once a week., Disp: 2 mL, Rfl: 1 [2] No Active Allergies

## 2024-07-27 ENCOUNTER — Ambulatory Visit: Admitting: Nurse Practitioner

## 2024-07-27 ENCOUNTER — Encounter: Payer: Self-pay | Admitting: Nurse Practitioner

## 2024-07-27 VITALS — BP 100/70 | HR 75 | Temp 98.2°F | Ht 63.0 in | Wt 184.2 lb

## 2024-07-27 DIAGNOSIS — E6609 Other obesity due to excess calories: Secondary | ICD-10-CM

## 2024-07-27 DIAGNOSIS — E66811 Obesity, class 1: Secondary | ICD-10-CM | POA: Diagnosis not present

## 2024-07-27 DIAGNOSIS — Z6832 Body mass index (BMI) 32.0-32.9, adult: Secondary | ICD-10-CM | POA: Diagnosis not present

## 2024-07-27 MED ORDER — WEGOVY 2.4 MG/0.75ML ~~LOC~~ SOAJ: 2.4000 mg | SUBCUTANEOUS | 1 refills | Status: DC

## 2024-08-13 ENCOUNTER — Other Ambulatory Visit: Payer: Self-pay

## 2024-08-13 DIAGNOSIS — E66811 Obesity, class 1: Secondary | ICD-10-CM

## 2024-08-13 MED ORDER — WEGOVY 2.4 MG/0.75ML ~~LOC~~ SOAJ: 2.4000 mg | SUBCUTANEOUS | 1 refills | Status: AC

## 2024-08-31 ENCOUNTER — Encounter: Payer: Medicaid Other | Admitting: Nurse Practitioner
